# Patient Record
Sex: Female | Born: 1945 | Race: White | Hispanic: No | Smoking: Former smoker
Health system: Southern US, Community
[De-identification: ages and names within clinical notes are randomized; demographics above are authoritative.]

## PROBLEM LIST (undated history)

## (undated) DIAGNOSIS — E079 Disorder of thyroid, unspecified: Secondary | ICD-10-CM

## (undated) DIAGNOSIS — R0609 Other forms of dyspnea: Principal | ICD-10-CM

## (undated) DIAGNOSIS — I6529 Occlusion and stenosis of unspecified carotid artery: Secondary | ICD-10-CM

## (undated) DIAGNOSIS — Z5189 Encounter for other specified aftercare: Secondary | ICD-10-CM

## (undated) DIAGNOSIS — I499 Cardiac arrhythmia, unspecified: Secondary | ICD-10-CM

## (undated) DIAGNOSIS — IMO0001 Reserved for inherently not codable concepts without codable children: Secondary | ICD-10-CM

## (undated) DIAGNOSIS — E039 Hypothyroidism, unspecified: Secondary | ICD-10-CM

## (undated) DIAGNOSIS — M1990 Unspecified osteoarthritis, unspecified site: Secondary | ICD-10-CM

## (undated) DIAGNOSIS — E785 Hyperlipidemia, unspecified: Secondary | ICD-10-CM

## (undated) DIAGNOSIS — I739 Peripheral vascular disease, unspecified: Secondary | ICD-10-CM

## (undated) DIAGNOSIS — K219 Gastro-esophageal reflux disease without esophagitis: Secondary | ICD-10-CM

## (undated) HISTORY — DX: Peripheral vascular disease, unspecified: I73.9

## (undated) HISTORY — DX: Occlusion and stenosis of unspecified carotid artery: I65.29

## (undated) HISTORY — PX: BREAST SURGERY: SHX581

## (undated) HISTORY — PX: CATARACT EXTRACTION: SUR2

## (undated) HISTORY — DX: Other forms of dyspnea: R06.09

## (undated) HISTORY — PX: EYE SURGERY: SHX253

## (undated) HISTORY — DX: Gastro-esophageal reflux disease without esophagitis: K21.9

## (undated) HISTORY — DX: Unspecified osteoarthritis, unspecified site: M19.90

## (undated) HISTORY — DX: Disorder of thyroid, unspecified: E07.9

## (undated) HISTORY — DX: Hyperlipidemia, unspecified: E78.5

## (undated) HISTORY — PX: APPENDECTOMY: SHX54

## (undated) HISTORY — DX: Cardiac arrhythmia, unspecified: I49.9

## (undated) HISTORY — PX: ABDOMINAL HYSTERECTOMY: SHX81

## (undated) HISTORY — PX: HAND SURGERY: SHX662

---

## 2000-02-16 ENCOUNTER — Encounter: Admission: RE | Admit: 2000-02-16 | Discharge: 2000-02-16 | Payer: Self-pay | Admitting: Obstetrics and Gynecology

## 2000-02-16 ENCOUNTER — Encounter: Payer: Self-pay | Admitting: Obstetrics and Gynecology

## 2001-06-06 ENCOUNTER — Encounter: Admission: RE | Admit: 2001-06-06 | Discharge: 2001-06-06 | Payer: Self-pay | Admitting: Obstetrics and Gynecology

## 2001-06-06 ENCOUNTER — Encounter: Payer: Self-pay | Admitting: Obstetrics and Gynecology

## 2001-06-12 ENCOUNTER — Encounter: Admission: RE | Admit: 2001-06-12 | Discharge: 2001-06-12 | Payer: Self-pay | Admitting: Obstetrics and Gynecology

## 2001-06-12 ENCOUNTER — Encounter: Payer: Self-pay | Admitting: Obstetrics and Gynecology

## 2002-06-16 ENCOUNTER — Encounter: Admission: RE | Admit: 2002-06-16 | Discharge: 2002-06-16 | Payer: Self-pay | Admitting: Obstetrics and Gynecology

## 2002-06-16 ENCOUNTER — Encounter: Payer: Self-pay | Admitting: Obstetrics and Gynecology

## 2003-07-16 ENCOUNTER — Ambulatory Visit (HOSPITAL_COMMUNITY): Admission: RE | Admit: 2003-07-16 | Discharge: 2003-07-16 | Payer: Self-pay | Admitting: Cardiology

## 2003-07-29 ENCOUNTER — Ambulatory Visit (HOSPITAL_BASED_OUTPATIENT_CLINIC_OR_DEPARTMENT_OTHER): Admission: RE | Admit: 2003-07-29 | Discharge: 2003-07-29 | Payer: Self-pay | Admitting: Surgery

## 2003-07-29 ENCOUNTER — Encounter (INDEPENDENT_AMBULATORY_CARE_PROVIDER_SITE_OTHER): Payer: Self-pay | Admitting: Specialist

## 2004-11-17 ENCOUNTER — Ambulatory Visit: Payer: Self-pay | Admitting: Endocrinology

## 2004-11-18 ENCOUNTER — Ambulatory Visit: Payer: Self-pay | Admitting: Endocrinology

## 2008-02-17 ENCOUNTER — Encounter: Admission: RE | Admit: 2008-02-17 | Discharge: 2008-02-17 | Payer: Self-pay | Admitting: Family Medicine

## 2008-03-13 ENCOUNTER — Ambulatory Visit: Payer: Self-pay | Admitting: Vascular Surgery

## 2008-03-19 ENCOUNTER — Ambulatory Visit: Payer: Self-pay | Admitting: Surgery

## 2008-03-19 ENCOUNTER — Ambulatory Visit (HOSPITAL_COMMUNITY): Admission: RE | Admit: 2008-03-19 | Discharge: 2008-03-19 | Payer: Self-pay | Admitting: Surgery

## 2008-04-07 ENCOUNTER — Inpatient Hospital Stay (HOSPITAL_COMMUNITY): Admission: RE | Admit: 2008-04-07 | Discharge: 2008-04-09 | Payer: Self-pay | Admitting: Vascular Surgery

## 2008-04-07 ENCOUNTER — Encounter: Payer: Self-pay | Admitting: Vascular Surgery

## 2008-04-08 ENCOUNTER — Encounter: Payer: Self-pay | Admitting: Vascular Surgery

## 2008-04-08 ENCOUNTER — Ambulatory Visit: Payer: Self-pay | Admitting: Surgery

## 2008-04-29 ENCOUNTER — Ambulatory Visit: Payer: Self-pay | Admitting: Vascular Surgery

## 2008-05-08 ENCOUNTER — Ambulatory Visit: Payer: Self-pay | Admitting: Vascular Surgery

## 2008-05-15 ENCOUNTER — Ambulatory Visit: Payer: Self-pay | Admitting: Vascular Surgery

## 2008-08-21 ENCOUNTER — Ambulatory Visit: Payer: Self-pay | Admitting: Vascular Surgery

## 2008-09-28 ENCOUNTER — Ambulatory Visit: Payer: Self-pay | Admitting: Vascular Surgery

## 2008-09-28 ENCOUNTER — Inpatient Hospital Stay (HOSPITAL_COMMUNITY): Admission: RE | Admit: 2008-09-28 | Discharge: 2008-09-30 | Payer: Self-pay | Admitting: Vascular Surgery

## 2008-09-28 ENCOUNTER — Encounter: Payer: Self-pay | Admitting: Vascular Surgery

## 2008-10-09 ENCOUNTER — Ambulatory Visit: Payer: Self-pay | Admitting: Vascular Surgery

## 2008-10-29 ENCOUNTER — Ambulatory Visit: Payer: Self-pay | Admitting: Vascular Surgery

## 2008-11-06 ENCOUNTER — Ambulatory Visit: Payer: Self-pay | Admitting: Vascular Surgery

## 2009-02-05 ENCOUNTER — Ambulatory Visit: Payer: Self-pay | Admitting: Vascular Surgery

## 2009-02-11 ENCOUNTER — Ambulatory Visit (HOSPITAL_COMMUNITY): Admission: RE | Admit: 2009-02-11 | Discharge: 2009-02-11 | Payer: Self-pay | Admitting: Surgery

## 2009-02-11 ENCOUNTER — Ambulatory Visit: Payer: Self-pay | Admitting: Surgery

## 2009-03-02 ENCOUNTER — Inpatient Hospital Stay (HOSPITAL_COMMUNITY): Admission: RE | Admit: 2009-03-02 | Discharge: 2009-03-08 | Payer: Self-pay | Admitting: Vascular Surgery

## 2009-03-02 HISTORY — PX: PR VEIN BYPASS GRAFT,AORTO-FEM-POP: 35551

## 2009-03-17 ENCOUNTER — Ambulatory Visit: Payer: Self-pay | Admitting: Vascular Surgery

## 2009-04-16 ENCOUNTER — Ambulatory Visit: Payer: Self-pay | Admitting: Vascular Surgery

## 2009-07-09 ENCOUNTER — Ambulatory Visit: Payer: Self-pay | Admitting: Vascular Surgery

## 2009-10-08 ENCOUNTER — Ambulatory Visit: Payer: Self-pay | Admitting: Vascular Surgery

## 2010-01-14 ENCOUNTER — Ambulatory Visit: Payer: Self-pay | Admitting: Vascular Surgery

## 2010-09-07 ENCOUNTER — Ambulatory Visit: Payer: Self-pay | Admitting: Vascular Surgery

## 2011-02-07 LAB — BASIC METABOLIC PANEL
BUN: 11 mg/dL (ref 6–23)
BUN: 2 mg/dL — ABNORMAL LOW (ref 6–23)
BUN: 6 mg/dL (ref 6–23)
CO2: 29 mEq/L (ref 19–32)
Calcium: 6.7 mg/dL — ABNORMAL LOW (ref 8.4–10.5)
Chloride: 106 mEq/L (ref 96–112)
Creatinine, Ser: 0.74 mg/dL (ref 0.4–1.2)
Creatinine, Ser: 0.8 mg/dL (ref 0.4–1.2)
GFR calc Af Amer: >60 mL/min (ref >60)
GFR calc non Af Amer: >60 mL/min (ref >60)
GFR calc non Af Amer: >60 mL/min (ref >60)
Glucose, Bld: 125 mg/dL — ABNORMAL HIGH (ref 70–99)
Glucose, Bld: 251 mg/dL — ABNORMAL HIGH (ref 70–99)
Glucose, Bld: 93 mg/dL (ref 70–99)
Potassium: 3.8 mEq/L (ref 3.5–5.1)
Potassium: 4.6 mEq/L (ref 3.5–5.1)
Sodium: 138 mEq/L (ref 135–145)

## 2011-02-07 LAB — POCT I-STAT 7, (LYTES, BLD GAS, ICA,H+H)
Acid-base deficit: 4 mmol/L — ABNORMAL HIGH (ref 0.0–2.0)
Bicarbonate: 22 mEq/L (ref 20.0–24.0)
Calcium, Ion: 1.06 mmol/L — ABNORMAL LOW (ref 1.12–1.32)
Calcium, Ion: 1.07 mmol/L — ABNORMAL LOW (ref 1.12–1.32)
HCT: 28 % — ABNORMAL LOW (ref 36.0–46.0)
Hemoglobin: 9.5 g/dL — ABNORMAL LOW (ref 12.0–15.0)
Hemoglobin: 9.9 g/dL — ABNORMAL LOW (ref 12.0–15.0)
O2 Saturation: 100 %
O2 Saturation: 100 %
Patient temperature: 35.4
Patient temperature: 35.9
Potassium: 4.6 mEq/L (ref 3.5–5.1)
Potassium: 4.9 mEq/L (ref 3.5–5.1)
Sodium: 130 mEq/L — ABNORMAL LOW (ref 135–145)
Sodium: 135 mEq/L (ref 135–145)
TCO2: 23 mmol/L (ref 0–100)
pCO2 arterial: 37.9 mmHg (ref 35.0–45.0)
pCO2 arterial: 42.9 mmHg (ref 35.0–45.0)
pH, Arterial: 7.338 — ABNORMAL LOW (ref 7.350–7.400)
pH, Arterial: 7.365 (ref 7.350–7.400)
pO2, Arterial: 460 mmHg — ABNORMAL HIGH (ref 80.0–100.0)

## 2011-02-07 LAB — COMPREHENSIVE METABOLIC PANEL
AST: 27 U/L (ref 0–37)
Alkaline Phosphatase: 38 U/L — ABNORMAL LOW (ref 39–117)
BUN: 10 mg/dL (ref 6–23)
BUN: 14 mg/dL (ref 6–23)
CO2: 23 mEq/L (ref 19–32)
CO2: 24 mEq/L (ref 19–32)
Calcium: 9.1 mg/dL (ref 8.4–10.5)
Chloride: 103 mEq/L (ref 96–112)
Chloride: 104 mEq/L (ref 96–112)
Creatinine, Ser: 0.76 mg/dL (ref 0.4–1.2)
GFR calc Af Amer: >60 mL/min (ref >60)
GFR calc non Af Amer: >60 mL/min (ref >60)
GFR calc non Af Amer: >60 mL/min (ref >60)
Glucose, Bld: 110 mg/dL — ABNORMAL HIGH (ref 70–99)
Glucose, Bld: 189 mg/dL — ABNORMAL HIGH (ref 70–99)
Potassium: 4.1 mEq/L (ref 3.5–5.1)
Total Bilirubin: 0.4 mg/dL (ref 0.3–1.2)
Total Bilirubin: 0.6 mg/dL (ref 0.3–1.2)

## 2011-02-07 LAB — BLOOD GAS, ARTERIAL
Acid-base deficit: 1.2 mmol/L (ref 0.0–2.0)
Acid-base deficit: 3.2 mmol/L — ABNORMAL HIGH (ref 0.0–2.0)
Bicarbonate: 22.7 mEq/L (ref 20.0–24.0)
Drawn by: 249101
O2 Content: 4 L/min
TCO2: 23.9 mmol/L (ref 0–100)
pCO2 arterial: 36.5 mmHg (ref 35.0–45.0)
pCO2 arterial: 43.6 mmHg (ref 35.0–45.0)
pH, Arterial: 7.412 — ABNORMAL HIGH (ref 7.350–7.400)
pO2, Arterial: 64.4 mmHg — ABNORMAL LOW (ref 80.0–100.0)

## 2011-02-07 LAB — GLUCOSE, CAPILLARY: Glucose-Capillary: 111 mg/dL — ABNORMAL HIGH (ref 70–99)

## 2011-02-07 LAB — CBC
HCT: 27 % — ABNORMAL LOW (ref 36.0–46.0)
HCT: 30.7 % — ABNORMAL LOW (ref 36.0–46.0)
HCT: 34 % — ABNORMAL LOW (ref 36.0–46.0)
HCT: 35 % — ABNORMAL LOW (ref 36.0–46.0)
HCT: 41 % (ref 36.0–46.0)
Hemoglobin: 11.9 g/dL — ABNORMAL LOW (ref 12.0–15.0)
Hemoglobin: 12.3 g/dL (ref 12.0–15.0)
Hemoglobin: 14.3 g/dL (ref 12.0–15.0)
Hemoglobin: 9.3 g/dL — ABNORMAL LOW (ref 12.0–15.0)
MCHC: 34.6 g/dL (ref 30.0–36.0)
MCHC: 34.9 g/dL (ref 30.0–36.0)
MCHC: 35 g/dL (ref 30.0–36.0)
MCV: 87.6 fL (ref 78.0–100.0)
MCV: 88.7 fL (ref 78.0–100.0)
MCV: 89.7 fL (ref 78.0–100.0)
MCV: 90.1 fL (ref 78.0–100.0)
Platelets: 141 10*3/uL — ABNORMAL LOW (ref 150–400)
Platelets: 234 10*3/uL (ref 150–400)
Platelets: 97 10*3/uL — ABNORMAL LOW (ref 150–400)
RBC: 3.45 MIL/uL — ABNORMAL LOW (ref 3.87–5.11)
RBC: 4.62 MIL/uL (ref 3.87–5.11)
RDW: 14 % (ref 11.5–15.5)
RDW: 14.5 % (ref 11.5–15.5)
RDW: 14.5 % (ref 11.5–15.5)
RDW: 14.5 % (ref 11.5–15.5)
WBC: 10.3 10*3/uL (ref 4.0–10.5)
WBC: 11.2 10*3/uL — ABNORMAL HIGH (ref 4.0–10.5)
WBC: 13.2 10*3/uL — ABNORMAL HIGH (ref 4.0–10.5)
WBC: 7.6 10*3/uL (ref 4.0–10.5)

## 2011-02-07 LAB — MAGNESIUM: Magnesium: 1.4 mg/dL — ABNORMAL LOW (ref 1.5–2.5)

## 2011-02-07 LAB — URINALYSIS, ROUTINE W REFLEX MICROSCOPIC
Bilirubin Urine: NEGATIVE
Ketones, ur: NEGATIVE mg/dL
Nitrite: NEGATIVE
Protein, ur: NEGATIVE mg/dL
pH: 7 (ref 5.0–8.0)

## 2011-02-07 LAB — TYPE AND SCREEN

## 2011-02-07 LAB — APTT: aPTT: 25 seconds (ref 24–37)

## 2011-02-07 LAB — PROTIME-INR
INR: 0.9 (ref 0.00–1.49)
Prothrombin Time: 12.4 seconds (ref 11.6–15.2)

## 2011-02-08 LAB — POCT I-STAT, CHEM 8
BUN: 16 mg/dL (ref 6–23)
Calcium, Ion: 1.17 mmol/L (ref 1.12–1.32)
Chloride: 106 mEq/L (ref 96–112)
HCT: 45 % (ref 36.0–46.0)
Sodium: 139 mEq/L (ref 135–145)
TCO2: 26 mmol/L (ref 0–100)

## 2011-03-14 NOTE — Assessment & Plan Note (Signed)
OFFICE VISIT   [name] [name], [name] [name]  DOB:  09-Sep-1946                                       02/05/2009  VHQIO#:[id number]   The patient presents today for continued follow-up of her lower  extremity arterial insufficiency.  She initially underwent arteriography  in May 2009.  She subsequently underwent bilateral femoral  endarterectomy with Dacron patch angioplasty in June 2009.  She had poor  results with this.  She went on to occlusion of her left common femoral  artery and was taken back to the operating room in November 2009 where  she underwent left common femoral artery resection followed by an  external iliac artery to profunda femoris bypass with a 6-mm thin-wall  Gore-Tex graft.  She continues to have limiting claudication.  She  denies any rest pain and she does not have any tissue loss.   She on physical exam is a well-developed, well-nourished white female  appearing stated age of 89.  Her blood pressure is 106/59, pulse 81,  respirations 18.  Her radial pulses are 2+.  Abdominal:  Reveals no  tenderness and no masses.  She does not have any prior abdominal scars.  She does have well-healed incisions in both groins with markedly  diminished femoral pulses.  She has absent distal pulses and no tissue  loss.  She is not able to tolerate her current level of claudication.  She reports she is usually quite active and is unable to walk or do her  things that she enjoys.  I explained that we would need to repeat her  arteriogram to determine best treatment options.  I explained that she  obviously had early failure of the femoral procedures.  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  I explained that  following arteriography we would recommend treatment, but that this may  include either aortobifemoral bypass grafting or staged bilateral  femoral-to-popliteal  bypasses.  She understands this and wishes to  proceed with elective arteriogram followed by elective surgery.  We have  scheduled her arteriogram with Dr. Durene Cal on 02/11/2009 at [facility].   Larina Earthly, M.D.  Electronically Signed   TFE/MEDQ  D:  02/05/2009  [name]:  02/08/2009  Job:  [id number]   cc:   Quita Skye. Artis Flock, M.D.

## 2011-03-14 NOTE — Discharge Summary (Signed)
[name] [name], [name] [name]                  ACCOUNT NO.:  [id number]   MEDICAL RECORD NO.:  [id number]          PATIENT TYPE:  INP   LOCATION:  2021                         FACILITY:  [facility]   PHYSICIAN:  Larina Earthly, M.D.    DATE OF BIRTH:  Apr 03, 1946   DATE OF ADMISSION:  04/07/2008  DATE OF DISCHARGE:  04/09/2008                               DISCHARGE SUMMARY   DISCHARGE DIAGNOSIS:  Bilateral lower extremity claudication, left  greater than right.   PROCEDURE PERFORMED:  Bilateral femoral endarterectomies with Dacron  patch angioplasty on April 07, 2008.   COMPLICATIONS:  None.   DISCHARGE MEDICATIONS:  1. Synthroid 100 mcg p.o. daily.  2. Xanax 0.25 mg 1/2 tablet p.o. daily.  3. Percocet 5/325 1 p.o. q.4 h. p.r.n. pain.   DISPOSITION:  She is being discharged home in stable condition with her  wound feeling well.  She was instructed to clean her wounds with soap  and warm water.  She may increase her activity slowly.  She may walk  with assistance.  She may drive after 3 weeks.  She is to see Dr. Arbie Cookey  in 3 weeks for follow-up with ABIs and wound check.   CONDITION ON DISCHARGE:  Stable, improving.   BRIEF IDENTIFYING:  For statements of complete details, please refer to  typed history and physical.  Briefly, this very pleasant 65 year old  woman was referred to Dr. Arbie Cookey for bilateral lower extremity  claudication.  Dr. Arbie Cookey found her to have stenosis of her common  femoral arteries bilaterally.  He recommended endarterectomy.  She was  informed of the risks and benefits of the procedure and after careful  consideration, elected to proceed with surgery.   HOSPITAL COURSE:  Preoperative workup was completed as an outpatient.  She was brought in through same-day surgery and underwent the  aforementioned revascularization procedure.  For complete details,  please refer to the typed operative report.  The procedure was without  complication.  She was returned to the Post  Anesthesia Care Unit  extubated.  Following stabilization, she was transferred to a bed on a  surgical convalescent floor.   The rest of her hospital course was benign.  She was walking and  improving with physical therapy.  The wounds were healing well.  She was  felt stable and discharged home on April 09, 2008.      Wilmon Arms, PA      Larina Earthly, M.D.  Electronically Signed    KEL/MEDQ  D:  04/09/2008  T:  04/10/2008  Job:  [id number]

## 2011-03-14 NOTE — Assessment & Plan Note (Signed)
OFFICE VISIT   ANELY, SPIEWAK T  DOB:  1945-11-21                                       03/17/2009  BJYNW#:[id number]   The patient presents today for followup of her aortobifemoral bypass and  bilateral femoral-popliteal bypasses with Gore-Tex graft, all done on  03/02/2009 for bilateral lower extremity arterial insufficiency and  severe claudication.  She did well in the hospital and is here today for  followup.  She had had some serous drainage discharge from her right  groin and this has now resolved.  Her abdominal, femoral and popliteal  incisions are all healing quite nicely.  They are all closed with  subcuticular closure.  She has 2-3+ dorsalis pedis pulses bilaterally.  She does have slow return of appetite and is having normal return of her  bowel function.  I am pleased with her initial followup and plan to see  her again in 1 month for continued followup.   Larina Earthly, M.D.  Electronically Signed   TFE/MEDQ  D:  03/17/2009  T:  03/18/2009  Job:  [id number]   cc:   Quita Skye. Artis Flock, M.D.

## 2011-03-14 NOTE — Procedures (Signed)
BYPASS GRAFT EVALUATION   INDICATION:  Bilateral bypass grafts.   HISTORY:  Diabetes:  No.  Cardiac:  No.  Hypertension:  No.  Smoking:  Previous.  Previous Surgery:  Aortobifem and bilateral fem-pop bypass grafts on  03/02/09.   SINGLE LEVEL ARTERIAL EXAM                               RIGHT              LEFT  Brachial:                    172                170  Anterior tibial:             176                198  Posterior tibial:            182                198  Peroneal:  Ankle/brachial index:        1.06               1.15   PREVIOUS ABI:  Date: 07/09/09  RIGHT:  1.18  LEFT:  1.25   LOWER EXTREMITY BYPASS GRAFT DUPLEX EXAM:   DUPLEX:  Biphasic Doppler waveforms noted throughout the bilateral lower  extremity bypass grafts and their native vessels with no increase in  velocities.   IMPRESSION:  1. Patent bilateral femoropopliteal bypass grafts with no evidence of      stenosis.  2. Stable bilateral ankle brachial indices.   ___________________________________________  Larina Earthly, M.D.   CH/MEDQ  D:  10/08/2009  T:  10/09/2009  Job:  [id number]

## 2011-03-14 NOTE — Assessment & Plan Note (Signed)
OFFICE VISIT   Elkton, COHICK T  DOB:  19-Apr-1946                                       11/06/2008  EAVWU#:[id number]   Patient presents today for continued followup of her lower extremity  arterial insufficiency.  She has now healed her left groin incision.  She does continue to have bilateral lower extremity claudication,  perhaps somewhat worse on the right than on the left.  She does not have  any true buttock claudication.   PHYSICAL EXAMINATION:  Both groin incisions are well healed.  She did  have a Vicryl suture at the lower pole of her right leg, and I have  removed this.  She does not have palpable distal pulses.  She does have  known superficial femoral artery occlusions bilaterally.   I discussed the options with patient.  I explained that as I had in the  past, that her next treatment would be re-arteriography and probable  aortobifemoral bypass grafting.  She reports that she is not happy with  her current level of claudication but currently is not having any limb-  threatening problems and wishes to continue on observation only.  I plan  to see her again in 3 months for continued discussion.   Larina Earthly, M.D.  Electronically Signed   TFE/MEDQ  D:  11/06/2008  T:  11/09/2008  Job:  [id number]

## 2011-03-14 NOTE — Procedures (Signed)
CAROTID DUPLEX EXAM   INDICATION:  Left carotid bruit.   HISTORY:  Diabetes:  No.  Cardiac:  No.  Hypertension:  No.  Smoking:  Quit about seven years ago.  Previous Surgery:  Bilateral carotid endarterectomy.  CV History:  Amaurosis Fugax No, Paresthesias No, Hemiparesis No.                                       RIGHT             LEFT  Brachial systolic pressure:         130               140  Brachial Doppler waveforms:         Biphasic          Biphasic  Vertebral direction of flow:        Antegrade         Antegrade  DUPLEX VELOCITIES (cm/sec)  CCA peak systolic                   85                52  ECA peak systolic                   154               Occluded  ICA peak systolic                   168               146  ICA end diastolic                   32                35  PLAQUE MORPHOLOGY:                  Calcified         Calcified  PLAQUE AMOUNT:                      Moderate          Moderate  PLAQUE LOCATION:                    ICA, ECA          ICA, ECA    IMPRESSION:  1. 40-59% stenosis noted in bilateral internal carotid arteries.  2. Antegrade bilateral vertebral arteries.     ___________________________________________  Larina Earthly, M.D.   MG/MEDQ  D:  05/15/2008  T:  05/15/2008  Job:  [id number]

## 2011-03-14 NOTE — Assessment & Plan Note (Signed)
OFFICE VISIT   [name] [name], [name] [name]  DOB:  03/21/1946                                       05/15/2008  ZOXWR#:[id number]   The patient presents today for follow-up of her recent bilateral femoral  endarterectomy with Dacron patch angioplasty for limiting claudication.  She has had no difficulty with her incisions, things are feeling quite  nicely, she did have exposed Vicryl suture and this was removed  bilaterally.  She reports that she has had resolution of claudication in  her right leg, she still continues to have some calf claudication on the  left and some hip discomfort as well.  She is walking much better and  will continue to monitor this.  She does have concern regarding possible  other peripheral vascular occlusive disease.  She does have a soft left  carotid bruit, no bruit on the right and did undergo carotid duplex  today.  This shows occlusion of her external carotid on the left and 40-  59% stenosis in her bilateral internal carotid arteries.  I discussed  this with this with the patient, explained there is no significance of  her external carotid occlusion of the left and that we would recommend  yearly monitoring of her internal carotid artery stenosis.  I reviewed  symptoms of carotid disease with her and she will notify the  should she  have any symptoms.  Otherwise, we will see her in 3 months for follow-up  of her endarterectomy of her bilateral common femoral artery and then  yearly for duplex follow-up.   Larina Earthly, M.D.  Electronically Signed   TFE/MEDQ  D:  05/15/2008  [name]:  05/18/2008  Job:  [id number]   cc:   Quita Skye. Artis Flock, M.D.

## 2011-03-14 NOTE — Assessment & Plan Note (Signed)
OFFICE VISIT   [name] [name], [name] [name]  DOB:  30-Jan-1946                                       07/09/2009  AOZHY#:[id number]   The patient presents today for followup of her extensive prior surgery.  In May of 2010 she underwent aortobifemoral bypass and bilateral femoral  to above knee popliteal bypass with Propaten Gore-Tex graft.  She has  done extremely well following this.  She reports that she is walking  without any difficulty whatsoever and just recently took her grandson to  [place].   PHYSICAL EXAMINATION:  On physical examination her blood pressure is  116/59, pulse 70, respirations 18.  Her abdominal incision is well-  healed.  She does have somewhat of a hypertrophic scar and I explained  that this will turn from the pink color of a relatively fresh incision  to white and it will be much less noticeable.  Her popliteal incisions  and groin incisions are healed with 2+ femoral and popliteal and 2 to 3+  dorsalis pedis pulses bilaterally.   She underwent noninvasive vascular laboratory studies in our office and  these reveal normal ankle arm indices bilaterally up from 0.43 on the  right and 0.34 on the left.  She is quite pleased with her result as am  I.  We will continue to follow her in the noninvasive vascular  laboratory for continued followup.  She will see [place] and she will notify  should she develop any new difficulty.   Larina Earthly, M.D.  Electronically Signed   TFE/MEDQ  D:  07/09/2009  T:  07/09/2009  Job:  [id number]   cc:   Larina Earthly, M.D.

## 2011-03-14 NOTE — Op Note (Signed)
NAMEESMIRNA, RAVAN                  ACCOUNT NO.:  [id number]   MEDICAL RECORD NO.:  [id number]          PATIENT TYPE:  INP   LOCATION:  2309                         FACILITY:  [facility]   PHYSICIAN:  Larina Earthly, M.D.    DATE OF BIRTH:  1946/01/12   DATE OF PROCEDURE:  03/02/2009  DATE OF DISCHARGE:                               OPERATIVE REPORT   PREOPERATIVE DIAGNOSIS:  Bilateral lower extremity arterial  insufficiency.   POSTOPERATIVE DIAGNOSIS:  Bilateral lower extremity arterial  insufficiency.   PROCEDURES:  1. Aortobifemoral bypass with 12 x 7 Hemashield Dacron graft.  2. Bilateral femoral to above-knee popliteal artery bypasses with 6 mm      ringed Propaten Gore-Tex graft.   SURGEON:  Larina Earthly, MD   ASSISTANT:  Jerold Coombe, PA-C   ANESTHESIA:  General endotracheal.   COMPLICATIONS:  None.   DISPOSITION:  To recovery room, stable.   INDICATIONS FOR THE PROCEDURE:  The patient is an active, pleasant 65-  year-old white female with a long history of lower extremity arterial  insufficiency.  She had limiting claudication and had undergone  arteriography in May 2009, showing superficial femoral artery  occlusions, but also bilateral common femoral artery stenoses.  It was  recommended that she undergo common femoral endarterectomies for  attempted improvement of her claudication.  She did have a small  aortoiliac segments, at that time these were patent.  She underwent  bilateral endarterectomies and Dacron patches in June 2009.  She had  poor results with this, with a short-lived improvement in her  claudication.  In November, she was found have a markedly diminished  pulse in her left groin, was taken back to the operating room.  At that  time, she was found to have occlusion of the prior endarterectomy site  with intimal hyperplasia.  She had poor inflow and actually had  dissection and exposure of her external iliac well upon the inguinal  ligament.   She had a very small iliac artery and had a Gore-Tex graft  from her external iliac to a small profunda femoris artery.  She  continued to have progressive claudication and is unable to tolerate  this level.  She underwent recent repeat arteriography again showing  patent aortoiliac segment on the right and an occluded external iliac on  the left.  The arteries were quite small, and she had recurrent  narrowing at the common femoral on the right.  She does have a chronic  SFA occlusions with reconstitution of the above-knee popliteal artery  with normal popliteal and tibial vessels.  Her claudication was worse on  the left, but it is also significant on the right.  It was recommended  that she undergo aortobifemoral bypass for improvement of in flow since  this does appear to be a limiting issue and also I had recommended a  left femoral to popliteal bypass due to poor run off through the  profunda branch.  We had discussed the possible need for an eventual  right fem-pop bypass as well, but I had planned on  reserving this.   PROCEDURE IN DETAIL:  The patient was taken to the operating room,  placed in the supine position.  The area of the abdomen, both groins,  both legs, prepped and draped in usual sterile fashion.  Both incisions  over the common femoral arteries were reopened and carried down to  isolate the Gore-Tex graft on the left.  This was from up under the  inguinal ligament down to the anastomosis to the profunda.  The patient  did have some old profunda femoral artery run off.  On the right, the  prior Dacron patch was exposed and the common femoral, superficial  femoral, and profunda femoris arteries were isolated.  The patient had  extremely small profunda run off and also a small common femoral artery.  Next, the incision was made from the level of xiphoid to below the  umbilicus and carried down to the midline fascia with electrocautery.  Abdomen was entered and  Omni-Tract retractor was used for exposure.  The  transverse colon and omentum reflected superiorly and the small bowel  was selected to the right.  The duodenum was mobilized off the aorta.  The patient had a small caliber aorta, but had only mild-to-moderate  atherosclerotic changes.  The artery was then circled.  The aorta was  encircled at the level of the left renal vein.  Dissection was carried  down to the take off of the inferior mesenteric artery, and this was  exposed and the aorta was exposed at that area as well.  Next, the  bifurcation was exposed and a plane anterior to the iliac vessel was  taken, taking care to pass behind the level of the ureters bilaterally  down towards the groin.  Tunnels were created from the groin to the  aortic bifurcation bilaterally and umbilical tapes were passed through  these tunnels.  The patient was given 7000 units of heparin.  After  adequate circulation time, the aorta was occluded below the level of the  renal arteries with a Harken clamp.  The aorta was occluded just above  the inferior mesenteric artery with aortic DeBakey clamp.  The aorta was  transected above the aorta with a Bakey clamp.  The distal aorta was  oversewn with 3-0 Prolene suture using felt strips for reinforcement.  A  section of aorta was resected to give adequate space for an end-to-end  anastomosis of the proximal aorta.  A 12 x 7 Hemashield Dacron graft was  brought on to the field.  Using a felt strip for reinforcement, the  graft was sewn end-to-end to the aorta with a running 3-0 Prolene  suture.  The anastomosis was tested and found to be adequate.  Right and  left limbs were brought to the respective groins.  Attention was turned  first to the right groin.  The common femoral artery was occluded.  The  prior Dacron patch was reopened and sent down to the profunda femoris  artery.  The artery was a small caliber being less than 3 mm in  diameter.  There was back  bleeding.  The graft was cut to appropriate  length and was sewn end-to-end to the junction of the old patch, then in  the toe was taken down on to the profunda femoris artery.  Prior to  completion of the anastomosis, the usual flushing maneuvers were  undertaken.  Anastomosis was completed.  Due to the very poor quality  outflow of the profunda, the decision was made  to proceed with a fem-pop  bypass on the right as well.  On the left, the old interposition graft  of Gore-Tex was ligated proximally.  The graft had been sewn down to a  branch going in the profunda and both of these branches were controlled.  The artery had to be opened further down onto the profunda to find a  patent artery.  The left limb of the graft was sewn end-to-end to the  profundus femoris artery with a running 6-0 Prolene suture.  Again, the  usual flushing maneuvers were undertaken prior to completion of  anastomosis.  The anastomosis was completed and flow was restored to the  left leg.  Next, the left above-knee popliteal artery had already been  exposed through an incision at the medial approach.  The artery was of  good caliber with minimal calcification.  A tunnel had been created from  this part to the groin.  A 6 mm ringed thin wall Propaten Gore-Tex graft  was brought through the tunnel.  The proximal aortofemoral graft was  reoccluded and the graft was occluded just above the profunda  anastomosis.  No ellipse of graft was removed from the left limb of the  aorta femoral Dacron graft.  The Propaten graft was spatulated and sewn  end-to-side to the graft just above the profunda anastomosis with a  running 6-0 Prolene suture.  Anastomosis was tested and found to be  adequate.  The graft was brought through the prior created tunnel.  The  above-knee popliteal artery was occluded proximally and distally and was  opened with 11 blade and extended longitudinally with Potts scissors.  The graft was cut to the  appropriate length and was sewn end-to-side to  the above-knee popliteal artery with a running 6-0 Prolene suture.  Prior to completion of the anastomosis, the usual flushing maneuvers  were undertaken.  Anastomosis was completed with excellent Doppler  signal in the foot, which was dependent on the Gore-Tex graft.  Finally  a separate incision was made over the medial approach to the right above-  knee popliteal artery.  The artery was smaller than the left artery, but  was patent.  A tunnel was created from the level of the above-knee  popliteal artery to the level of the groin.  A 6-mm Propaten graft was  brought on to the field, again the limb of the graft of the right limb  of the aortofemoral graft was occluded proximally and distally and  ellipse was removed above the distal anastomosis.  The 6-mm Propaten  Gore-Tex graft was spatulated, and sewn end-to-side to the distal  aortofemoral graft with a running 6-0 Prolene suture.  The anastomosis  was tested and found to be adequate.  The graft was brought to the prior  created then to the level of above-knee popliteal artery.  The artery  was occluded proximally and distally and was opened with 11 blade,  extended with longitudinally with Potts scissors.  The Gore-Tex graft  was cut to the appropriate length, spatulated and sewn end-to-side of  the artery with a running 6-0 Prolene suture.  Three dilator passed  without resistance to the distal anastomosis.  Anastomosis completed,  now restoring flow to the right and left leg.  The patient had been  given 2 additional dosages of heparin during the procedure, and this was  then reversed with 100 units of protamine.  The wounds were irrigated  with saline.  The patient had excellent signals in  both feet with both  Propaten grafts patent.  The wounds were irrigated with saline.  Hemostasis with electrocautery.  The groin and popliteal wounds were  closed with several layers of 2-0 Vicryl  and the subcuticular tissue and  the skin was closed with 3-0 subcuticular Vicryl stitch.  The aortic  anastomosis was again exposed and found to be adequate.  The  retroperitoneum was closed to exclude the aortic graft with a running 2-  0 Vicryl suture.  The small bowel was run in its entirety and found to  be without injury, it was placed back in the pelvis.  The transverse  colon and omentum were placed over this.  The midline fascia was closed  with a #1 PDS suture beginning proximally and distally and tying in the  middle.  The skin was closed with 3-0 subcuticular Vicryl stitch.  Sterile dressing was applied, and the patient was taken to the recovery  room in stable condition.      Larina Earthly, M.D.  Electronically Signed     TFE/MEDQ  D:  03/02/2009  T:  03/03/2009  Job:  [id number]   cc:   Quita Skye. Artis Flock, M.D.

## 2011-03-14 NOTE — Procedures (Signed)
BYPASS GRAFT EVALUATION   INDICATION:  Bilateral lower extremity bypass grafts, patient states  claudication has vanished.   HISTORY:  Diabetes:  No.  Cardiac:  No.  Hypertension:  No.  Smoking:  Previous.  Previous Surgery:  Bilateral femoral-popliteal artery bypass grafts and  aortobifem 03/02/2009 by Dr. Arbie Cookey.   SINGLE LEVEL ARTERIAL EXAM                               RIGHT              LEFT  Brachial:                    154                159  Anterior tibial:             173                199  Posterior tibial:            187                188  Peroneal:  Ankle/brachial index:        1.18               1.25   PREVIOUS ABI:  Date:  10/09/2008  RIGHT:  0.43  LEFT:  0.34   LOWER EXTREMITY BYPASS GRAFT DUPLEX EXAM:   DUPLEX:  Doppler arterial waveforms appear triphasic proximal to, within  and distal to bilateral femoral-popliteal artery bypass grafts with no  evidence of stenosis.   IMPRESSION:  1. Patent bilateral femoral-popliteal artery bypass grafts.  2. Ankle brachial indices show significant improvement bilaterally      from preop study.        ___________________________________________  Larina Earthly, M.D.   AS/MEDQ  D:  07/09/2009  T:  07/09/2009  Job:  [id number]

## 2011-03-14 NOTE — Op Note (Signed)
NAMEDONNAMAE, MUILENBURG                  ACCOUNT NO.:  [id number]   MEDICAL RECORD NO.:  [id number]          PATIENT TYPE:  INP   LOCATION:  2011                         FACILITY:  [facility]   PHYSICIAN:  Larina Earthly, M.D.    DATE OF BIRTH:  15-Nov-1945   DATE OF PROCEDURE:  DATE OF DISCHARGE:                               OPERATIVE REPORT   PREOPERATIVE DIAGNOSIS:  Progressive claudication left leg.   POSTOPERATIVE DIAGNOSIS:  Progressive claudication left leg.   PROCEDURE:  Left common femoral artery resection followed by a left  external iliac artery to left profunda femoris artery bypass with 6 mm  thin wall Gore-Tex graft.   SURGEON:  Larina Earthly, MD   ASSISTANT:  Wilmon Arms, PA-C   ANESTHESIA:  General endotracheal.   COMPLICATIONS:  None.   DISPOSITION:  To recovery room stable.   INDICATION FOR PROCEDURE:  The patient is a 65 year old white female  with a history of prior lower extremity arterial insufficiency and  claudication.  She had undergone arteriography in February 2009 which  revealed subtotal occlusion of her right common femoral artery and  occlusion of her left common femoral artery.  She also had a complete  occlusion of her superficial femoral arteries bilaterally with above-  knee reconstitution from profunda collaterals and three-vessel runoff.  Options have been discussed at that time.  She did have intolerable  claudication and on June 2009 underwent bilateral femoral  endarterectomies and Dacron patch angioplasties.  The option of staged  endarterectomy and fem-pop had been discussed and was felt with  improvement of flow into her profunda collaterals that she would have  relieve her claudication.  She did well with this procedure and  initially had no recurrent claudication symptoms.  She has, however,  presented with recurrent claudication.  Discussed options with Ms. Staib.  She reports this was much worse on the left than on the right.  On  physical exam, she did seem to have 2+ femoral pulses and I had  recommended that we proceed with left fem-pop bypass.  I discussed the  option of repeating her arteriogram since this was just done on February  2009 and felt we could proceed without repeat arteriography.   PROCEDURE IN DETAIL:  The patient was taken to operating room and placed  in supine position where the left groin and left leg were prepped and  draped in the usual sterile fashion.  The saphenous vein was imaged with  ultrasound in room and this was found to be adequate for bypass.  The  location of the saphenous vein was marked on the skin.  A prior groin  incision was reopened and there was no femoral pulse.  The dissection  was extended deep down to the femoral graft and there was a dense amount  of scarring in the left groin.  The superficial femoral and profunda  femoris arteries were identified as was the common femoral artery.  The  prior Dacron patch was identified as well.  The patch was rather  constricted upon itself and there was  no flow in the common femoral  artery.  Dissection was extended up under the inguinal ligament.  The  patient did have a small external iliac artery as they had before and  there was no significant pulse in the external iliac artery even high  under the inguinal ligament above the patch.  A consideration for fem-  fem was undertaken but this was not felt to be a very attractive option  since she had already had right femoral endarterectomy and similar small  iliac artery on the right.  The patient was given 7000 units of  intravenous heparin.  The patch was opened and the common femoral artery  was completely occluded.  This extended up under the inguinal ligament  to the area of the external iliac and there was some old thrombus  present.  A 4 Fogarty catheter was able to be passed up into the level  of the aorta and old thrombus removed.  After a negative pass, this was  occluded  to high under the inguinal ligament with a Cooley clamp.  There  was adequate inflow.  The dissection was then extended down onto the  superficial femoral and profunda femoris artery.  The superficial  femoral artery was chronically occluded and it was ligated and divided.  Dissection was extended down onto the profunda femoris artery and there  was excellent backbleeding from the profundus femoris artery and the  artery was of good caliber.  Decision was made to resect the occluded  common femoral artery with an interposition graft of Gore-Tex.  The  patient had a small iliac artery and a 6-mm thin wall graft was chosen.  The graft was spatulated and sewn end-to-end to the external iliac  artery with a running 6-0 Prolene suture.  Several additional sutures  were required for hemostasis.  The graft itself was flushed with  heparinized saline and reoccluded.  Next, the graft was brought into  approximation with the profunda femoris artery.  The graft was  spatulated and sewn end-to-end to the profunda with a running 6-0  Prolene suture.  Prior to completion of the anastomosis, the usual  flushing maneuvers were undertaken.  Anastomosis was completed and flow  was restored to the profunda femoris artery.  Excellent Doppler flow was  noted through the profunda, also graft dependent Doppler flow was noted  at the ankle and the posterior tibial and anterior tibial arteries.  The  patient was given 50 mg of protamine to reverse the heparin.  Decision  was made not to proceed with a concomitant femoral-popliteal bypass  since her problem was clearly inflow currently.  The patient was given  50 mg of protamine.  The wounds were irrigated with saline.  Hemostasis  achieved with electrocautery.  The wounds were closed with several  layers of 2-0 Vicryl in the subcutaneous tissue and the skin was closed  with a 3-0 subcuticular Vicryl stitch.  Sterile dressing was applied.  The patient was taken to  the recovery room in stable condition.      Larina Earthly, M.D.  Electronically Signed     TFE/MEDQ  D:  09/28/2008  T:  09/28/2008  Job:  [id number]   cc:   Quita Skye. Artis Flock, M.D.

## 2011-03-14 NOTE — Op Note (Signed)
NAME:  [name] [name], [name] [name]                  ACCOUNT NO.:  [id number]   MEDICAL RECORD NO.:  [id number]          PATIENT TYPE:  AMB   LOCATION:  SDS                          FACILITY:  [facility]   PHYSICIAN:  Juleen China IV, MDDATE OF BIRTH:  1946-03-12   DATE OF PROCEDURE:  02/11/2009  DATE OF DISCHARGE:  02/11/2009                               OPERATIVE REPORT   PREPROCEDURE DIAGNOSIS:  Bilateral claudication.   POSTPROCEDURE DIAGNOSIS:  Bilateral claudication.   PROCEDURE PERFORMED:  1. Ultrasound access left brachial artery.  2. Abdominal aortogram.  3. Bilateral lower extremity runoff.   INDICATIONS:  This is a 65 year old female who has previously undergone  bilateral femoral endarterectomies.  She was taken back to the operating  room and underwent a left external iliac to profunda bypass graft.  She  is having lifestyle-limiting claudication.  She comes in for  arteriogram.   PROCEDURE:  The patient was identified in the holding area and taken to  room H, was placed supine on the table.  Bilateral groins were prepped  and draped in standard sterile fashion.  A time-out was called.  Ultrasound access was attempted in both groin;, however, was then able  to successfully access the artery.  With that reason, the left brachial  artery was prepped and draped.  The brachial artery was accessed under  ultrasound guidance with a micropuncture needle.  This was exchanged out  over a Bentson wire for a 4-French sheath.  Using a pigtail catheter and  Bentson wire, catheter was advanced into the aorta.  It was placed at  the level of L1 and abdominal aortogram was obtained.  The catheter was  then pushed out of the aortic bifurcation.  Bilateral runoff was  obtained.   FINDINGS:  Aortogram:  The visualized portions of the suprarenal  abdominal aorta showed minimal disease.  There is mild bilateral renal  artery stenoses.  Prominent arc of Riolan is present.  There are no  hemodynamically  significant lesions within the infrarenal abdominal  aorta.  Bilateral common iliac arteries are widely patent.  The right  external iliac artery is patent throughout its course.  The right  hypogastric artery is patent.  Left common iliac artery is widely  patent.  Left hypogastric artery is patent.  The left external iliac  artery is occluded.   Right lower extremity:  Multiple high-grade stenoses and approximately  95% are present within the right common femoral artery.  The right  superficial femoral artery is occluded.  The right profunda femoral  artery is patent.  There is reconstitution of the popliteal artery above  the knee.  All 3 runoff vessels are visualized; however, visualization  is limited due to proximal disease.   Left lower extremity:  The left common femoral artery is not visualized.  There are multiple profunda branches visualized coming off of the  hypogastric artery in the left groin.  The superficial femoral artery is  occluded.  There is reconstitution of the popliteal artery above the  knee.  Popliteal artery is patent throughout its course.  Three  runoff  vessels are identified; however, not well opacified due to proximal  disease.   After the above images were obtained, decision was made to terminate the  procedure.  Catheters and wires were removed.  The patient was taken to  the holding area for sheath pull.   IMPRESSION:  1. Occluded left external iliac artery bypass.  2. High-grade stenosis within the right common femoral artery.  Left      common femoral artery occlusion.  3. Bilateral reconstitution of the above-knee popliteal artery.  4. Limited visualization of the runoff vessels, however, they all      appeared to be patent.      Jorge Ny, MD  Electronically Signed     VWB/MEDQ  D:  02/11/2009  T:  02/12/2009  Job:  [id number]

## 2011-03-14 NOTE — Discharge Summary (Signed)
[name], [name] [name]                  ACCOUNT NO.:  [id number]   MEDICAL RECORD NO.:  [id number]          PATIENT TYPE:  INP   LOCATION:  2011                         FACILITY:  [facility]   PHYSICIAN:  Larina Earthly, M.D.    DATE OF BIRTH:  06/13/1946   DATE OF ADMISSION:  09/28/2008  DATE OF DISCHARGE:  09/30/2008                               DISCHARGE SUMMARY   FINAL DISCHARGE DIAGNOSES:  1. Left lower extremity ischemia resulting in progressive      claudication.  2. Hyperlipidemia.  3. Gastroesophageal reflux disease.  4. History of peripheral vascular disease, status post bilateral      femoral endarterectomies.   PROCEDURE PERFORMED:  Left common femoral artery resection followed by  left external iliac artery to left profunda femoral artery bypass with 6-  mm thin-walled Gore-Tex graft by Dr. Arbie Cookey on September 28, 2008.   COMPLICATIONS:  None.   DISCHARGE MEDICATIONS:  1. Synthroid 100 mcg p.o. daily.  2. Aspirin 81 mg p.o. daily.  3. She is given Darvocet-N 100 one p.o. q.4 h. p.r.n. pain, total #20      were given.   CONDITION ON DISCHARGE:  Stable.   DISPOSITION:  She is being discharged home in stable condition with her  wounds healing well.  She is given careful instructions regarding the  care of her wounds and her activity level.  She was given an appointment  to see Dr. Arbie Cookey in 2 weeks with ABIs and for followup.  The office will  arrange a visit.   BRIEF IDENTIFYING STATEMENT:  For complete details, please refer to the  typed history and physical.  Briefly, this very pleasant 65 year old  woman was evaluated by Dr. Arbie Cookey for progressive left lower extremity  claudication.  She had previously undergone bilateral common femoral  endarterectomies.  Dr. Arbie Cookey felt that she should undergo a femoral to  above-knee popliteal bypass grafting.  She was informed of the risks and  benefits of the procedure, and after careful consideration, she elected  to proceed with  surgery.   HOSPITAL COURSE:  Preoperative workup was completed as an outpatient.  She was brought in through Same-Day Surgery and underwent the  aforementioned procedure.  Of note, when she was evaluated  intraoperatively, she was found to have an occluded common femoral  artery, and the procedure was changed from a femoral to above-knee  bypass grafting procedure to the aforementioned procedure.  For complete  details, please refer the typed operative report.  The procedure was  without complications.  She was returned to the Post Anesthesia Care  Unit  extubated.  Following stabilization, she was transferred to a bed on a  surgical convalescent floor.  Her diet and activity level were increased  as tolerated.  On September 30, 2008, she was desirous of discharge.  She  was found to be in stable condition and subsequently discharged.      Wilmon Arms, PA      Larina Earthly, M.D.  Electronically Signed    KEL/MEDQ  D:  09/30/2008  T:  09/30/2008  Job:  [id number]   cc:   Larina Earthly, M.D.

## 2011-03-14 NOTE — Discharge Summary (Signed)
[name] [name], [name] [name]                  ACCOUNT NO.:  [id number]   MEDICAL RECORD NO.:  [id number]          PATIENT TYPE:  INP   LOCATION:  2041                         FACILITY:  [facility]   PHYSICIAN:  Larina Earthly, M.D.    DATE OF BIRTH:  [birth year]   DATE OF ADMISSION:  03/02/2009  DATE OF DISCHARGE:  03/08/2009                               DISCHARGE SUMMARY   FINAL DISCHARGE DIAGNOSES:  1. Lower extremity arterial insufficiency.  2. Dyslipidemia.  3. Hypertension.   PROCEDURES PERFORMED:  Aortobifemoral bypass with 12.5 x 7 Hemashield  Dacron graft and bilateral femoral to above-knee popliteal artery  bypasses using 6-mm ring Propaten Gore-Tex graft by Dr. Arbie Cookey on Mar 02, 2009.   COMPLICATIONS:  None.   CONDITION AT DISCHARGE:  Stable and improving.   DISCHARGE MEDICATIONS:  She is instructed to resume all previous  medications consisting of:  1. Synthroid 100 mcg p.o. daily.  2. She is given a prescription for Percocet 5/325 one p.o. q.4 h.      p.r.n. pain, total number of 40 were given.   DISPOSITION:  She is being discharged home in stable condition with her  wounds healing well.  She is given careful instructions regarding the  care of her wounds and her activity level.  She is given a return  appointment with Dr. Arbie Cookey in 2 weeks with ABIs, the office will  arrange.   BRIEF IDENTIFYING STATEMENT:  For complete details, please refer the  typed history and physical.  Briefly, this very pleasant 65 year old  woman has bilateral extremity arterial insufficiency.  Dr. Arbie Cookey  evaluated her and found her to have poor inflow into both iliac arteries  and recommended aortobifemoral bypass grafting with the possible femoral  to above-knee popliteal bypass grafting.  She was informed of the risks  and benefits of the procedure, and after careful consideration, elected  to proceed with surgery.   HOSPITAL COURSE:  Preoperative workup was completed as an outpatient.  She was  brought in through same-day surgery and underwent the  aforementioned revascularization.  For complete details, please refer  the typed operative report.  The procedure was without complications.  She was returned to a bed in a surgical intensive care unit in critical,  but hemodynamically stable condition.  Mechanical ventilation was  subsequently weaned and she was extubated.  On the first postoperative  day, she was able to be transferred to a bed on a surgical convalescent  floor.   Following her transfer, her diet and activity level was advanced as  tolerated.  By Mar 08, 2009, she was walking and improving with physical  therapy.  Her wounds are healing well.  She was tolerating a regular  diet.  She was desirous of discharge and was subsequently discharged  home.      Wilmon Arms, PA      Larina Earthly, M.D.  Electronically Signed    KEL/MEDQ  D:  03/08/2009  T:  03/08/2009  Job:  [id number]   cc:   Larina Earthly, M.D.

## 2011-03-14 NOTE — Op Note (Signed)
NAMEVERSA, CRATON                  ACCOUNT NO.:  [id number]   MEDICAL RECORD NO.:  [id number]          PATIENT TYPE:  INP   LOCATION:  3301                         FACILITY:  [facility]   PHYSICIAN:  Larina Earthly, M.D.    DATE OF BIRTH:  1946-09-08   DATE OF PROCEDURE:  DATE OF DISCHARGE:                               OPERATIVE REPORT   PREOPERATIVE DIAGNOSIS:  Bilateral lower extremity claudication, left  greater than right.   POSTOPERATIVE DIAGNOSIS:  Bilateral lower extremity claudication, left  greater than right.   PROCEDURE:  Bilateral femoral endarterectomies and Dacron patch  angioplasty.   SURGEON:  Larina Earthly, M.D.   ASSISTANT:  Nurse.   ANESTHESIA:  General endotracheal.   COMPLICATIONS:  None.   DISPOSITION:  To recovery room, stable.Marland Kitchen   PROCEDURE IN DETAIL:  The patient was taken to the operating room,  placed in supine position.  The area of the both groins prepped and  draped in the usual sterile fashion.  Incision was made over the  inguinal crease, over the right common femoral artery, and carried down  to isolate the common superficial femoral and profunda femoris arteries.  The superficial femoral artery was atretic and chronically occluded.  The patient had 2 large profunda branches.  These were controlled with  vessel loops.  Dissection was carried up under the inguinal ligament.  The patient did have an extensively calcified external iliac artery and  this was as far as could be reached through the groin.  Next, separate  incision was made over the left femoral artery and again carried down to  isolate the common superficial femoral and profunda femoris arteries.  The patient had no pulse in the external iliac artery, therefore the  exposure was continued further proximally up in the retroperitoneal  space through the groin incision.  The area of occlusion at the external  iliac artery was isolated and the artery was encircled above this.  Tributary  branches were controlled with 2-0 silk Potts ties.  Again, the  superficial femoral artery was chronically occluded on the left.  The  profunda femoris artery was encircled with the vessel loop for control.  The patient was given 7000 intravenous heparin.  After circulation time,  the left external iliac artery was occluded above the occlusion and the  profunda femoris artery was occluded with a vessel loop.  The artery was  opened on the common femoral artery and extended longitudinally through  the occlusion up onto the external iliac artery above the occlusion.  This area was endarterectomized of the plaque that was causing the  occlusion.  The common femoral artery itself was not endarterectomized.  There was a posterior plaque present, but this was adherent.  A long  Hemashield Dacron patch was brought on to field and sewn as a patch  angioplasty with a running 6-0 Prolene suture.  This began on the  external iliac artery and extended down onto the profunda femoris  artery.  Prior to completion of the closure, the usual flushing  maneuvers were undertaken.  Several additional sutures  were required for  hemostasis.  Flow was then restored to the left leg.  Next, attention  was turned to the right leg.  Again, the common femoral artery, external  iliac artery, was occluded up under the inguinal ligament and the  profunda femoris artery and other branches were controlled with vessel  loops.  The common femoral artery was opened with 11-blade.  This was  extended down onto the profunda and up onto to the common femoral artery  and external iliac junction.  An extensive endarterectomy was undertaken  to the left groin.  This was carried up under the inguinal ligament.  There was continued plaques as far as could be reached, but there did  not appear to be flow-limiting stenosis at this level.  There were good  distal endpoints in both profunda branches.  A Finesse Hemashield Dacron  patch  was sewn as a patch angioplasty with a running 6-0 Prolene suture.  Prior to completion of the anastomosis, the usual flushing maneuvers  were undertaken.  The anastomosis was completed.  Good flow was restored  through the profunda femoris arteries.  The patient was given 50 mg of  protamine to reverse the heparin.  The wounds were irrigated with  saline.  Hemostasis with electrocautery.  Wounds were closed with  several layers of 2-0 Vicryl in the subcutaneous tissue.  Skin was  closed with 3-0 subcuticular Vicryl stitch.  Sterile dressings applied  and the patient was taken to the recovery room in stable condition.      Larina Earthly, M.D.  Electronically Signed     TFE/MEDQ  D:  04/07/2008  T:  04/08/2008  Job:  [id number]   cc:   Quita Skye. Artis Flock, M.D.

## 2011-03-14 NOTE — Op Note (Signed)
NAME:  [name] [name], [name] [name]                  ACCOUNT NO.:  [id number]   MEDICAL RECORD NO.:  [id number]          PATIENT TYPE:  AMB   LOCATION:  SDS                          FACILITY:  [facility]   PHYSICIAN:  VDurene Cal IV, MDDATE OF BIRTH:  Sep 30, 1946   DATE OF PROCEDURE:  03/19/2008  DATE OF DISCHARGE:                               OPERATIVE REPORT   PREOPERATIVE DIAGNOSIS:  1. Bilateral claudication, left greater than right.   PROCEDURE PERFORMED:  1. Ultrasound-guided access right common femoral artery.  2. Abdominal aortogram.  3. Bilateral lower extremity runoff.   PROCEDURE:  The patient was identified in the holding area and taken to  room A.  She was placed supine on the table.  Bilateral groins were  prepped and draped in standard sterile fashion.  Time-out was called.  Right common femoral artery is evaluated by ultrasound and found to be  diseased but patent.  1% Lidocaine was used for local anesthesia.  On  ultrasound guidance, the right common femoral artery was accessed with  an 18-gauge needle.  A 0.035 Bentson wire was advanced in the retrograde  fashion into the abdominal aorta under fluoroscopic visualization.  Next, a 5-French sheath was placed.  Over the wire, Omni flush catheter  was placed at the level L1 and abdominal aortogram was obtained.  Next,  catheter was pulled down the aorta bifurcation, bilateral lower  extremity runoff, and pelvic angiograms were obtained.   FINDINGS:  Aortogram:  The visualized portions of suprarenal abdominal  aorta showed minimal disease.  There are single renal arteries  bilaterally.  The infrarenal abdominal aorta shows mild disease.  It is  small in caliber.  The common iliac arteries are widely patent.  Bilateral hypogastric artery is widely patent.  The right external iliac  artery is patent.  The left external iliac artery is occluded at its  distal extent.   Right lower extremity:  The right common femoral artery has an area  of  high-grade stenosis versus occlusion.  Pelvic collaterals have  reconstituted the superficial femoral artery.  Profunda femoral artery  is patent.  Popliteal artery is patent with minimal disease.  The  patient has three-vessel runoff.   Left lower extremity:  The left common femoral artery is occluded.  There is reconstitution of the superficial femoral and profunda femoral  arteries from iliac collaterals.  There is reconstitution of the  popliteal artery at its most proximal extent.  Popliteal artery appears  disease-free.  There is three-vessel runoff.   After the above images were obtained, decision made to stop the  procedure.  Catheters and wires were removed.  The patient's sheath was  removed.  Pressure was held.   IMPRESSION:  1. Bilateral common femoral artery disease, left greater than right      with 100% occlusion of the left common femoral artery.  2. Bilateral superficial femoral disease.           ______________________________  V. Charlena Cross, MD  Electronically Signed     VWB/MEDQ  D:  03/19/2008  T:  03/20/2008  Job:  [id number]

## 2011-03-14 NOTE — Procedures (Signed)
BYPASS GRAFT EVALUATION   INDICATION:  Follow up aorta bifem and bilateral femoral-popliteal  bypass graft.   HISTORY:  Diabetes:  No.  Cardiac:  No.  Hypertension:  No.  Smoking:  Previous.  Previous Surgery:  Aortobifemoral and bilateral femoral-popliteal bypass  graft, 03/02/09.   SINGLE LEVEL ARTERIAL EXAM                               RIGHT              LEFT  Brachial:                    163                156  Anterior tibial:             172                168  Posterior tibial:            157                175  Peroneal:  Ankle/brachial index:        1.06               1.07   PREVIOUS ABI:  Date: 10/08/09  RIGHT:  1.06  LEFT:  1.15   LOWER EXTREMITY BYPASS GRAFT DUPLEX EXAM:   DUPLEX:  Patent aortobifemoral and bilateral femoral popliteal bypass  grafts appear patent with biphasic waveforms.  There are elevated  velocities at the right distal anastomosis of 241 cm/s.   IMPRESSION:  1. Stable ankle brachial indices bilaterally.  2. Patent aortobifemoral and bilateral femoral-popliteal bypass grafts      with elevated velocities, as described above.         ___________________________________________  Larina Earthly, M.D.   CB/MEDQ  D:  01/14/2010  T:  01/14/2010  Job:  [id number]

## 2011-03-14 NOTE — H&P (Signed)
HISTORY AND PHYSICAL EXAMINATION   August 21, 2008   Re:  Nery, Yuliya T                  DOB:  1945-11-24   CHIEF COMPLAINT:  Left leg claudication. The patient is a 65-year white  female who is having increasingly severe left leg claudication greater  than right.  She is status post bilateral common femoral artery  endarterectomies and Dacron patch angioplasties on April 07, 2008.  Her  preoperative arteriogram at that time had shown bilateral superficial  artery occlusions, but also common femoral artery occlusions.  It was  recommended that she undergo correction of the common femoral artery  occlusion, with this patient this would relieve her claudication.  I did  explain at that time that with superficial femoral artery disease that  she would in all likelihood have tolerable claudication.  She initially  had good relief but has now had recurrent symptoms..  She has no new  medical difficulty.  She specifically does not have any severe coronary  disease.  She does have history of hypothyroidism.  She is not a  diabetic.  She is married, she does not smoke, having quit 7 years ago,  has a glass of wine per day.   REVIEW OF SYSTEMS:  Positive for esophageal reflux.   PHYSICAL EXAM:  A well-developed well-nourished white female appearing  stated age 65.  Carotid arteries without bruits bilaterally.  She has 2+  radial and 2+ femoral pulses, absent popliteal and distal pulses.  Heart:  Regular rate and rhythm.  Chest:  Clear.  Her ankle-arm indices  have dropped bilaterally since her last visit, with her ankle-arm  index  in the right of 0.52, on the left 0.45.  I discussed this at length with  The patient.  I explained that she currently does not have any evidence  of limb-threatening ischemia.  She reports that she is unable to  tolerate the current level of claudication, most particularly on her  left leg.  I explained that her next surgical option would be  left  femoral-to-popliteal bypass.  Arteriogram revealed patent popliteal  arteries bilaterally with normal three vessel runoff bilaterally.  I did  explain the potential limitation of femoral-to-popliteal bypass with the  risk of occlusion with 5-year patency anticipated approximately 70%.  I  explained the procedure of a left saphenous vein harvest for femoral-to-  popliteal bypass.  I did explain the potential for worsening ischemia  should she have occlusion of her fem-pop bypass.  She understands this  and wishes to proceed with surgery..  This has been scheduled at her  convenience on 09/28/2008 at [facility].   Larina Earthly, M.D.  Electronically Signed   TFE/MEDQ  D:  08/21/2008  T:  08/24/2008  Job:  [id number]   cc:   Quita Skye. Artis Flock, M.D.

## 2011-03-14 NOTE — Assessment & Plan Note (Signed)
OFFICE VISIT   [name] [name], [name] [name]  DOB:  [birth year]                                       04/16/2009  UEAVW#:[id number]   The patient presents today for continued followup of her aortobifemoral  and bilateral fem-pop bypass on 03/02/2009.  She is doing quite well.  She reports that she is walking with no claudication symptoms.  She does  continue to have some moderately reduced stamina.  Otherwise is doing  back to her baseline.   Her incisions are all healing quite nicely.  She does have slight amount  of exposed Vicryl suture in the right groin and this was removed.  She  has 2-3+ dorsalis pedis pulses bilaterally.  She will continue her  walking program and I plan to see her again in 3 months with noninvasive  vascular laboratory protocol followup.   Larina Earthly, M.D.  Electronically Signed   TFE/MEDQ  D:  04/16/2009  T:  04/16/2009  Job:  [id number]   cc:   Quita Skye. Artis Flock, M.D.

## 2011-03-14 NOTE — Assessment & Plan Note (Signed)
OFFICE VISIT   KHIA, DIETERICH  DOB:  01-18-1946                                       10/09/2008  IHKVQ#:[id number]   The patient presents today for follow-up of her recent left external  iliac-to-profunda bypass with a Gore-Tex graft on September 28, 2008.  She is healing nicely in her groin, does have the usual soreness.  She  has not been able to walk to really assess the level of claudication.  I  had a long discussion again with the patient.  She has a very difficult  situation.  She has hypoplastic iliac arteries.  I had attempted  initially to correct her claudication with bilateral femoral  endarterectomies and patches.  She on exploration on September 28, 2008,  had occlusion of her patch and I was able exposure her external iliac  upon her inguinal ligament with an interposition with Gore-Tex graft.  At that time, I elected not to proceed with a left fem-pop with vein due  to concern regarding adequate inflow with her small iliac arteries.  I  discussed this again at length with the patient today, explained that I  feel that her next step may be aortofemoral bypass.  At that time we  would probably proceed with left fem-pop bypass with vein.  She  understands this and we will see her again in 1 month for a recheck to  determine if she is having adequate relief of her claudication.   Larina Earthly, M.D.  Electronically Signed   TFE/MEDQ  D:  10/09/2008  T:  10/12/2008  Job:  [id number]   cc:   Quita Skye. Artis Flock, M.D.

## 2011-03-14 NOTE — Consult Note (Signed)
NEW PATIENT CONSULTATION   [name], [name] T  DOB:  [birth year]                                       03/13/2008  EAVWU#:[id number]   The patient presents today for evaluation for bilateral lower extremity  claudication.  She is an otherwise healthy 65 year old white female who  reports pain in her calves and pretibial area with walking.  She denies  any rest pain.  She reports that this has become more progressive and is  limiting her ability to exercise and do her activities of daily living.  She reports that if she really pushes the walking that this can extend  up into her thighs as well.  This is somewhat worse on the left than the  right.   PAST HISTORY:  Negative for any major medical difficulties.  She does  have a history of hypothyroidism.   FAMILY HISTORY:  Negative for premature atherosclerotic disease.   SOCIAL HISTORY:  She is widowed.  She works as a Veterinary surgeon.  She  does not smoke, having quit 7 years ago.  She drinks one glass of wine  per day.   REVIEW OF SYSTEMS:  She reports her weight as 130 pounds, she is 5 feet  1 inch tall.  CARDIAC:  Negative.  PULMONARY:  Negative.  GI:  Positive for reflux.  GU:  Negative.  VASCULAR:  She does have pain with walking.  NEUROLOGIC:  Negative.  ORTHOPEDIC:  Negative.  PSYCHIATRIC:  Negative.   ALLERGIES:  No known drug allergies.   MEDICATIONS:  1. Levothyroxine 100 mcg daily.  2. Cilostazol 100 mg daily.   PHYSICAL EXAM:  A well-developed, well-nourished white female appearing  stated age 65.  Blood pressure is 135/75, pulse 79, respirations 16.  She is grossly intact neurologically.  Her carotid arteries are without  bruits bilaterally.  She has 2+ radial pulses bilaterally.  She has 1+  femoral pulses bilaterally.  She does have a palpable 1+ right popliteal  and right dorsalis pedis, I do not feel a left popliteal or left  dorsalis pedis pulse.  She has no tissue loss in her lower  extremities.  Heart:  Regular rate and rhythm.  Chest:  Clear bilaterally.  Abdominal:  No masses, no tenderness and no bruits.   She did undergo noninvasive vascular laboratory studies at outlying  diagnostic center on 02/17/2008 and I have these for review.  Ankle arm  index is 0.74 on the right and 0.58 on the left.  I discussed this at  length with Ms. Strohmeier.  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  I have recommended  that we proceed with arteriography and potential angioplasty stent  treatment of this.  We have scheduled this at [facility] as an  outpatient on 03/19/2008 with Dr. Durene Cal.  We will make further  recommendations pending the arteriography and she understands we will  proceed with treatment if she has minimal lesion for treatment at the  time of her arteriogram.   Larina Earthly, M.D.  Electronically Signed   TFE/MEDQ  D:  03/13/2008  T:  03/16/2008  Job:  [id number]   cc:   Quita Skye. Artis Flock, M.D.

## 2011-03-17 NOTE — Op Note (Signed)
[name], [name] [name]                              ACCOUNT NO.:  [id number]   MEDICAL RECORD NO.:  [id number]                   PATIENT TYPE:  AMB   LOCATION:  DSC                                  FACILITY:  [facility]   PHYSICIAN:  Currie Paris, M.D.           DATE OF BIRTH:  [birth year]   DATE OF PROCEDURE:  07/29/2003  DATE OF DISCHARGE:                                 OPERATIVE REPORT   OFFICE MEDICAL RECORD NUMBER:  [id number]   PREOPERATIVE DIAGNOSIS:  Nipple discharge, left breast, possible intraductal  papilloma.   POSTOPERATIVE DIAGNOSIS:  Nipple discharge, left breast, possible  intraductal papilloma.   OPERATION:  Left breast ductal excision.   SURGEON:  Currie Paris, M.D.   ANESTHESIA:  General (LMA).   CLINICAL HISTORY:  This patient has a clear discharge from the left breast,  but on ultrasound, markedly dilated distal subareolar ducts with  questionable filling defect in one.   DESCRIPTION OF PROCEDURE:  The patient was seen in the holding area and had  no further questions.  She was taken to the operating room and after  satisfactory general anesthesia was obtained, the breast was prepped and  draped.  I initially attempted to cannulate the duct, which was at the 4  o'clock position, but could only get a tear duct probe to go a short  distance.  I then made a curvilinear incision at the areolar margin from  about the 3 o'clock position to the 6 o'clock position and then elevated the  skin of the areola towards the nipple proper.  In doing so, I found a large  dilated duct that had some very thick mucinous-type material draining in it.  I got around this and then divided it, detaching it from the undersurface of  the nipple itself.  There were some other dilated ducts nearby which I also  divided.  Once that was done, and using some traction, I then took a central  ductal excision of the tissue directly under the nipple.  I did see one  small nodule which  I thought corresponded with what had been seen on  ultrasound.  Once this was done, I controlled the bleeding with the cautery,  infiltrated some 0.25% plain Marcaine and closed in layers with 3-0 Vicryl,  4-0 Monocryl and Dermabond.  The patient tolerated the procedure well.  There were no operative complications and all counts were correct.                                               Currie Paris, M.D.    CJS/MEDQ  D:  07/29/2003  T:  07/29/2003  Job:  [id number]   cc:   Vale Haven. Andrey Campanile, M.D.  [address]  [place]  [place] [address]  Fax: [phone]   S. Kyra Manges, M.D.  [phone] [address]  [place]  [place] [address]  Fax: [phone]   Gregary Signs A. Everardo All, M.D. [facility]

## 2011-03-23 ENCOUNTER — Ambulatory Visit: Payer: Self-pay

## 2011-03-24 ENCOUNTER — Ambulatory Visit (INDEPENDENT_AMBULATORY_CARE_PROVIDER_SITE_OTHER): Payer: BC Managed Care – PPO

## 2011-03-24 ENCOUNTER — Encounter (INDEPENDENT_AMBULATORY_CARE_PROVIDER_SITE_OTHER): Payer: BC Managed Care – PPO

## 2011-03-24 DIAGNOSIS — Z48812 Encounter for surgical aftercare following surgery on the circulatory system: Secondary | ICD-10-CM

## 2011-03-24 DIAGNOSIS — I739 Peripheral vascular disease, unspecified: Secondary | ICD-10-CM

## 2011-03-24 NOTE — Assessment & Plan Note (Signed)
OFFICE VISIT  Kulpa, [name] [name] DOB:  1946/07/14                                       03/24/2011 IONGE#:[id number]  A patient of Dr. Bosie Helper.  Dr. Imogene Burn is the attending physician in the office today.  HISTORY OF PRESENT ILLNESS:  The patient is a 65 year old woman who had an aortobifemoral and bilateral femoral to above knee popliteal bypass with Propaten graft in May of 2010.  She returns today for her routine followup and ABIs.  The patient states that she is doing well and has no issues, is walking and doing all her activities without any pain in her legs.  She does have a small incisional hernia which she says is not painful and is not giving her any problems.  She has had no changes in her medical history from a year ago or her medications.  Duplex bypass graft scan shows pressures of 133 at the proximal anastomosis and 179 at the distal anastomosis of her Propaten graft on the right and on the left the proximal anastomosis 119 and the distal anastomosis of is 144.  Her ABIs on the right are 1.05 and on the left 1.08.  PHYSICAL EXAM:  This is a well-developed, well-nourished woman in no acute distress.  Her gait is normal.  Heart rate was 70, sats were 100% and respiratory rate was 12.  Her abdomen was soft and nontender.  She had a keloid type scar in the midline.  She had an area just above the umbilicus which was a small incisional hernia which was nontender.  She had normoactive bowel sounds.  Bilateral lower extremities, all her wounds were well-healed.  She had palpable DP and PT pulses bilaterally and both feet were warm and pink.  ASSESSMENT:  Normal ABIs status post aortobifemoral bilateral femoral- popliteal bypasses of 2 years' duration with good flow to both feet and palpable pulses.  She will follow up in 6 months with duplex scan and ABIs.  Della Goo, PA-C  Fransisco Hertz, MD Electronically Signed  RR/MEDQ  D:  03/24/2011  [name]:   03/24/2011  Job:  [id number]

## 2011-04-05 NOTE — Procedures (Unsigned)
BYPASS GRAFT EVALUATION  INDICATION:  Followup bypass graft placement.  HISTORY: Diabetes:  No. Cardiac:  No. Hypertension:  No. Smoking:  Previously. Previous Surgery:  Aortobifemoral, bilateral fem-pop 03/02/2009.  SINGLE LEVEL ARTERIAL EXAM                              RIGHT              LEFT Brachial:                    155                152 Anterior tibial:             163                167 Posterior tibial:            152                165 Peroneal: Ankle/brachial index:        1.05               1.08  PREVIOUS ABI:  Date:  01/14/2010  RIGHT:  1.06  LEFT:  1.07  LOWER EXTREMITY BYPASS GRAFT DUPLEX EXAM:  DUPLEX:  Patent bilateral femoral to popliteal bypass graft with velocity measurements noted on the following page.  IMPRESSION: 1. Bilateral ankle brachial indices are within normal limits and     stable from the previous study. 2. Patent bilateral femoral to popliteal bypass grafts with velocities     on the following worksheet.  ___________________________________________ Larina Earthly, M.D.  EM/MEDQ  D:  03/28/2011  T:  03/28/2011  Job:  [id number]

## 2011-07-26 LAB — PROTIME-INR
INR: 0.9
Prothrombin Time: 12.4

## 2011-07-26 LAB — CBC
Hemoglobin: 14.9
RBC: 5.08
WBC: 8.6

## 2011-07-26 LAB — BASIC METABOLIC PANEL
CO2: 27
Calcium: 9.2
Creatinine, Ser: 0.77
GFR calc Af Amer: >60
Sodium: 140

## 2011-07-26 LAB — APTT: aPTT: 24

## 2011-07-27 LAB — POCT I-STAT 4, (NA,K, GLUC, HGB,HCT)
Operator id: 114421
Potassium: 4.5

## 2011-07-27 LAB — COMPREHENSIVE METABOLIC PANEL
ALT: 22
AST: 21
Alkaline Phosphatase: 99
CO2: 26
Calcium: 9.2
Chloride: 104
GFR calc Af Amer: >60
GFR calc non Af Amer: 59 — ABNORMAL LOW
Glucose, Bld: 92
Potassium: 4.5
Sodium: 138

## 2011-07-27 LAB — URINALYSIS, ROUTINE W REFLEX MICROSCOPIC
Glucose, UA: NEGATIVE
Hgb urine dipstick: NEGATIVE
Ketones, ur: NEGATIVE
pH: 6.5

## 2011-07-27 LAB — CBC
Hemoglobin: 10.9 — ABNORMAL LOW
Hemoglobin: 14.7
MCHC: 35.5
MCV: 86.7
RBC: 3.55 — ABNORMAL LOW
RBC: 4.9
WBC: 8.1

## 2011-07-27 LAB — BASIC METABOLIC PANEL
CO2: 25
Chloride: 102
GFR calc Af Amer: >60
Sodium: 134 — ABNORMAL LOW

## 2011-07-27 LAB — TYPE AND SCREEN: ABO/RH(D): A POS

## 2011-07-27 LAB — ABO/RH: ABO/RH(D): A POS

## 2011-08-01 LAB — URINALYSIS, ROUTINE W REFLEX MICROSCOPIC
Bilirubin Urine: NEGATIVE
Ketones, ur: NEGATIVE mg/dL
Nitrite: NEGATIVE
pH: 5.5 (ref 5.0–8.0)

## 2011-08-01 LAB — COMPREHENSIVE METABOLIC PANEL
AST: 28 U/L (ref 0–37)
Albumin: 3.9 g/dL (ref 3.5–5.2)
Calcium: 8.9 mg/dL (ref 8.4–10.5)
Creatinine, Ser: 0.77 mg/dL (ref 0.4–1.2)
GFR calc Af Amer: >60 mL/min (ref >60)
GFR calc non Af Amer: >60 mL/min (ref >60)
Total Protein: 6.5 g/dL (ref 6.0–8.3)

## 2011-08-01 LAB — TYPE AND SCREEN

## 2011-08-01 LAB — CBC
MCHC: 34.7 g/dL (ref 30.0–36.0)
MCV: 86.7 fL (ref 78.0–100.0)
Platelets: 250 10*3/uL (ref 150–400)
RDW: 13.1 % (ref 11.5–15.5)

## 2011-08-01 LAB — APTT: aPTT: 24 seconds (ref 24–37)

## 2011-08-04 LAB — CBC
HCT: 33.5 % — ABNORMAL LOW (ref 36.0–46.0)
Hemoglobin: 11.4 g/dL — ABNORMAL LOW (ref 12.0–15.0)
MCV: 87.7 fL (ref 78.0–100.0)
Platelets: 173 10*3/uL (ref 150–400)
RBC: 3.83 MIL/uL — ABNORMAL LOW (ref 3.87–5.11)
WBC: 8.4 10*3/uL (ref 4.0–10.5)

## 2011-08-04 LAB — BASIC METABOLIC PANEL
BUN: 4 mg/dL — ABNORMAL LOW (ref 6–23)
Chloride: 104 mEq/L (ref 96–112)
Potassium: 3.8 mEq/L (ref 3.5–5.1)

## 2011-09-07 ENCOUNTER — Other Ambulatory Visit: Payer: Self-pay | Admitting: *Deleted

## 2011-09-07 ENCOUNTER — Other Ambulatory Visit (INDEPENDENT_AMBULATORY_CARE_PROVIDER_SITE_OTHER): Payer: Medicare Other | Admitting: *Deleted

## 2011-09-07 ENCOUNTER — Ambulatory Visit (INDEPENDENT_AMBULATORY_CARE_PROVIDER_SITE_OTHER): Payer: Medicare Other | Admitting: *Deleted

## 2011-09-07 ENCOUNTER — Encounter: Payer: Self-pay | Admitting: Vascular Surgery

## 2011-09-07 ENCOUNTER — Ambulatory Visit (INDEPENDENT_AMBULATORY_CARE_PROVIDER_SITE_OTHER): Payer: Medicare Other | Admitting: Vascular Surgery

## 2011-09-07 VITALS — BP 161/72 | HR 79 | Resp 20 | Ht 61.0 in | Wt 129.0 lb

## 2011-09-07 DIAGNOSIS — Z48812 Encounter for surgical aftercare following surgery on the circulatory system: Secondary | ICD-10-CM

## 2011-09-07 DIAGNOSIS — I70229 Atherosclerosis of native arteries of extremities with rest pain, unspecified extremity: Secondary | ICD-10-CM

## 2011-09-07 DIAGNOSIS — I739 Peripheral vascular disease, unspecified: Secondary | ICD-10-CM

## 2011-09-07 DIAGNOSIS — I70219 Atherosclerosis of native arteries of extremities with intermittent claudication, unspecified extremity: Secondary | ICD-10-CM

## 2011-09-07 DIAGNOSIS — T82898A Other specified complication of vascular prosthetic devices, implants and grafts, initial encounter: Secondary | ICD-10-CM

## 2011-09-07 MED ORDER — DEXTROSE 5 % IV SOLN: 1.5000 g | Freq: Once | INTRAVENOUS | Status: DC

## 2011-09-07 MED ORDER — SODIUM CHLORIDE 0.9 % IV SOLN
INTRAVENOUS | Status: DC
  Administered 2011-09-08: 09:00:00 via INTRAVENOUS

## 2011-09-07 MED ORDER — SODIUM CHLORIDE 0.9 % IV SOLN: INTRAVENOUS | Status: DC

## 2011-09-07 NOTE — Progress Notes (Signed)
[facility] [place] HISTORY AND PHYSICAL   History of Present Illness:  [name] who presents for evaluation of claudication.  The patient currently describes a cramping sensation in the right lower extremity. There is not a component of rest pain.  There is no history of ulcerations on the feet.  The patient had been doing well until about 5-7 days ago when after taking a 2 mile walk she noticed that she can only walk about one to 2 blocks reports been experiencing claudication in the right leg.  Prior to this she could walk unlimited distances. She has a complicated previous vascular history with bilateral femoral endarterectomies by Dr. Arbie Cookey in June of 2009 followed by a left external iliac to left profunda bypass in November of 2009. She subsequently had an aortobifemoral bypass with bilateral femoral to above-knee popliteal bypasses in May of 2010. Atherosclerotic risk factors and other medical problems include hyperlipidemia, hypertension. She quit smoking in 2002.  Past Medical History  Diagnosis Date  . Peripheral arterial disease   . Hyperlipidemia   . Hypertension   . GERD (gastroesophageal reflux disease)   . Thyroid disease     Hypothyroidism    Past Surgical History  Procedure Date  . Femoral endarterectomy 04-07-08    bilateral common femoral arteries  . Pr vein bypass graft,aorto-fem-pop 03/02/09    Aortobifemoral BPG, Bilateral fem- popliteal BPG    ROS:   General:  No weight loss, Fever, chills  HEENT: No recent headaches, no nasal bleeding, no visual changes, no sore throat  Neurologic: No dizziness, blackouts, seizures. No recent symptoms of stroke or mini- stroke. No recent episodes of slurred speech, or temporary blindness.  Cardiac: No recent episodes of chest pain/pressure, no shortness of breath at rest.  No shortness of breath with exertion.  Denies history of atrial fibrillation or irregular  heartbeat  Vascular: No history of rest pain in feet.  No history of claudication.  No history of non-healing ulcer, No history of DVT   Pulmonary: No home oxygen, no productive cough, no hemoptysis,  No asthma or wheezing  Musculoskeletal:  [ ]  Arthritis, [ ]  Low back pain,  [ ]  Joint pain  Hematologic:No history of hypercoagulable state.  No history of easy bleeding.  No history of anemia  Gastrointestinal: No hematochezia or melena,  No gastroesophageal reflux, no trouble swallowing  Urinary: [ ]  chronic Kidney disease, [ ]  on HD - [ ]  MWF or [ ]  TTHS, [ ]  Burning with urination, [ ]  Frequent urination, [ ]  Difficulty urinating;   Skin: No rashes  Psychological: No history of anxiety,  No history of depression  Social History History  Substance Use Topics  . Smoking status: Former Smoker    Quit date: 09/06/2001  . Smokeless tobacco: Not on file  . Alcohol Use: No    Family History History reviewed. No pertinent family history.  Allergies  Allergies  Allergen Reactions  . Morphine And Related Nausea And Vomiting     Current Outpatient Prescriptions  Medication Sig Dispense Refill  . aspirin 81 MG tablet Take 81 mg by mouth daily.        Marland Kitchen levothyroxine (SYNTHROID, LEVOTHROID) 112 MCG tablet Take 112 mcg by mouth daily.        . simvastatin (ZOCOR) 40 MG tablet Take 40 mg by mouth at bedtime.          Physical Examination  Filed Vitals:  09/07/11 1423  BP: 161/72  Pulse: 79  Resp: 20  Height: 5\' 1"  (1.549 m)  Weight: 129 lb (58.514 kg)  SpO2: 98%    Body mass index is 24.37 kg/(m^2).  General:  Alert and oriented, no acute distress HEENT: Normal Neck: No bruit or JVD Pulmonary: Clear to auscultation bilaterally Cardiac: Regular Rate and Rhythm without murmur Abdomen: Soft, non-tender, non-distended, no mass, no scars Skin: No rash, right foot is pink and warm with brisk capillary refill Extremity Pulses:  2+ radial, brachial, femoral  pulses  bilaterally, space she has a 2+ left popliteal pulse and a 1+ left dorsalis pedis pulse, she has absent popliteal and pedal pulses in the right foot Musculoskeletal: No deformity or edema  Neurologic: Upper and lower extremity motor 5/5 and symmetric  DATA:  Patient had a graft duplex scan today which shows a right femoropopliteal graft is occluded. The left femoropopliteal was patent with no increased velocities. ABI on the right side is now 0.7 compared to 1.05 previously. Heavy on the left side is 1.27 compared to 1.08 previously  ASSESSMENT: Short distance claudication right lower extremity. The patient has a complicated vascular history with multiple prior procedures. I did discuss with her current symptoms and she states that she would rather have an operation then continued in her present state. She also realizes that durability obviously has been a problem with her. In light of her previous multiple operations I believe she needs an arteriogram for further evaluation of her right lower extremity. Subsequent to this I will place her on the OR schedule for Dr. Arbie Cookey to either do a thrombectomy and revision of her right femoropopliteal or consider a more distal bypass for Monday, 09/11/2011. Risks benefits possible complications and procedure details of her arteriogram including but not limited to bleeding infection contrast reaction vessel injury were explained the patient today. Also risk of infection of her prosthetic graft as well as thrombosis of her femoropopliteal bypass were explained. I also went over some of the risks benefits and possible complications of a redo in her right femoropopliteal bypass and informed her that Dr. Arbie Cookey would discuss this further with her as well.   PLAN:   Fabienne Bruns, MD [facility] [place] Office: [phone] Pager: [phone]

## 2011-09-08 ENCOUNTER — Ambulatory Visit (HOSPITAL_COMMUNITY)
Admission: RE | Admit: 2011-09-08 | Discharge: 2011-09-08 | Disposition: A | Discharge status: Home / Self Care | Payer: Medicare Other | Source: Ambulatory Visit | Attending: Vascular Surgery | Admitting: Vascular Surgery

## 2011-09-08 ENCOUNTER — Encounter (HOSPITAL_COMMUNITY): Payer: Self-pay

## 2011-09-08 ENCOUNTER — Ambulatory Visit (HOSPITAL_COMMUNITY): Payer: Medicare Other

## 2011-09-08 ENCOUNTER — Other Ambulatory Visit: Payer: Self-pay

## 2011-09-08 ENCOUNTER — Encounter (HOSPITAL_COMMUNITY)
Admission: RE | Disposition: A | Discharge status: Home / Self Care | Payer: Self-pay | Source: Ambulatory Visit | Attending: Vascular Surgery

## 2011-09-08 DIAGNOSIS — Z01810 Encounter for preprocedural cardiovascular examination: Secondary | ICD-10-CM | POA: Insufficient documentation

## 2011-09-08 DIAGNOSIS — Z01812 Encounter for preprocedural laboratory examination: Secondary | ICD-10-CM | POA: Insufficient documentation

## 2011-09-08 DIAGNOSIS — K219 Gastro-esophageal reflux disease without esophagitis: Secondary | ICD-10-CM | POA: Insufficient documentation

## 2011-09-08 DIAGNOSIS — Z01818 Encounter for other preprocedural examination: Secondary | ICD-10-CM | POA: Insufficient documentation

## 2011-09-08 DIAGNOSIS — Y832 Surgical operation with anastomosis, bypass or graft as the cause of abnormal reaction of the patient, or of later complication, without mention of misadventure at the time of the procedure: Secondary | ICD-10-CM | POA: Insufficient documentation

## 2011-09-08 DIAGNOSIS — E039 Hypothyroidism, unspecified: Secondary | ICD-10-CM | POA: Insufficient documentation

## 2011-09-08 DIAGNOSIS — K08409 Partial loss of teeth, unspecified cause, unspecified class: Secondary | ICD-10-CM | POA: Insufficient documentation

## 2011-09-08 DIAGNOSIS — T82898A Other specified complication of vascular prosthetic devices, implants and grafts, initial encounter: Secondary | ICD-10-CM | POA: Insufficient documentation

## 2011-09-08 DIAGNOSIS — I10 Essential (primary) hypertension: Secondary | ICD-10-CM | POA: Insufficient documentation

## 2011-09-08 DIAGNOSIS — I70509 Unspecified atherosclerosis of nonautologous biological bypass graft(s) of the extremities, unspecified extremity: Secondary | ICD-10-CM

## 2011-09-08 HISTORY — PX: ABDOMINAL AORTAGRAM: SHX5454

## 2011-09-08 HISTORY — PX: LOWER EXTREMITY ANGIOGRAM: SHX5508

## 2011-09-08 LAB — PROTIME-INR: INR: 0.9 (ref 0.00–1.49)

## 2011-09-08 LAB — BASIC METABOLIC PANEL
CO2: 26 mEq/L (ref 19–32)
CO2: 26 mEq/L (ref 19–32)
Calcium: 8.8 mg/dL (ref 8.4–10.5)
Chloride: 104 mEq/L (ref 96–112)
Creatinine, Ser: 0.77 mg/dL (ref 0.50–1.10)
GFR calc non Af Amer: 86 mL/min — ABNORMAL LOW (ref >90)
Glucose, Bld: 130 mg/dL — ABNORMAL HIGH (ref 70–99)
Potassium: 4.5 mEq/L (ref 3.5–5.1)
Sodium: 140 mEq/L (ref 135–145)

## 2011-09-08 LAB — CBC
HCT: 45.6 % (ref 36.0–46.0)
MCV: 89.4 fL (ref 78.0–100.0)
Platelets: 211 10*3/uL (ref 150–400)
RBC: 5.1 MIL/uL (ref 3.87–5.11)
WBC: 7.8 10*3/uL (ref 4.0–10.5)

## 2011-09-08 SURGERY — ABDOMINAL AORTAGRAM
Anesthesia: LOCAL

## 2011-09-08 SURGERY — Surgical Case
Anesthesia: *Unknown

## 2011-09-08 MED ORDER — ONDANSETRON HCL 4 MG/2ML IJ SOLN: 4.0000 mg | Freq: Four times a day (QID) | INTRAMUSCULAR | Status: DC | PRN

## 2011-09-08 MED ORDER — FENTANYL CITRATE 0.05 MG/ML IJ SOLN
INTRAMUSCULAR | Status: AC
  Filled 2011-09-08: qty 2

## 2011-09-08 MED ORDER — ACETAMINOPHEN 325 MG PO TABS: 325.0000 mg | ORAL_TABLET | ORAL | Status: DC | PRN

## 2011-09-08 MED ORDER — GUAIFENESIN-DM 100-10 MG/5ML PO SYRP: 15.0000 mL | ORAL_SOLUTION | ORAL | Status: DC | PRN

## 2011-09-08 MED ORDER — MORPHINE SULFATE 2 MG/ML IJ SOLN: 2.0000 mg | INTRAMUSCULAR | Status: DC | PRN

## 2011-09-08 MED ORDER — POLYETHYLENE GLYCOL 3350 17 G PO PACK: 17.0000 g | PACK | Freq: Every day | ORAL | Status: DC | PRN

## 2011-09-08 MED ORDER — BISACODYL 10 MG RE SUPP: 10.0000 mg | Freq: Every day | RECTAL | Status: DC | PRN

## 2011-09-08 MED ORDER — FAMOTIDINE IN NACL 20-0.9 MG/50ML-% IV SOLN: 20.0000 mg | Freq: Two times a day (BID) | INTRAVENOUS | Status: DC

## 2011-09-08 MED ORDER — MAGNESIUM HYDROXIDE 400 MG/5ML PO SUSP: 30.0000 mL | ORAL | Status: DC | PRN

## 2011-09-08 MED ORDER — HEPARIN (PORCINE) IN NACL 2-0.9 UNIT/ML-% IJ SOLN
INTRAMUSCULAR | Status: AC
  Filled 2011-09-08: qty 1000

## 2011-09-08 MED ORDER — POTASSIUM CHLORIDE CRYS ER 20 MEQ PO TBCR
20.0000 meq | EXTENDED_RELEASE_TABLET | Freq: Once | ORAL | Status: DC | PRN
Start: 2011-09-08 — End: 2011-09-08
  Filled 2011-09-08: qty 2

## 2011-09-08 MED ORDER — MIDAZOLAM HCL 2 MG/2ML IJ SOLN
INTRAMUSCULAR | Status: AC
  Filled 2011-09-08: qty 2

## 2011-09-08 MED ORDER — DEXTROSE 5 % IV SOLN: 1.5000 g | Freq: Two times a day (BID) | INTRAVENOUS | Status: DC

## 2011-09-08 MED ORDER — DOPAMINE-DEXTROSE 3.2-5 MG/ML-% IV SOLN: 3.0000 ug/kg/min | INTRAVENOUS | Status: DC

## 2011-09-08 MED ORDER — ACETAMINOPHEN 325 MG RE SUPP: 325.0000 mg | RECTAL | Status: DC | PRN

## 2011-09-08 MED ORDER — METOPROLOL TARTRATE 1 MG/ML IV SOLN: 2.0000 mg | INTRAVENOUS | Status: DC | PRN

## 2011-09-08 MED ORDER — ALUM & MAG HYDROXIDE-SIMETH 400-400-40 MG/5ML PO SUSP: 15.0000 mL | ORAL | Status: DC | PRN

## 2011-09-08 MED ORDER — HYDRALAZINE HCL 20 MG/ML IJ SOLN: 10.0000 mg | INTRAMUSCULAR | Status: DC | PRN

## 2011-09-08 MED ORDER — LABETALOL HCL 5 MG/ML IV SOLN: 10.0000 mg | INTRAVENOUS | Status: DC | PRN

## 2011-09-08 MED ORDER — PHENOL 1.4 % MT LIQD: 1.0000 | OROMUCOSAL | Status: DC | PRN

## 2011-09-08 MED ORDER — DOCUSATE SODIUM 100 MG PO CAPS: 100.0000 mg | ORAL_CAPSULE | Freq: Every day | ORAL | Status: DC

## 2011-09-08 MED ORDER — OXYCODONE HCL 5 MG PO TABS: 5.0000 mg | ORAL_TABLET | ORAL | Status: DC | PRN

## 2011-09-08 MED ORDER — MAGNESIUM SULFATE 50 % IJ SOLN
2.0000 g | Freq: Once | INTRAVENOUS | Status: DC | PRN
  Filled 2011-09-08: qty 4

## 2011-09-08 MED ORDER — DEXTROSE-NACL 5-0.45 % IV SOLN
INTRAVENOUS | Status: DC
  Administered 2011-09-08: 14:00:00 via INTRAVENOUS

## 2011-09-08 MED ORDER — SODIUM CHLORIDE 0.9 % IV SOLN: 500.0000 mL | Freq: Once | INTRAVENOUS | Status: DC | PRN

## 2011-09-08 NOTE — Progress Notes (Signed)
The patient has been re-examined.  The patient's history and physical has been reviewed and is unchanged.  There is no change in the plan of care.  Dorothee Napierkowski, MD 

## 2011-09-08 NOTE — Op Note (Signed)
Procedure: Aortogram with bilateral lower extremity runoff  Preoperative diagnosis: Thrombosed right femoropopliteal bypass with claudication  Postoperative diagnosis: Same  Anesthesia: Local with IV sedation  Indications: Patient is a 65 year old female who previously underwent aortobifemoral bypass and bilateral femoral to above-knee popliteal bypasses by Dr. Arbie Cookey in 2010. Of note she also previously has had bilateral femoral endarterectomies. She was noted on surveillance graft duplex scan today to have a thrombosed right femoropopliteal bypass. She has very short distance claudication. She presents today for diagnostic arteriogram for operatiive planning purposes.  Operative details: After obtaining informed consent, the patient was taken to the PV Lab. The patient was placed in supine position on the Angio table. Both groins were prepped and draped in the usual sterile fashion. Local anesthesia was infiltrated over the area of the left common femoral artery. An introducer needle was used to cannulate the left common femoral artery without difficulty. An 0.0035 Rosen wire was then threaded and the left common femoral artery and up into the abdominal aorta under fluoroscopic guidance. A 6 French dilator was then used to dilate the tract to the dense scar tissue. 6 [place] dilator was then removed and exchanged over the guidewire for a 5 French sheath and dilator. The dilator for the 5 French sheath was then removed and the 5 Fr sheath was thoroughly flushed with heparinized saline. Next a 5 French pigtail catheter was placed over the guidewire up into the abdominal aorta. The guidewire was removed and an abdominal aortogram was obtained through the pigtail catheter. It shows bilateral single renal arteries which are patent there is a infrarenal anastomosis from the aortobifemoral bypass graft which looks end-to-end. There is approximately 25% narrowing of this. The right and left limbs of the  aortobifemoral bypass graft are patent. The distal anastomoses are to the femoral arteries bilaterally. Next the pigtail catheter was used to perform a lateral aortogram which shows a patent superior mesenteric and celiac artery with no significant flow-limiting stenosis. The waist on the aortic graft at the anastomosis again is fairly trivial  Next bilateral lower extremity runoff views were performed after pulling the pigtail catheter down just above the bifurcation.  In the right lower extremity, the right common femoral artery is patent. The right profunda femoris artery is patent. The right femoropopliteal bypass and the native superficial femoral artery is occluded. There is reconstitution of the above-knee popliteal artery via profunda collaterals. The tibial vessels are small as well as the popliteal artery. These are all however patent and there is good flow all the way to the level of the right foot.  In the left lower extremity the left common femoral artery profunda femoris artery is patent. The native left superficial femoral artery is occluded. There is a patent left femoral to above-knee popliteal bypass which is slightly tortuous. The flow through this is slightly sluggish but there is no flow limiting stenosis seen. The popliteal artery is patent and there is three-vessel runoff to the left foot although the peroneal artery is quite diminutive.  At this point some additional views were obtained of the femoropopliteal on the left side through the sheath after removing the pigtail catheter. At conclusion of this the sheath was removed and hemostasis obtained with direct pressure. The patient tolerated procedure well and there were no complications. Patient was taken to the holding area in stable condition.  Operative findings:  #1 occluded right femoral to above-knee popliteal bypass with reconstitution of the above-knee popliteal artery  #2 patent left  femoral to above-knee popliteal  bypass with three-vessel runoff to the left foot  #3 patent aortobifemoral bypass with 25% narrowing of the proximal anastomosis.

## 2011-09-11 ENCOUNTER — Encounter (HOSPITAL_COMMUNITY): Payer: Self-pay | Admitting: Anesthesiology

## 2011-09-11 ENCOUNTER — Inpatient Hospital Stay (HOSPITAL_COMMUNITY): Payer: Medicare Other

## 2011-09-11 ENCOUNTER — Inpatient Hospital Stay (HOSPITAL_COMMUNITY): Payer: Medicare Other | Admitting: Anesthesiology

## 2011-09-11 ENCOUNTER — Encounter (HOSPITAL_COMMUNITY): Payer: Self-pay | Admitting: *Deleted

## 2011-09-11 ENCOUNTER — Encounter (HOSPITAL_COMMUNITY)
Admission: RE | Disposition: A | Discharge status: Home / Self Care | Payer: Self-pay | Source: Ambulatory Visit | Attending: Vascular Surgery

## 2011-09-11 ENCOUNTER — Inpatient Hospital Stay (HOSPITAL_COMMUNITY)
Admission: RE | Admit: 2011-09-11 | Discharge: 2011-09-14 | DRG: 253 | Disposition: A | Discharge status: Home / Self Care | Payer: Medicare Other | Source: Ambulatory Visit | Attending: Vascular Surgery | Admitting: Vascular Surgery

## 2011-09-11 DIAGNOSIS — K219 Gastro-esophageal reflux disease without esophagitis: Secondary | ICD-10-CM | POA: Diagnosis present

## 2011-09-11 DIAGNOSIS — T82898A Other specified complication of vascular prosthetic devices, implants and grafts, initial encounter: Secondary | ICD-10-CM

## 2011-09-11 DIAGNOSIS — Z79899 Other long term (current) drug therapy: Secondary | ICD-10-CM

## 2011-09-11 DIAGNOSIS — Y832 Surgical operation with anastomosis, bypass or graft as the cause of abnormal reaction of the patient, or of later complication, without mention of misadventure at the time of the procedure: Secondary | ICD-10-CM | POA: Diagnosis present

## 2011-09-11 DIAGNOSIS — D500 Iron deficiency anemia secondary to blood loss (chronic): Secondary | ICD-10-CM | POA: Diagnosis not present

## 2011-09-11 DIAGNOSIS — I10 Essential (primary) hypertension: Secondary | ICD-10-CM | POA: Diagnosis present

## 2011-09-11 DIAGNOSIS — Z87891 Personal history of nicotine dependence: Secondary | ICD-10-CM

## 2011-09-11 DIAGNOSIS — I739 Peripheral vascular disease, unspecified: Secondary | ICD-10-CM | POA: Diagnosis present

## 2011-09-11 DIAGNOSIS — I743 Embolism and thrombosis of arteries of the lower extremities: Secondary | ICD-10-CM | POA: Diagnosis present

## 2011-09-11 DIAGNOSIS — E039 Hypothyroidism, unspecified: Secondary | ICD-10-CM | POA: Diagnosis present

## 2011-09-11 DIAGNOSIS — E785 Hyperlipidemia, unspecified: Secondary | ICD-10-CM | POA: Diagnosis present

## 2011-09-11 DIAGNOSIS — Z7982 Long term (current) use of aspirin: Secondary | ICD-10-CM

## 2011-09-11 HISTORY — DX: Reserved for inherently not codable concepts without codable children: IMO0001

## 2011-09-11 HISTORY — DX: Encounter for other specified aftercare: Z51.89

## 2011-09-11 HISTORY — DX: Hypothyroidism, unspecified: E03.9

## 2011-09-11 HISTORY — PX: FEMORAL-POPLITEAL BYPASS GRAFT: SHX937

## 2011-09-11 LAB — CBC
HCT: 37.5 % (ref 36.0–46.0)
MCH: 30.1 pg (ref 26.0–34.0)
MCHC: 33.6 g/dL (ref 30.0–36.0)
RDW: 12.8 % (ref 11.5–15.5)

## 2011-09-11 LAB — CREATININE, SERUM: GFR calc non Af Amer: 75 mL/min — ABNORMAL LOW (ref >90)

## 2011-09-11 SURGERY — BYPASS GRAFT FEMORAL-POPLITEAL ARTERY
Anesthesia: General | Site: Leg Upper | Laterality: Right | Wound class: Clean

## 2011-09-11 MED ORDER — ALPRAZOLAM 0.25 MG PO TABS: 0.2500 mg | ORAL_TABLET | Freq: Every evening | ORAL | Status: DC | PRN

## 2011-09-11 MED ORDER — POLYETHYLENE GLYCOL 3350 17 G PO PACK
17.0000 g | PACK | Freq: Every day | ORAL | Status: DC | PRN
  Filled 2011-09-11: qty 1

## 2011-09-11 MED ORDER — FENTANYL CITRATE 0.05 MG/ML IJ SOLN
INTRAMUSCULAR | Status: DC | PRN
  Administered 2011-09-11 (×2): 50 ug via INTRAVENOUS
  Administered 2011-09-11: 100 ug via INTRAVENOUS
  Administered 2011-09-11: 50 ug via INTRAVENOUS

## 2011-09-11 MED ORDER — SODIUM CHLORIDE 0.9 % IR SOLN
Status: DC | PRN
  Administered 2011-09-11: 14:00:00

## 2011-09-11 MED ORDER — ACETAMINOPHEN 325 MG PO TABS: 325.0000 mg | ORAL_TABLET | ORAL | Status: DC | PRN

## 2011-09-11 MED ORDER — ONDANSETRON HCL 4 MG/2ML IJ SOLN
INTRAMUSCULAR | Status: AC
  Filled 2011-09-11: qty 2

## 2011-09-11 MED ORDER — HYDROMORPHONE HCL PF 1 MG/ML IJ SOLN
0.5000 mg | INTRAMUSCULAR | Status: DC | PRN
  Administered 2011-09-12: 0.5 mg via INTRAVENOUS
  Administered 2011-09-12: 1 mg via INTRAVENOUS
  Administered 2011-09-13: 0.5 mg via INTRAVENOUS
  Administered 2011-09-13 (×3): 1 mg via INTRAVENOUS
  Administered 2011-09-13 (×2): 0.5 mg via INTRAVENOUS
  Administered 2011-09-14: 1 mg via INTRAVENOUS
  Filled 2011-09-11 (×9): qty 1

## 2011-09-11 MED ORDER — GUAIFENESIN-DM 100-10 MG/5ML PO SYRP: 15.0000 mL | ORAL_SOLUTION | ORAL | Status: DC | PRN

## 2011-09-11 MED ORDER — SODIUM CHLORIDE 0.9 % IV SOLN: 500.0000 mL | Freq: Once | INTRAVENOUS | Status: AC | PRN

## 2011-09-11 MED ORDER — IOHEXOL 300 MG/ML  SOLN
INTRAMUSCULAR | Status: DC | PRN
  Administered 2011-09-11: 20 mL via INTRAVENOUS

## 2011-09-11 MED ORDER — LACTATED RINGERS IV SOLN
INTRAVENOUS | Status: DC | PRN
  Administered 2011-09-11 (×2): via INTRAVENOUS

## 2011-09-11 MED ORDER — LABETALOL HCL 5 MG/ML IV SOLN: 10.0000 mg | INTRAVENOUS | Status: DC | PRN

## 2011-09-11 MED ORDER — METOPROLOL TARTRATE 1 MG/ML IV SOLN: 2.0000 mg | INTRAVENOUS | Status: DC | PRN

## 2011-09-11 MED ORDER — MAGNESIUM HYDROXIDE 400 MG/5ML PO SUSP: 30.0000 mL | ORAL | Status: DC | PRN

## 2011-09-11 MED ORDER — DEXTROSE 5 % IV SOLN
1.5000 g | Freq: Two times a day (BID) | INTRAVENOUS | Status: AC
  Administered 2011-09-11 – 2011-09-12 (×2): 1.5 g via INTRAVENOUS
  Filled 2011-09-11 (×3): qty 1.5

## 2011-09-11 MED ORDER — SODIUM CHLORIDE 0.9 % IR SOLN
Status: DC | PRN
  Administered 2011-09-11: 1000 mL

## 2011-09-11 MED ORDER — DOCUSATE SODIUM 100 MG PO CAPS
100.0000 mg | ORAL_CAPSULE | Freq: Every day | ORAL | Status: DC
  Administered 2011-09-12 – 2011-09-13 (×2): 100 mg via ORAL
  Filled 2011-09-11: qty 1

## 2011-09-11 MED ORDER — HYDRALAZINE HCL 20 MG/ML IJ SOLN
10.0000 mg | INTRAMUSCULAR | Status: DC | PRN
  Filled 2011-09-11: qty 0.5

## 2011-09-11 MED ORDER — BISACODYL 10 MG RE SUPP: 10.0000 mg | Freq: Every day | RECTAL | Status: DC | PRN

## 2011-09-11 MED ORDER — EPHEDRINE SULFATE 50 MG/ML IJ SOLN
INTRAMUSCULAR | Status: DC | PRN
  Administered 2011-09-11: 10 mg via INTRAVENOUS
  Administered 2011-09-11: 5 mg via INTRAVENOUS

## 2011-09-11 MED ORDER — TEMAZEPAM 15 MG PO CAPS: 15.0000 mg | ORAL_CAPSULE | Freq: Every evening | ORAL | Status: DC | PRN

## 2011-09-11 MED ORDER — PHENOL 1.4 % MT LIQD: 1.0000 | OROMUCOSAL | Status: DC | PRN

## 2011-09-11 MED ORDER — DEXTROSE 5 % IV SOLN
1.5000 g | INTRAVENOUS | Status: DC | PRN
  Administered 2011-09-11: 1.5 g via INTRAVENOUS

## 2011-09-11 MED ORDER — ALUM & MAG HYDROXIDE-SIMETH 400-400-40 MG/5ML PO SUSP
15.0000 mL | ORAL | Status: DC | PRN
  Filled 2011-09-11: qty 30

## 2011-09-11 MED ORDER — FAMOTIDINE IN NACL 20-0.9 MG/50ML-% IV SOLN
20.0000 mg | Freq: Two times a day (BID) | INTRAVENOUS | Status: DC
  Administered 2011-09-11 – 2011-09-13 (×3): 20 mg via INTRAVENOUS
  Filled 2011-09-11 (×4): qty 50

## 2011-09-11 MED ORDER — MAGNESIUM SULFATE 50 % IJ SOLN
2.0000 g | Freq: Once | INTRAVENOUS | Status: DC | PRN
  Filled 2011-09-11: qty 4

## 2011-09-11 MED ORDER — MAGNESIUM SULFATE 40 MG/ML IJ SOLN: 2.0000 g | Freq: Once | INTRAMUSCULAR | Status: AC | PRN

## 2011-09-11 MED ORDER — DEXTROSE 5 % IV SOLN
1.5000 g | Freq: Once | INTRAVENOUS | Status: DC
  Filled 2011-09-11: qty 1.5

## 2011-09-11 MED ORDER — ONDANSETRON HCL 4 MG/2ML IJ SOLN
4.0000 mg | Freq: Four times a day (QID) | INTRAMUSCULAR | Status: DC | PRN
  Administered 2011-09-11: 20:00:00 via INTRAVENOUS
  Administered 2011-09-12: 4 mg via INTRAVENOUS
  Filled 2011-09-11: qty 2

## 2011-09-11 MED ORDER — PHENYLEPHRINE HCL 10 MG/ML IJ SOLN
10000.0000 ug | INTRAVENOUS | Status: DC | PRN
  Administered 2011-09-11: 50 ug/min via INTRAVENOUS

## 2011-09-11 MED ORDER — SIMVASTATIN 40 MG PO TABS
40.0000 mg | ORAL_TABLET | Freq: Every day | ORAL | Status: DC
  Administered 2011-09-11 – 2011-09-13 (×2): 40 mg via ORAL
  Filled 2011-09-11 (×4): qty 1

## 2011-09-11 MED ORDER — HEPARIN SODIUM (PORCINE) 1000 UNIT/ML IJ SOLN
INTRAMUSCULAR | Status: DC | PRN
  Administered 2011-09-11: 6000 [IU] via INTRAVENOUS

## 2011-09-11 MED ORDER — ACETAMINOPHEN 650 MG RE SUPP: 325.0000 mg | RECTAL | Status: DC | PRN

## 2011-09-11 MED ORDER — LEVOTHYROXINE SODIUM 112 MCG PO TABS
112.0000 ug | ORAL_TABLET | Freq: Every day | ORAL | Status: DC
  Administered 2011-09-12 – 2011-09-14 (×3): 112 ug via ORAL
  Filled 2011-09-11 (×5): qty 1

## 2011-09-11 MED ORDER — PROTAMINE SULFATE 10 MG/ML IV SOLN
INTRAVENOUS | Status: DC | PRN
  Administered 2011-09-11: 45 mg via INTRAVENOUS
  Administered 2011-09-11: 5 mg via INTRAVENOUS

## 2011-09-11 MED ORDER — HYDROMORPHONE HCL PF 1 MG/ML IJ SOLN
0.2500 mg | INTRAMUSCULAR | Status: DC | PRN
  Administered 2011-09-11 (×2): 0.25 mg via INTRAVENOUS
  Administered 2011-09-11: 0.5 mg via INTRAVENOUS

## 2011-09-11 MED ORDER — METOPROLOL TARTRATE 1 MG/ML IV SOLN
INTRAVENOUS | Status: DC | PRN
  Administered 2011-09-11: 2 mg via INTRAVENOUS
  Administered 2011-09-11: 1 mg via INTRAVENOUS

## 2011-09-11 MED ORDER — PHENYLEPHRINE HCL 10 MG/ML IJ SOLN
INTRAMUSCULAR | Status: DC | PRN
  Administered 2011-09-11 (×4): 80 ug via INTRAVENOUS
  Administered 2011-09-11 (×2): 40 ug via INTRAVENOUS

## 2011-09-11 MED ORDER — ROCURONIUM BROMIDE 100 MG/10ML IV SOLN
INTRAVENOUS | Status: DC | PRN
  Administered 2011-09-11: 50 mg via INTRAVENOUS

## 2011-09-11 MED ORDER — ENOXAPARIN SODIUM 40 MG/0.4ML ~~LOC~~ SOLN
40.0000 mg | SUBCUTANEOUS | Status: DC
  Filled 2011-09-11: qty 0.4

## 2011-09-11 MED ORDER — MIDAZOLAM HCL 5 MG/5ML IJ SOLN
INTRAMUSCULAR | Status: DC | PRN
  Administered 2011-09-11: 2 mg via INTRAVENOUS

## 2011-09-11 MED ORDER — ASPIRIN 81 MG PO TABS: 81.0000 mg | ORAL_TABLET | Freq: Every day | ORAL | Status: DC

## 2011-09-11 MED ORDER — GLYCOPYRROLATE 0.2 MG/ML IJ SOLN
INTRAMUSCULAR | Status: DC | PRN
  Administered 2011-09-11: 0.2 mg via INTRAVENOUS

## 2011-09-11 MED ORDER — OXYCODONE-ACETAMINOPHEN 5-325 MG PO TABS: 1.0000 | ORAL_TABLET | ORAL | Status: DC | PRN

## 2011-09-11 MED ORDER — DROPERIDOL 2.5 MG/ML IJ SOLN: 0.6250 mg | INTRAMUSCULAR | Status: DC | PRN

## 2011-09-11 MED ORDER — LACTATED RINGERS IV SOLN
INTRAVENOUS | Status: DC
  Administered 2011-09-11: 11:00:00 via INTRAVENOUS

## 2011-09-11 MED ORDER — NEOSTIGMINE METHYLSULFATE 1 MG/ML IJ SOLN
INTRAMUSCULAR | Status: DC | PRN
  Administered 2011-09-11: 3 mg via INTRAVENOUS

## 2011-09-11 MED ORDER — DEXTROSE-NACL 5-0.9 % IV SOLN
INTRAVENOUS | Status: DC
  Administered 2011-09-11 – 2011-09-12 (×3): via INTRAVENOUS

## 2011-09-11 MED ORDER — PROPOFOL 10 MG/ML IV EMUL
INTRAVENOUS | Status: DC | PRN
  Administered 2011-09-11: 130 mg via INTRAVENOUS

## 2011-09-11 MED ORDER — DOPAMINE-DEXTROSE 3.2-5 MG/ML-% IV SOLN
3.0000 ug/kg/min | INTRAVENOUS | Status: DC | PRN
  Filled 2011-09-11: qty 250

## 2011-09-11 MED ORDER — ONDANSETRON HCL 4 MG/2ML IJ SOLN
INTRAMUSCULAR | Status: DC | PRN
  Administered 2011-09-11: 4 mg via INTRAVENOUS

## 2011-09-11 MED ORDER — POTASSIUM CHLORIDE CRYS ER 20 MEQ PO TBCR: 20.0000 meq | EXTENDED_RELEASE_TABLET | Freq: Once | ORAL | Status: AC | PRN

## 2011-09-11 SURGICAL SUPPLY — 56 items
APL SKNCLS STERI-STRIP NONHPOA (GAUZE/BANDAGES/DRESSINGS) ×1
BANDAGE ESMARK 6X9 LF (GAUZE/BANDAGES/DRESSINGS) IMPLANT
BENZOIN TINCTURE PRP APPL 2/3 (GAUZE/BANDAGES/DRESSINGS) ×2 IMPLANT
BNDG CMPR 9X6 STRL LF SNTH (GAUZE/BANDAGES/DRESSINGS)
BNDG ESMARK 6X9 LF (GAUZE/BANDAGES/DRESSINGS)
CANISTER SUCTION 2500CC (MISCELLANEOUS) ×2 IMPLANT
CATH EMB 4FR 80CM (CATHETERS) ×1 IMPLANT
CLIP FOGARTY SPRING 6M (CLIP) IMPLANT
CLOTH BEACON ORANGE TIMEOUT ST (SAFETY) ×2 IMPLANT
COVER SURGICAL LIGHT HANDLE (MISCELLANEOUS) ×4 IMPLANT
CUFF TOURNIQUET SINGLE 34IN LL (TOURNIQUET CUFF) IMPLANT
CUFF TOURNIQUET SINGLE 44IN (TOURNIQUET CUFF) IMPLANT
DRAIN SNY 10X20 3/4 PERF (WOUND CARE) IMPLANT
DRAPE WARM FLUID 44X44 (DRAPE) ×2 IMPLANT
DRAPE X-RAY CASS 24X20 (DRAPES) ×1 IMPLANT
DRSG COVADERM 4X10 (GAUZE/BANDAGES/DRESSINGS) IMPLANT
DRSG COVADERM 4X6 (GAUZE/BANDAGES/DRESSINGS) ×1 IMPLANT
DRSG COVADERM 4X8 (GAUZE/BANDAGES/DRESSINGS) IMPLANT
ELECT REM PT RETURN 9FT ADLT (ELECTROSURGICAL) ×2
ELECTRODE REM PT RTRN 9FT ADLT (ELECTROSURGICAL) ×1 IMPLANT
EVACUATOR SILICONE 100CC (DRAIN) IMPLANT
GLOVE BIO SURGEON STRL SZ 6.5 (GLOVE) ×1 IMPLANT
GLOVE BIOGEL PI IND STRL 7.0 (GLOVE) IMPLANT
GLOVE BIOGEL PI IND STRL 7.5 (GLOVE) IMPLANT
GLOVE BIOGEL PI INDICATOR 7.0 (GLOVE) ×3
GLOVE BIOGEL PI INDICATOR 7.5 (GLOVE) ×2
GLOVE SS BIOGEL STRL SZ 7.5 (GLOVE) ×1 IMPLANT
GLOVE SUPERSENSE BIOGEL SZ 7.5 (GLOVE) ×1
GOWN BRE IMP SLV AUR XL STRL (GOWN DISPOSABLE) ×1 IMPLANT
GOWN STRL NON-REIN LRG LVL3 (GOWN DISPOSABLE) ×6 IMPLANT
INSERT FOGARTY SM (MISCELLANEOUS) IMPLANT
KIT BASIN OR (CUSTOM PROCEDURE TRAY) ×2 IMPLANT
KIT ROOM TURNOVER OR (KITS) ×2 IMPLANT
NS IRRIG 1000ML POUR BTL (IV SOLUTION) ×4 IMPLANT
PACK PERIPHERAL VASCULAR (CUSTOM PROCEDURE TRAY) ×2 IMPLANT
PAD ARMBOARD 7.5X6 YLW CONV (MISCELLANEOUS) ×2 IMPLANT
PADDING CAST COTTON 6X4 STRL (CAST SUPPLIES) IMPLANT
SET COLLECT BLD 21X3/4 12 (NEEDLE) ×1 IMPLANT
SET COLLECT BLD 21X3/4 12 PB (MISCELLANEOUS) ×1 IMPLANT
SPONGE GAUZE 4X4 12PLY (GAUZE/BANDAGES/DRESSINGS) ×1 IMPLANT
STAPLER VISISTAT 35W (STAPLE) IMPLANT
STOPCOCK 4 WAY LG BORE MALE ST (IV SETS) ×1 IMPLANT
STRIP CLOSURE SKIN 1/2X4 (GAUZE/BANDAGES/DRESSINGS) ×2 IMPLANT
SUT ETHILON 3 0 PS 1 (SUTURE) IMPLANT
SUT PROLENE 5 0 C 1 24 (SUTURE) ×2 IMPLANT
SUT PROLENE 6 0 CC (SUTURE) ×3 IMPLANT
SUT SILK 2 0 SH (SUTURE) ×2 IMPLANT
SUT VIC AB 2-0 CTX 36 (SUTURE) ×4 IMPLANT
SUT VIC AB 3-0 SH 27 (SUTURE) ×6
SUT VIC AB 3-0 SH 27X BRD (SUTURE) ×2 IMPLANT
TOWEL OR 17X24 6PK STRL BLUE (TOWEL DISPOSABLE) ×4 IMPLANT
TOWEL OR 17X26 10 PK STRL BLUE (TOWEL DISPOSABLE) ×4 IMPLANT
TRAY FOLEY CATH 14FRSI W/METER (CATHETERS) ×2 IMPLANT
TUBING EXTENTION W/L.L. (IV SETS) ×1 IMPLANT
UNDERPAD 30X30 INCONTINENT (UNDERPADS AND DIAPERS) ×2 IMPLANT
WATER STERILE IRR 1000ML POUR (IV SOLUTION) ×2 IMPLANT

## 2011-09-11 NOTE — Op Note (Signed)
NAMEVAUNDA, GUTTERMAN                  ACCOUNT NO.:  [id number]  MEDICAL RECORD NO.:  [id number]  LOCATION:  3305                         FACILITY:  [facility]  PHYSICIAN:  Larina Earthly, M.D.    DATE OF BIRTH:  Oct 12, 1946  DATE OF PROCEDURE:  09/11/2011 DATE OF DISCHARGE:                              OPERATIVE REPORT   PREOPERATIVE DIAGNOSIS:  Occluded right femoral-popliteal bypass.  POSTOPERATIVE DIAGNOSIS:  Occluded right femoral-popliteal bypass.  PROCEDURE:  Thrombectomy of right femoral-popliteal bypass, intraoperative arteriogram.  SURGEON:  Larina Earthly, MD  ASSISTANT:  Della Goo, PA-C  ANESTHESIA:  General endotracheal.  COMPLICATIONS:  None.  DISPOSITION:  To recovery room stable.  PROCEDURE:  The patient was taken to the operating room and placed in supine position where the area of the right groin and right leg were prepped and draped in usual sterile fashion.  Incision was made through the prior scar in the right groin and carried down to isolate the [facility] graft which was coming off the aortobifemoral right fem graft.  The patient was given 7000 units of intravenous heparin.  The graft was opened transversely near the arterial anastomosis.  The distal graft was thrombectomized, and the arterialized plug was removed and a brisk back bleeding was encountered.  This was flushed with heparinized saline and reoccluded.  Next, the proximal portion of the graft was thrombectomized.  There was some resistance at the Gore-Tex to the Dacron aortofem bypass.  A 5 dilator would not pass through this area. For this reason, the proximal anastomosis was exposed by isolating the right limb of the aortofemoral graft and the anastomosis.  Digital pressure was used to control the Dacron graft proximal to the old anastomosis and Fogarty catheter was again passed through this area and that was removed, adherent thrombus was removed at this juncture and then arterialized plug  was removed.  Excellent flow was obtained and a 5 dilator passed easily through this.  The graft was re-occluded. Intraoperative arteriogram was obtained with a vessel cannula through the distal portion of the graft and this revealed good patency of the graft to the above-knee popliteal artery.  There was some spasm at the anastomosis with a change in caliber from the 6 mm graft to the artery. There was excellent runoff.  The incision of the graft was closed with a running 6-0 Prolene suture.  After the usual flushing maneuvers, the anastomosis completed, clamps removed and excellent dorsalis pedis pulse was encountered.  The patient was given 50 mg of protamine to reverse heparin.  The wounds were irrigated with saline.  Hemostasis with electrocautery.  Wounds were closed with 2-0 Vicryl in several layers in  the subcutaneous tissue, skin was closed with 3-0 subcuticular Vicryl stitch.  Sterile dressing was applied, and the patient was taken to the recovery room in stable condition.     Larina Earthly, M.D.     TFE/MEDQ  D:  09/11/2011  T:  09/11/2011  Job:  [id number]

## 2011-09-11 NOTE — H&P (View-Only) (Signed)
[name] HISTORY AND PHYSICAL   History of Present Illness:  [name] who presents for evaluation of claudication.  The patient currently describes a cramping sensation in the right lower extremity. There is not a component of rest pain.  There is no history of ulcerations on the feet.  The patient had been doing well until about 5-7 days ago when after taking a 2 mile walk she noticed that she can only walk about one to 2 blocks reports been experiencing claudication in the right leg.  Prior to this she could walk unlimited distances. She has a complicated previous vascular history with bilateral femoral endarterectomies by Dr. Early in June of 2009 followed by a left external iliac to left profunda bypass in November of 2009. She subsequently had an aortobifemoral bypass with bilateral femoral to above-knee popliteal bypasses in May of 2010. Atherosclerotic risk factors and other medical problems include hyperlipidemia, hypertension. She quit smoking in 2002.  Past Medical History  Diagnosis Date  . Peripheral arterial disease   . Hyperlipidemia   . Hypertension   . GERD (gastroesophageal reflux disease)   . Thyroid disease     Hypothyroidism    Past Surgical History  Procedure Date  . Femoral endarterectomy 04-07-08    bilateral common femoral arteries  . Pr vein bypass graft,aorto-fem-pop 03/02/09    Aortobifemoral BPG, Bilateral fem- popliteal BPG    ROS:   General:  No weight loss, Fever, chills  HEENT: No recent headaches, no nasal bleeding, no visual changes, no sore throat  Neurologic: No dizziness, blackouts, seizures. No recent symptoms of stroke or mini- stroke. No recent episodes of slurred speech, or temporary blindness.  Cardiac: No recent episodes of chest pain/pressure, no shortness of breath at rest.  No shortness of breath with exertion.  Denies history of atrial fibrillation or irregular  heartbeat  Vascular: No history of rest pain in feet.  No history of claudication.  No history of non-healing ulcer, No history of DVT   Pulmonary: No home oxygen, no productive cough, no hemoptysis,  No asthma or wheezing  Musculoskeletal:  [ ] Arthritis, [ ] Low back pain,  [ ] Joint pain  Hematologic:No history of hypercoagulable state.  No history of easy bleeding.  No history of anemia  Gastrointestinal: No hematochezia or melena,  No gastroesophageal reflux, no trouble swallowing  Urinary: [ ] chronic Kidney disease, [ ] on HD - [ ] MWF or [ ] TTHS, [ ] Burning with urination, [ ] Frequent urination, [ ] Difficulty urinating;   Skin: No rashes  Psychological: No history of anxiety,  No history of depression  Social History History  Substance Use Topics  . Smoking status: Former Smoker    Quit date: 09/06/2001  . Smokeless tobacco: Not on file  . Alcohol Use: No    Family History History reviewed. No pertinent family history.  Allergies  Allergies  Allergen Reactions  . Morphine And Related Nausea And Vomiting     Current Outpatient Prescriptions  Medication Sig Dispense Refill  . aspirin 81 MG tablet Take 81 mg by mouth daily.        . levothyroxine (SYNTHROID, LEVOTHROID) 112 MCG tablet Take 112 mcg by mouth daily.        . simvastatin (ZOCOR) 40 MG tablet Take 40 mg by mouth at bedtime.          Physical Examination  Filed Vitals:     09/07/11 1423  BP: 161/72  Pulse: 79  Resp: 20  Height: 5' 1" (1.549 m)  Weight: 129 lb (58.514 kg)  SpO2: 98%    Body mass index is 24.37 kg/(m^2).  General:  Alert and oriented, no acute distress HEENT: Normal Neck: No bruit or JVD Pulmonary: Clear to auscultation bilaterally Cardiac: Regular Rate and Rhythm without murmur Abdomen: Soft, non-tender, non-distended, no mass, no scars Skin: No rash, right foot is pink and warm with brisk capillary refill Extremity Pulses:  2+ radial, brachial, femoral  pulses  bilaterally, space she has a 2+ left popliteal pulse and a 1+ left dorsalis pedis pulse, she has absent popliteal and pedal pulses in the right foot Musculoskeletal: No deformity or edema  Neurologic: Upper and lower extremity motor 5/5 and symmetric  DATA:  Patient had a graft duplex scan today which shows a right femoropopliteal graft is occluded. The left femoropopliteal was patent with no increased velocities. ABI on the right side is now 0.7 compared to 1.05 previously. Heavy on the left side is 1.27 compared to 1.08 previously  ASSESSMENT: Short distance claudication right lower extremity. The patient has a complicated vascular history with multiple prior procedures. I did discuss with her current symptoms and she states that she would rather have an operation then continued in her present state. She also realizes that durability obviously has been a problem with her. In light of her previous multiple operations I believe she needs an arteriogram for further evaluation of her right lower extremity. Subsequent to this I will place her on the OR schedule for Dr. Early to either do a thrombectomy and revision of her right femoropopliteal or consider a more distal bypass for Monday, 09/11/2011. Risks benefits possible complications and procedure details of her arteriogram including but not limited to bleeding infection contrast reaction vessel injury were explained the patient today. Also risk of infection of her prosthetic graft as well as thrombosis of her femoropopliteal bypass were explained. I also went over some of the risks benefits and possible complications of a redo in her right femoropopliteal bypass and informed her that Dr. Early would discuss this further with her as well.   PLAN:   [name] Vezina, MD [facility] [place] Office: [phone] Pager: [phone]  

## 2011-09-11 NOTE — Transfer of Care (Signed)
Immediate Anesthesia Transfer of Care Note  Patient: [name] [name]  Procedure(s) Performed:  BYPASS GRAFT FEMORAL-POPLITEAL ARTERY - Thrombectomy right femoral popliteal bypass with intraoperative arteriogram  Patient Location: PACU  Anesthesia Type: General  Level of Consciousness: awake, oriented and patient cooperative  Airway & Oxygen Therapy: Patient Spontanous Breathing and Patient connected to nasal cannula oxygen  Post-op Assessment: Report given to PACU RN and Post -op Vital signs reviewed and stable  Post vital signs: Reviewed and stable  Complications: No apparent anesthesia complications

## 2011-09-11 NOTE — Brief Op Note (Signed)
09/11/2011  2:26 PM  PATIENT:  [name] [name]  65 y.o. female  PRE-OPERATIVE DIAGNOSIS:  Occluded BPG;PVD  POST-OPERATIVE DIAGNOSIS:  Occluded BPG;PVD  PROCEDURE:  Procedure(s): Thrombectomy of right BYPASS GRAFT FEMORAL-POPLITEAL ARTERY  SURGEON:  Surgeon(s): Larina Earthly, MD  PHYSICIAN ASSISTANT: R. Roczniak,PAC  ANESTHESIA:   general  EBL:  Total I/O In: 1000 [I.V.:1000] Out: 560 [Urine:260; Blood:300]  BLOOD ADMINISTERED:none  DRAINS: none   LOCAL MEDICATIONS USED:  NONE  SPECIMEN:  No Specimen  DISPOSITION OF SPECIMEN:  N/A  COUNTS:  YES  TOURNIQUET:  * No tourniquets in log *  DICTATION: .161096  PLAN OF CARE: Admit to inpatient   PATIENT DISPOSITION:  PACU - hemodynamically stable.   Delay start of Pharmacological VTE agent (>24hrs) due to surgical blood loss or risk of bleeding:  {YES/NO/NOT APPLICABLE:20182

## 2011-09-11 NOTE — Preoperative (Signed)
Beta Blockers   Reason not to administer Beta Blockers:Not Applicable 

## 2011-09-11 NOTE — Interval H&P Note (Signed)
History and Physical Interval Note:   09/11/2011   12:14 PM   [name] [name]  has presented today for surgery, with the diagnosis of Occluded BPG;PVD  The various methods of treatment have been discussed with the patient and family. After consideration of risks, benefits and other options for treatment, the patient has consented to  Procedure(s): BYPASS GRAFT FEMORAL-POPLITEAL ARTERY as a surgical intervention .  The patients' history has been reviewed, patient examined, no change in status, stable for surgery.  I have reviewed the patients' chart and labs.  Questions were answered to the patient's satisfaction.     Larina Earthly  MD

## 2011-09-11 NOTE — Anesthesia Preprocedure Evaluation (Addendum)
Anesthesia Evaluation  Patient identified by MRN, date of birth, ID band Patient awake    Reviewed: Allergy & Precautions, H&P , NPO status , Patient's Chart, lab work & pertinent test results  History of Anesthesia Complications Negative for: history of anesthetic complications  Airway Mallampati: II TM Distance: >3 FB Neck ROM: Full    Dental  (+) Edentulous Upper, Teeth Intact and Dental Advisory Given   Pulmonary neg pulmonary ROS,  clear to auscultation  Pulmonary exam normal       Cardiovascular hypertension, Regular Normal- Systolic murmurs    Neuro/Psych Negative Neurological ROS     GI/Hepatic Neg liver ROS, GERD-  Controlled and Medicated,  Endo/Other  Hypothyroidism   Renal/GU negative Renal ROS     Musculoskeletal   Abdominal   Peds  Hematology   Anesthesia Other Findings   Reproductive/Obstetrics                          Anesthesia Physical Anesthesia Plan  ASA: III  Anesthesia Plan: General   Post-op Pain Management:    Induction: Intravenous  Airway Management Planned: Oral ETT  Additional Equipment:   Intra-op Plan:   Post-operative Plan: Extubation in OR  Informed Consent: I have reviewed the patients History and Physical, chart, labs and discussed the procedure including the risks, benefits and alternatives for the proposed anesthesia with the patient or authorized representative who has indicated his/her understanding and acceptance.   Dental advisory given  Plan Discussed with: CRNA and Surgeon  Anesthesia Plan Comments:         Anesthesia Quick Evaluation

## 2011-09-11 NOTE — Progress Notes (Signed)
Pharmacy- Antibiotic Renal Monitoring Antibiotic doses reviewed for renal function; doses ordered appropriate for current renal function.  Pharmacy will sign off.  Thank you. Junita Push, PharmD, BCPS

## 2011-09-11 NOTE — Anesthesia Procedure Notes (Addendum)
Procedure Name: Intubation Date/Time: 09/11/2011 1:03 PM Performed by: Leona Singleton A. Oxygen Delivery Method: Circle System Utilized Preoxygenation: Pre-oxygenation with 100% oxygen Intubation Type: IV induction Ventilation: Mask ventilation without difficulty and Oral airway inserted - appropriate to patient size Laryngoscope Size: Hyacinth Meeker and 2 Grade View: Grade I Tube type: Oral Tube size: 7.0 mm Number of attempts: 1 Placement Confirmation: ETT inserted through vocal cords under direct vision,  positive ETCO2 and breath sounds checked- equal and bilateral Secured at: 21 cm Tube secured with: Tape Dental Injury: Teeth and Oropharynx as per pre-operative assessment

## 2011-09-12 ENCOUNTER — Encounter (HOSPITAL_COMMUNITY): Payer: Self-pay | Admitting: Anesthesiology

## 2011-09-12 ENCOUNTER — Inpatient Hospital Stay (HOSPITAL_COMMUNITY): Payer: Medicare Other | Admitting: Anesthesiology

## 2011-09-12 ENCOUNTER — Encounter (HOSPITAL_COMMUNITY): Payer: Self-pay

## 2011-09-12 ENCOUNTER — Encounter (HOSPITAL_COMMUNITY)
Admission: RE | Disposition: A | Discharge status: Home / Self Care | Payer: Self-pay | Source: Ambulatory Visit | Attending: Vascular Surgery

## 2011-09-12 DIAGNOSIS — I739 Peripheral vascular disease, unspecified: Secondary | ICD-10-CM

## 2011-09-12 DIAGNOSIS — T82898A Other specified complication of vascular prosthetic devices, implants and grafts, initial encounter: Secondary | ICD-10-CM

## 2011-09-12 DIAGNOSIS — Z48812 Encounter for surgical aftercare following surgery on the circulatory system: Secondary | ICD-10-CM

## 2011-09-12 HISTORY — PX: EMBOLECTOMY: SHX44

## 2011-09-12 LAB — CBC
HCT: 34.2 % — ABNORMAL LOW (ref 36.0–46.0)
Hemoglobin: 11.5 g/dL — ABNORMAL LOW (ref 12.0–15.0)
RBC: 3.78 MIL/uL — ABNORMAL LOW (ref 3.87–5.11)

## 2011-09-12 LAB — BASIC METABOLIC PANEL
BUN: 8 mg/dL (ref 6–23)
CO2: 26 mEq/L (ref 19–32)
Glucose, Bld: 129 mg/dL — ABNORMAL HIGH (ref 70–99)
Potassium: 4.2 mEq/L (ref 3.5–5.1)
Sodium: 137 mEq/L (ref 135–145)

## 2011-09-12 SURGERY — EMBOLECTOMY
Anesthesia: General | Site: Leg Upper | Laterality: Right | Wound class: Clean

## 2011-09-12 MED ORDER — CEFUROXIME SODIUM 1.5 G IJ SOLR
INTRAMUSCULAR | Status: AC
  Filled 2011-09-12: qty 1.5

## 2011-09-12 MED ORDER — INFLUENZA VIRUS VACC SPLIT PF IM SUSP
0.5000 mL | INTRAMUSCULAR | Status: AC
  Administered 2011-09-13: 0.5 mL via INTRAMUSCULAR
  Filled 2011-09-12: qty 0.5

## 2011-09-12 MED ORDER — ONDANSETRON HCL 4 MG/2ML IJ SOLN: 4.0000 mg | Freq: Four times a day (QID) | INTRAMUSCULAR | Status: DC | PRN

## 2011-09-12 MED ORDER — SODIUM CHLORIDE 0.9 % IR SOLN
Status: DC | PRN
  Administered 2011-09-12: 19:00:00

## 2011-09-12 MED ORDER — HYDROMORPHONE HCL PF 1 MG/ML IJ SOLN: 0.2500 mg | INTRAMUSCULAR | Status: DC | PRN

## 2011-09-12 MED ORDER — PROPOFOL 10 MG/ML IV EMUL
INTRAVENOUS | Status: DC | PRN
  Administered 2011-09-12: 150 mg via INTRAVENOUS

## 2011-09-12 MED ORDER — DEXTROSE 5 % IV SOLN
INTRAVENOUS | Status: DC | PRN
  Administered 2011-09-12: 19:00:00 via INTRAVENOUS

## 2011-09-12 MED ORDER — ROCURONIUM BROMIDE 100 MG/10ML IV SOLN
INTRAVENOUS | Status: DC | PRN
Start: 2011-09-12 — End: 2011-09-12
  Administered 2011-09-12: 40 mg via INTRAVENOUS
  Administered 2011-09-12: 10 mg via INTRAVENOUS

## 2011-09-12 MED ORDER — LACTATED RINGERS IV SOLN
INTRAVENOUS | Status: DC | PRN
  Administered 2011-09-12 (×3): via INTRAVENOUS

## 2011-09-12 MED ORDER — PHENYLEPHRINE HCL 10 MG/ML IJ SOLN
INTRAMUSCULAR | Status: DC | PRN
  Administered 2011-09-12: 6000 ug via INTRAVENOUS
  Administered 2011-09-12: 80 ug via INTRAVENOUS

## 2011-09-12 MED ORDER — SODIUM CHLORIDE 0.9 % IR SOLN
Status: DC | PRN
  Administered 2011-09-12: 1000 mL

## 2011-09-12 MED ORDER — PROTAMINE SULFATE 10 MG/ML IV SOLN
INTRAVENOUS | Status: DC | PRN
  Administered 2011-09-12: 50 mg via INTRAVENOUS

## 2011-09-12 MED ORDER — MIDAZOLAM HCL 5 MG/5ML IJ SOLN
INTRAMUSCULAR | Status: DC | PRN
  Administered 2011-09-12: 2 mg via INTRAVENOUS

## 2011-09-12 MED ORDER — HEMOSTATIC AGENTS (NO CHARGE) OPTIME
TOPICAL | Status: DC | PRN
  Administered 2011-09-12: 1 via TOPICAL

## 2011-09-12 MED ORDER — HEPARIN SODIUM (PORCINE) 1000 UNIT/ML IJ SOLN
INTRAMUSCULAR | Status: DC | PRN
  Administered 2011-09-12: 3000 [IU] via INTRAVENOUS
  Administered 2011-09-12: 5000 [IU] via INTRAVENOUS

## 2011-09-12 MED ORDER — FENTANYL CITRATE 0.05 MG/ML IJ SOLN
INTRAMUSCULAR | Status: DC | PRN
  Administered 2011-09-12: 100 ug via INTRAVENOUS
  Administered 2011-09-12 (×3): 50 ug via INTRAVENOUS

## 2011-09-12 MED ORDER — DEXTROSE 5 % IV SOLN
1.5000 g | INTRAVENOUS | Status: DC | PRN
  Administered 2011-09-12: 1.5 g via INTRAVENOUS

## 2011-09-12 MED ORDER — DEXTROSE 5 % IV SOLN
INTRAVENOUS | Status: AC
  Filled 2011-09-12: qty 50

## 2011-09-12 MED ORDER — GLYCOPYRROLATE 0.2 MG/ML IJ SOLN
INTRAMUSCULAR | Status: DC | PRN
  Administered 2011-09-12: .4 mg via INTRAVENOUS

## 2011-09-12 MED ORDER — NEOSTIGMINE METHYLSULFATE 1 MG/ML IJ SOLN
INTRAMUSCULAR | Status: DC | PRN
  Administered 2011-09-12: 3.5 mg via INTRAVENOUS

## 2011-09-12 SURGICAL SUPPLY — 67 items
ADH SKN CLS APL DERMABOND .7 (GAUZE/BANDAGES/DRESSINGS) ×1
BANDAGE ELASTIC 4 VELCRO ST LF (GAUZE/BANDAGES/DRESSINGS) IMPLANT
BANDAGE ESMARK 6X9 LF (GAUZE/BANDAGES/DRESSINGS) IMPLANT
BNDG CMPR 9X6 STRL LF SNTH (GAUZE/BANDAGES/DRESSINGS) ×1
BNDG ESMARK 6X9 LF (GAUZE/BANDAGES/DRESSINGS) ×2
CANISTER SUCTION 2500CC (MISCELLANEOUS) ×2 IMPLANT
CATH EMB 3FR 80CM (CATHETERS) IMPLANT
CATH EMB 4FR 80CM (CATHETERS) ×1 IMPLANT
CATH EMB 5FR 80CM (CATHETERS) ×1 IMPLANT
CLIP TI MEDIUM 24 (CLIP) ×2 IMPLANT
CLIP TI WIDE RED SMALL 24 (CLIP) ×2 IMPLANT
CLOTH BEACON ORANGE TIMEOUT ST (SAFETY) ×2 IMPLANT
COVER SURGICAL LIGHT HANDLE (MISCELLANEOUS) ×4 IMPLANT
CUFF TOURNIQUET SINGLE 24IN (TOURNIQUET CUFF) IMPLANT
CUFF TOURNIQUET SINGLE 34IN LL (TOURNIQUET CUFF) ×1 IMPLANT
CUFF TOURNIQUET SINGLE 44IN (TOURNIQUET CUFF) IMPLANT
DERMABOND ADVANCED (GAUZE/BANDAGES/DRESSINGS) ×1
DERMABOND ADVANCED .7 DNX12 (GAUZE/BANDAGES/DRESSINGS) ×1 IMPLANT
DRAIN CHANNEL 15F RND FF W/TCR (WOUND CARE) IMPLANT
DRAPE WARM FLUID 44X44 (DRAPE) ×2 IMPLANT
DRAPE X-RAY CASS 24X20 (DRAPES) IMPLANT
DRSG COVADERM 4X10 (GAUZE/BANDAGES/DRESSINGS) IMPLANT
DRSG COVADERM 4X8 (GAUZE/BANDAGES/DRESSINGS) IMPLANT
ELECT REM PT RETURN 9FT ADLT (ELECTROSURGICAL) ×2
ELECTRODE REM PT RTRN 9FT ADLT (ELECTROSURGICAL) ×1 IMPLANT
EVACUATOR SILICONE 100CC (DRAIN) IMPLANT
GLOVE BIO SURGEON STRL SZ 6.5 (GLOVE) ×1 IMPLANT
GLOVE BIOGEL PI IND STRL 6.5 (GLOVE) IMPLANT
GLOVE BIOGEL PI IND STRL 7.5 (GLOVE) ×1 IMPLANT
GLOVE BIOGEL PI INDICATOR 6.5 (GLOVE) ×5
GLOVE BIOGEL PI INDICATOR 7.5 (GLOVE) ×1
GLOVE SS BIOGEL STRL SZ 6.5 (GLOVE) IMPLANT
GLOVE SUPERSENSE BIOGEL SZ 6.5 (GLOVE) ×1
GLOVE SURG SS PI 7.5 STRL IVOR (GLOVE) ×2 IMPLANT
GOWN PREVENTION PLUS XXLARGE (GOWN DISPOSABLE) ×2 IMPLANT
GOWN STRL NON-REIN LRG LVL3 (GOWN DISPOSABLE) ×4 IMPLANT
GRAFT PROPATEN THIN WALL 6X40 (Vascular Products) ×1 IMPLANT
HEMOSTAT SNOW SURGICEL 2X4 (HEMOSTASIS) IMPLANT
HEMOSTAT SURGICEL 2X14 (HEMOSTASIS) IMPLANT
KIT BASIN OR (CUSTOM PROCEDURE TRAY) ×2 IMPLANT
KIT ROOM TURNOVER OR (KITS) ×2 IMPLANT
NS IRRIG 1000ML POUR BTL (IV SOLUTION) ×4 IMPLANT
PACK PERIPHERAL VASCULAR (CUSTOM PROCEDURE TRAY) ×2 IMPLANT
PAD ARMBOARD 7.5X6 YLW CONV (MISCELLANEOUS) ×2 IMPLANT
PADDING CAST COTTON 6X4 STRL (CAST SUPPLIES) IMPLANT
SET COLLECT BLD 21X3/4 12 (NEEDLE) IMPLANT
STAPLER VISISTAT 35W (STAPLE) IMPLANT
STOPCOCK 4 WAY LG BORE MALE ST (IV SETS) IMPLANT
SUT ETHILON 3 0 PS 1 (SUTURE) IMPLANT
SUT GORETEX 6.0 TT9 (SUTURE) ×3 IMPLANT
SUT PROLENE 5 0 C 1 24 (SUTURE) IMPLANT
SUT PROLENE 6 0 BV (SUTURE) ×2 IMPLANT
SUT PROLENE 7 0 BV 1 (SUTURE) IMPLANT
SUT SILK 3 0 (SUTURE)
SUT SILK 3-0 18XBRD TIE 12 (SUTURE) IMPLANT
SUT VIC AB 2-0 CT1 27 (SUTURE) ×2
SUT VIC AB 2-0 CT1 TAPERPNT 27 (SUTURE) ×1 IMPLANT
SUT VIC AB 3-0 SH 27 (SUTURE) ×2
SUT VIC AB 3-0 SH 27X BRD (SUTURE) ×1 IMPLANT
SUT VICRYL 4-0 PS2 18IN ABS (SUTURE) IMPLANT
SYR 3ML LL SCALE MARK (SYRINGE) ×2 IMPLANT
TOWEL OR 17X24 6PK STRL BLUE (TOWEL DISPOSABLE) ×4 IMPLANT
TOWEL OR 17X26 10 PK STRL BLUE (TOWEL DISPOSABLE) ×4 IMPLANT
TRAY FOLEY CATH 14FRSI W/METER (CATHETERS) ×2 IMPLANT
TUBING EXTENTION W/L.L. (IV SETS) IMPLANT
UNDERPAD 30X30 INCONTINENT (UNDERPADS AND DIAPERS) ×2 IMPLANT
WATER STERILE IRR 1000ML POUR (IV SOLUTION) ×2 IMPLANT

## 2011-09-12 NOTE — Progress Notes (Signed)
*  PRELIMINARY RESULTS*  ABIs has been performed. Preliminary - Right ABI is 0.71 and Left ABI is 1.13  [name] [name] 09/12/2011, 9:10 AM

## 2011-09-12 NOTE — Anesthesia Preprocedure Evaluation (Signed)
Anesthesia Evaluation  Patient identified by MRN, date of birth, ID band Patient awake    Reviewed: Allergy & Precautions, H&P , NPO status , Patient's Chart, lab work & pertinent test results  Airway Mallampati: II      Dental   Pulmonary          Cardiovascular hypertension,     Neuro/Psych    GI/Hepatic GERD-  ,  Endo/Other  Hypothyroidism   Renal/GU      Musculoskeletal   Abdominal   Peds  Hematology   Anesthesia Other Findings   Reproductive/Obstetrics                           Anesthesia Physical Anesthesia Plan  ASA: II  Anesthesia Plan: General   Post-op Pain Management:    Induction: Intravenous  Airway Management Planned: LMA  Additional Equipment:   Intra-op Plan:   Post-operative Plan:   Informed Consent: I have reviewed the patients History and Physical, chart, labs and discussed the procedure including the risks, benefits and alternatives for the proposed anesthesia with the patient or authorized representative who has indicated his/her understanding and acceptance.     Plan Discussed with: CRNA and Surgeon  Anesthesia Plan Comments:         Anesthesia Quick Evaluation

## 2011-09-12 NOTE — Plan of Care (Signed)
Problem: Phase I Progression Outcomes Goal: Tolerates diet without nausea/vomiting Outcome: Not Applicable Date Met:  09/12/11 Patient NPO due to returning to OR for revision.

## 2011-09-12 NOTE — Progress Notes (Signed)
Pt transported to OR via stretcher for revision of rt femoral popliteal due to thrombus, daugther will stay in room. [name] [name]

## 2011-09-12 NOTE — Anesthesia Procedure Notes (Signed)
Procedures

## 2011-09-12 NOTE — Brief Op Note (Signed)
09/11/2011 - 09/12/2011  11:27 PM  PATIENT:  Luisa Hart  65 y.o. female  PRE-OPERATIVE DIAGNOSIS:  occluded right fem-pop  POST-OPERATIVE DIAGNOSIS:  occluded right fem-pop  PROCEDURE:  Procedure(s): EMBOLECTOMY  SURGEON:  Surgeon(s): Nada Libman, MD  PHYSICIAN ASSISTANT:   ASSISTANTS: Gevena Barre   ANESTHESIA:   general  EBL:  Total I/O In: 3600 [I.V.:3600] Out: 325 [Urine:125; Blood:200]  BLOOD ADMINISTERED:none  DRAINS: none   LOCAL MEDICATIONS USED:  NONE  SPECIMEN:  No Specimen  DISPOSITION OF SPECIMEN:  N/A  COUNTS:  YES  TOURNIQUET:   Total Tourniquet Time Documented: Thigh (Right) - 14 minutes  DICTATION: .Note written in EPIC  PLAN OF CARE: Admit to inpatient   PATIENT DISPOSITION:  PACU - hemodynamically stable.   Delay start of Pharmacological VTE agent (>24hrs) due to surgical blood loss or risk of bleeding:  {YES/NO/NOT APPLICABLE:20182

## 2011-09-12 NOTE — Progress Notes (Addendum)
[facility] Progress Note  09/12/2011 7:13 AM POD 1  Feeling a bit nauseated this am.  She feels it is due to the pain medication.  Filed Vitals:   09/12/11 0400  BP: 109/64  Pulse: 58  Temp: 97.6 F (36.4 C)  Resp: 20    Incisions:  C/d/i...mild bloody drainage on bandage. Extremities:  RLE warm.  +DP and +PT signal with doppler.  CBC    Component Value Date/Time   WBC 7.6 09/12/2011 0405   RBC 3.78* 09/12/2011 0405   HGB 11.5* 09/12/2011 0405   HCT 34.2* 09/12/2011 0405   PLT 149* 09/12/2011 0405   MCV 90.5 09/12/2011 0405   MCH 30.4 09/12/2011 0405   MCHC 33.6 09/12/2011 0405   RDW 12.9 09/12/2011 0405    BMET    Component Value Date/Time   NA 137 09/12/2011 0405   K 4.2 09/12/2011 0405   CL 102 09/12/2011 0405   CO2 26 09/12/2011 0405   GLUCOSE 129* 09/12/2011 0405   BUN 8 09/12/2011 0405   CREATININE 0.77 09/12/2011 0405   CALCIUM 8.0* 09/12/2011 0405   GFRNONAA 86* 09/12/2011 0405   GFRAA >90 09/12/2011 0405    INR    Component Value Date/Time   INR 0.90 09/08/2011 0840     Intake/Output Summary (Last 24 hours) at 09/12/11 0713 Last data filed at 09/12/11 0600  Gross per 24 hour  Intake   3700 ml  Output   1285 ml  Net   2415 ml     Assessment/Plan:  65 y.o. female is s/p Thrombectomy of right  BYPASS GRAFT FEMORAL-POPLITEAL ARTERY  POD 1  Ambulate, continue OOB, transfer to telemetry floor.   Newton Pigg, PA-C [facility] [phone] 09/12/2011 7:13 AM   Pt comfortable.  Left foot warm but no palp dp  Pulse that was present post op..  Still with doppler flow.  Suspect graft occlusion. Will keep npo and check vascular lab.

## 2011-09-12 NOTE — Transfer of Care (Signed)
Immediate Anesthesia Transfer of Care Note  Patient: [name] [name]  Procedure(s) Performed:  EMBOLECTOMY - Embolectomy Right Femoral -Popliteal Bypass Graft, Revision of Distal Anastomosis  Patient Location: PACU  Anesthesia Type: General  Level of Consciousness: oriented, sedated and patient cooperative  Airway & Oxygen Therapy: Patient Spontanous Breathing and Patient connected to nasal cannula oxygen  Post-op Assessment: Report given to PACU RN, Post -op Vital signs reviewed and stable and Patient moving all extremities X 4  Post vital signs: Reviewed and stable  Complications: No apparent anesthesia complications

## 2011-09-12 NOTE — Anesthesia Postprocedure Evaluation (Signed)
  Anesthesia Post-op Note  Patient: [name] [name]  Procedure(s) Performed:  BYPASS GRAFT FEMORAL-POPLITEAL ARTERY - Thrombectomy right femoral popliteal bypass with intraoperative arteriogram  Patient Location: PACU and ICU  Anesthesia Type: General  Level of Consciousness: awake, alert  and oriented  Airway and Oxygen Therapy: Patient Spontanous Breathing  Post-op Pain: none  Post-op Assessment: Post-op Vital signs reviewed and Patient's Cardiovascular Status Stable  Post-op Vital Signs: Reviewed and stable  Complications: No apparent anesthesia complications

## 2011-09-12 NOTE — Anesthesia Postprocedure Evaluation (Signed)
Anesthesia Post Note  Patient: [name] [name]  Procedure(s) Performed:  EMBOLECTOMY - Embolectomy Right Femoral -Popliteal Bypass Graft, Revision of Distal Anastomosis  Anesthesia type: General  Patient location: PACU  Post pain: Pain level controlled and Adequate analgesia  Post assessment: Post-op Vital signs reviewed, Patient's Cardiovascular Status Stable, Respiratory Function Stable, Patent Airway and Pain level controlled  Last Vitals:  Filed Vitals:   09/12/11 2245  BP:   Pulse:   Temp: 36.5 C  Resp: 15    Post vital signs: Reviewed and stable  Level of consciousness: awake, alert  and oriented  Complications: No apparent anesthesia complications

## 2011-09-12 NOTE — Op Note (Signed)
[facility] [place]  Patient name: [name] [name] MRN: [id number] DOB: December 30, 1945 Sex: female  09/11/2011 - 09/12/2011 Pre-operative Diagnosis: Occluded Right Fem-Pop Bypass Post-operative diagnosis:  Same Surgeon:  Jorge Ny Assistants:  S.Ellington Procedure:   Thrombectomy Right Fem-Pop bypass, Revision of distal anastamosis Anesthesia:  General Blood Loss:  See anesthesia record Specimens:  none  Findings:  Neointima and plaque at distal anastamosis requiring revision.  I transected the distal graft and opened the distal anastamosis and placed a new 6mm Propatent PTFG interposition graft  Indications:  The patient underwent thrombectomy yesterday by Dr.Early and her graft occluded overnight.  She did not have an ischemic leg.  Intra-op angiogram showed possible disease at the distal anastamosis.  She comes back for thrombectomy.  I discussed the risks and benefits with the patient and daughter.  Procedure:  The patient was identified in the holding area and taken to [facility] OR ROOM 09  The patient was then placed supine on the table. general anesthesia was administered.  The patient was prepped and draped in the usual sterile fashion.  A time out was called and antibiotics were administered.  The above knee incision was opened with a #10 blade.  Bovie cautery was used to dissect through the subcutaneous tissue.  The fascia was divided with cautery.  Sharp dissection was then used to expose the gortex graft.  It was encircled with a blue vessel loop.  I then dissected down to the popliteal artery and exposed the artery 2cm distal to the anastamosis.  The patient was heparanized.   I initially made a transverse graftotomy just proximal to the distal anastamosis.  The graft was occluded.  A #4 fogarty was used to remove the thrombus in the distal artery.  Good back bleeding was encountered.  There was neo-intima in the anastamosis which I tried to remove with a right  angle.  I was able to pass a # 4 dilator, however I Felt that there was disease in the native artery that I could not pull out.  Therefore I elected to open teh distal anastamosis.  This was done with potts scissors.  To minimize back bleeding, a tournaquit was temporarily placed until I could occlude the distal artery with a baby gregory clamp.  The tournaquit was then let down. All residual disease was then removed.  I then took a new 6mm Propatent PTFE graft and re-did the distal anastamosis in a spatulated form.  This was done with CV6 gore suture.  I then performed thrombectomy of the proximal graft with a #4 and #5 Fogarty catheter.   Excellent bleeding was obtained.  The graft was then flushed with heparainzed saline and occluded.  An end to end anastamosis was constructed with CV6 suture.  Prior to completion, the appropriate flush maneuvers were performed.and the anastamosis was completed.  The patient had a palpable DP pulse.  50 mg of protamine was given.  Once hemostasis was obtained, the fascia was closed with 2-0 vicryl, the subcutaneous tissue was closed with 3-0 vicryl and the skin was closed with 4-0 vicryl.  Dermabond was placed.  The patient was successfully extubated and taken to the recovery room in stable condition.   Disposition:  To PACU in stable condition.   Jorge Ny [facility] [place] Office: [phone] Pager:  [phone]

## 2011-09-13 LAB — APTT: aPTT: 29 seconds (ref 24–37)

## 2011-09-13 LAB — BASIC METABOLIC PANEL
BUN: 4 mg/dL — ABNORMAL LOW (ref 6–23)
Calcium: 7.8 mg/dL — ABNORMAL LOW (ref 8.4–10.5)
Chloride: 107 mEq/L (ref 96–112)
Creatinine, Ser: 0.7 mg/dL (ref 0.50–1.10)
GFR calc Af Amer: >90 mL/min (ref >90)

## 2011-09-13 LAB — CBC
HCT: 32.6 % — ABNORMAL LOW (ref 36.0–46.0)
MCH: 30.6 pg (ref 26.0–34.0)
MCHC: 33.1 g/dL (ref 30.0–36.0)
MCV: 92.4 fL (ref 78.0–100.0)
Platelets: 124 10*3/uL — ABNORMAL LOW (ref 150–400)
RDW: 13 % (ref 11.5–15.5)
WBC: 11.6 10*3/uL — ABNORMAL HIGH (ref 4.0–10.5)

## 2011-09-13 MED ORDER — MAGNESIUM HYDROXIDE 400 MG/5ML PO SUSP: 30.0000 mL | ORAL | Status: DC | PRN

## 2011-09-13 MED ORDER — DOPAMINE-DEXTROSE 3.2-5 MG/ML-% IV SOLN
3.0000 ug/kg/min | INTRAVENOUS | Status: DC
  Filled 2011-09-13: qty 250

## 2011-09-13 MED ORDER — GUAIFENESIN-DM 100-10 MG/5ML PO SYRP: 15.0000 mL | ORAL_SOLUTION | ORAL | Status: DC | PRN

## 2011-09-13 MED ORDER — SODIUM CHLORIDE 0.9 % IV SOLN: 500.0000 mL | Freq: Once | INTRAVENOUS | Status: AC | PRN

## 2011-09-13 MED ORDER — ACETAMINOPHEN 325 MG PO TABS: 325.0000 mg | ORAL_TABLET | ORAL | Status: DC | PRN

## 2011-09-13 MED ORDER — LABETALOL HCL 5 MG/ML IV SOLN
10.0000 mg | INTRAVENOUS | Status: DC | PRN
  Filled 2011-09-13: qty 4

## 2011-09-13 MED ORDER — ALUM & MAG HYDROXIDE-SIMETH 400-400-40 MG/5ML PO SUSP
15.0000 mL | ORAL | Status: DC | PRN
  Filled 2011-09-13: qty 30

## 2011-09-13 MED ORDER — DEXTROSE 5 % IV SOLN
1.5000 g | Freq: Two times a day (BID) | INTRAVENOUS | Status: AC
  Administered 2011-09-13 (×2): 1.5 g via INTRAVENOUS
  Filled 2011-09-13 (×3): qty 1.5

## 2011-09-13 MED ORDER — FAMOTIDINE IN NACL 20-0.9 MG/50ML-% IV SOLN
20.0000 mg | Freq: Two times a day (BID) | INTRAVENOUS | Status: DC
  Administered 2011-09-13 (×2): 20 mg via INTRAVENOUS
  Filled 2011-09-13 (×5): qty 50

## 2011-09-13 MED ORDER — HYDRALAZINE HCL 20 MG/ML IJ SOLN
10.0000 mg | INTRAMUSCULAR | Status: DC | PRN
  Filled 2011-09-13: qty 0.5

## 2011-09-13 MED ORDER — BISACODYL 10 MG RE SUPP: 10.0000 mg | Freq: Every day | RECTAL | Status: DC | PRN

## 2011-09-13 MED ORDER — ONDANSETRON HCL 4 MG/2ML IJ SOLN: 4.0000 mg | Freq: Four times a day (QID) | INTRAMUSCULAR | Status: DC | PRN

## 2011-09-13 MED ORDER — OXYCODONE HCL 5 MG PO TABS
5.0000 mg | ORAL_TABLET | ORAL | Status: DC | PRN
  Administered 2011-09-13 – 2011-09-14 (×3): 10 mg via ORAL
  Filled 2011-09-13 (×3): qty 2

## 2011-09-13 MED ORDER — DOCUSATE SODIUM 100 MG PO CAPS
100.0000 mg | ORAL_CAPSULE | Freq: Every day | ORAL | Status: DC
  Administered 2011-09-14: 100 mg via ORAL
  Filled 2011-09-13 (×2): qty 1

## 2011-09-13 MED ORDER — POLYETHYLENE GLYCOL 3350 17 G PO PACK
17.0000 g | PACK | Freq: Every day | ORAL | Status: DC | PRN
  Filled 2011-09-13: qty 1

## 2011-09-13 MED ORDER — HEPARIN (PORCINE) IN NACL 100-0.45 UNIT/ML-% IJ SOLN
400.0000 [IU]/h | INTRAMUSCULAR | Status: DC
  Administered 2011-09-13: 400 [IU]/h via INTRAVENOUS
  Filled 2011-09-13 (×2): qty 250

## 2011-09-13 MED ORDER — MAGNESIUM SULFATE 40 MG/ML IJ SOLN
2.0000 g | Freq: Once | INTRAMUSCULAR | Status: AC | PRN
  Filled 2011-09-13: qty 50

## 2011-09-13 MED ORDER — POTASSIUM CHLORIDE CRYS ER 20 MEQ PO TBCR: 20.0000 meq | EXTENDED_RELEASE_TABLET | Freq: Once | ORAL | Status: AC | PRN

## 2011-09-13 MED ORDER — ZOLPIDEM TARTRATE 5 MG PO TABS: 5.0000 mg | ORAL_TABLET | Freq: Every evening | ORAL | Status: DC | PRN

## 2011-09-13 MED ORDER — ACETAMINOPHEN 650 MG RE SUPP: 325.0000 mg | RECTAL | Status: DC | PRN

## 2011-09-13 MED ORDER — KCL IN DEXTROSE-NACL 20-5-0.45 MEQ/L-%-% IV SOLN
INTRAVENOUS | Status: DC
  Administered 2011-09-13: 16:00:00 via INTRAVENOUS
  Filled 2011-09-13 (×4): qty 1000

## 2011-09-13 NOTE — Progress Notes (Signed)
Patient being transferred to 2034 via wheelchair. Vital signs stable. Room air 96%. No c/o pain or distress at this time. Family with patient. Report called to stephanie, rn.

## 2011-09-13 NOTE — Progress Notes (Signed)
[facility] [place]  Subjective  - POD #1   The patient was taken back to the operating room yesterday for an occluded right femoral to above-knee popliteal artery bypass graft. I performed thrombectomy of the bypass graft as well as revision of the distal anastomosis. The patient is doing okay this morning she does complain of some pain in her right foot.  Physical exam Both incisions look okay. She has palpable right dorsalis pedis pulse.  Assessment/Planning: POD #1  The patient is doing very well this morning. We will advance her diet and began to get her mobilized. I will discuss with Dr. early the roll of additional antiplatelet or anticoagulation medications for this patient.  Rowyn Mustapha IV, VAnner Crete 09/13/2011 7:38 AM   Objective Patient Vitals for the past 24 hrs:  BP Temp Temp src Pulse Resp SpO2  09/13/11 0352 108/48 mmHg 98.5 F (36.9 C) Tympanic 79  13  99 %  09/12/11 2307 - 97.5 F (36.4 C) - 66  13  100 %  09/12/11 2300 156/59 mmHg - - 83  19  98 %  09/12/11 2245 159/57 mmHg 97.7 F (36.5 C) - 90  15  96 %  09/12/11 1619 124/50 mmHg 98.3 F (36.8 C) Oral 80  17  96 %  09/12/11 1500 - - - 62  12  97 %  09/12/11 1208 124/44 mmHg 97.9 F (36.6 C) Oral 69  14  99 %  09/12/11 0813 110/58 mmHg 98.3 F (36.8 C) Oral 70  17  95 %    Intake/Output Summary (Last 24 hours) at 09/13/11 0738 Last data filed at 09/13/11 0600  Gross per 24 hour  Intake 5333.34 ml  Output    502 ml  Net 4831.34 ml   --  Laboratory CBC    Component Value Date/Time   WBC 11.6* 09/13/2011 0056   HGB 10.8* 09/13/2011 0056   HCT 32.6* 09/13/2011 0056   PLT 124* 09/13/2011 0056    BMET    Component Value Date/Time   NA 138 09/13/2011 0615   K 4.0 09/13/2011 0615   CL 107 09/13/2011 0615   CO2 26 09/13/2011 0615   GLUCOSE 115* 09/13/2011 0615   BUN 4* 09/13/2011 0615   CREATININE 0.70 09/13/2011 0615   CALCIUM 7.8* 09/13/2011 0615   GFRNONAA 89*  09/13/2011 0615   GFRAA >90 09/13/2011 0615    COAG Lab Results  Component Value Date   INR 0.90 09/08/2011   INR 1.3 03/02/2009   INR 0.9 03/01/2009   No results found for this basename: PTT    Antibiotics Anti-infectives     Start     Dose/Rate Route Frequency Ordered Stop   09/13/11 0030   cefUROXime (ZINACEF) 1.5 g in dextrose 5 % 50 mL IVPB        1.5 g 100 mL/hr over 30 Minutes Intravenous Every 12 hours 09/13/11 0032 09/13/11 2159   09/11/11 2200   cefUROXime (ZINACEF) 1.5 g in dextrose 5 % 50 mL IVPB        1.5 g 100 mL/hr over 30 Minutes Intravenous Every 12 hours 09/11/11 1830 09/12/11 1126   09/11/11 0945   cefUROXime (ZINACEF) 1.5 g in dextrose 5 % 50 mL IVPB  Status:  Discontinued        1.5 g 100 mL/hr over 30 Minutes Intravenous  Once 09/11/11 0939 09/11/11 1816           V. Charlena Cross, M.D.  [facility] [place] Office: [phone] Pager:  [phone]

## 2011-09-13 NOTE — Progress Notes (Signed)
[facility] Progress Note  09/13/2011 7:31 AM POD 1/2  Pt is without complaints this am.    Filed Vitals:   09/13/11 0352  BP: 108/48  Pulse: 79  Temp: 98.5 F (36.9 C)  Resp: 13    Incisions:  C/d/i...some erythema surrounding incision; no drainage. Extremities: RLE Warm with palpable DP pulse on the right  CBC    Component Value Date/Time   WBC 11.6* 09/13/2011 0056   RBC 3.53* 09/13/2011 0056   HGB 10.8* 09/13/2011 0056   HCT 32.6* 09/13/2011 0056   PLT 124* 09/13/2011 0056   MCV 92.4 09/13/2011 0056   MCH 30.6 09/13/2011 0056   MCHC 33.1 09/13/2011 0056   RDW 13.0 09/13/2011 0056    BMET    Component Value Date/Time   NA 137 09/12/2011 0405   K 4.2 09/12/2011 0405   CL 102 09/12/2011 0405   CO2 26 09/12/2011 0405   GLUCOSE 129* 09/12/2011 0405   BUN 8 09/12/2011 0405   CREATININE 0.77 09/12/2011 0405   CALCIUM 8.0* 09/12/2011 0405   GFRNONAA 86* 09/12/2011 0405   GFRAA >90 09/12/2011 0405    INR    Component Value Date/Time   INR 0.90 09/08/2011 0840     Intake/Output Summary (Last 24 hours) at 09/13/11 0731 Last data filed at 09/13/11 0600  Gross per 24 hour  Intake 5333.34 ml  Output    502 ml  Net 4831.34 ml     Assessment/Plan:  65 y.o. female is s/p Thrombectomy Right Fem-Pop bypass, Revision of distal anastamosis POD 1/2  Pt doing well this am.  Palpable pulse in the right DP this am.  Surgical blood loss anemia stable. D/c foley.  OOB to chair. Continue heparin/will d/w Dr. Arbie Cookey when to discontinue. Transfer to 2000.   Newton Pigg, PA-C [facility] [phone] 09/13/2011 7:31 AM

## 2011-09-14 LAB — APTT: aPTT: 30 seconds (ref 24–37)

## 2011-09-14 MED ORDER — OXYCODONE HCL 5 MG PO TABS: 5.0000 mg | ORAL_TABLET | ORAL | Status: AC | PRN

## 2011-09-14 NOTE — Progress Notes (Signed)
OT evaluation completed, all education completed and no further therapy needs noted. Recommend D/C home with family assist and educated pt. On using her RW at home to increase safety. Will sign off acutely. Thanks! Cassandria Anger, OTR/L Pager: [phone] 09/14/2011 .

## 2011-09-14 NOTE — Progress Notes (Signed)
Utilization review completed. Simren Popson, RN, BSN. 09/14/11  

## 2011-09-14 NOTE — Progress Notes (Signed)
Subjective: Interval History: has complaints mild incisional soreness  Objective: Vital signs in last 24 hours: Temp:  [97 F (36.1 C)-98.4 F (36.9 C)] 98.4 F (36.9 C) (11/15 0419) Pulse Rate:  [67-88] 88  (11/15 0419) Resp:  [12-18] 18  (11/15 0419) BP: (97-133)/(49-64) 106/58 mmHg (11/15 0419) SpO2:  [93 %-96 %] 94 % (11/15 0419)  Intake/Output from previous day: 11/14 0701 - 11/15 0700 In: 1468 [P.O.:960; I.V.:458; IV Piggyback:50] Out: 1425 [Urine:1425] Intake/Output this shift:    Extremities: Groin and popliteal wds healing,  2+dp pulse  Lab Results:  [facility] 09/13/11 0056 09/12/11 0405  WBC 11.6* 7.6  HGB 10.8* 11.5*  HCT 32.6* 34.2*  PLT 124* 149*   BMET  Basename 09/13/11 0615 09/12/11 0405  NA 138 137  K 4.0 4.2  CL 107 102  CO2 26 26  GLUCOSE 115* 129*  BUN 4* 8  CREATININE 0.70 0.77  CALCIUM 7.8* 8.0*    Studies/Results: Dg Chest 2 View  09/08/2011  *RADIOLOGY REPORT*  Clinical Data: Preop, peripheral vascular disease  CHEST - 2 VIEW  Comparison: Chest radiograph 03/03/2009  Findings: Stable enlarged heart silhouette.  There is chronic bronchitic markings similar to prior.  No effusion, infiltrate, or pneumothorax.  IMPRESSION:  1.  No acute cardiopulmonary process. 2.  Cardiomegaly and chronic bronchitic markings.  Original Report Authenticated By: Genevive Bi, M.D.   Dg Ang/ext/uni/or Right  09/11/2011  *RADIOLOGY REPORT*  Clinical Data: Post embolectomy  RIGHT EXTREMITY ARTERIOGRAPHY  Comparison: None.  Findings: There is a graft to the popliteal artery above the knee. There is mild short segment narrowing near the distal anastomosis. There is contiguous three-vessel runoff seen to the below the calf, lower margin of the film.  IMPRESSION:  1.  Patent graft to the proximal popliteal artery with three-vessel runoff at least as far as the lower calf.  Original Report Authenticated By: Osa Craver, M.D.    Anti-infectives: Anti-infectives     Start     Dose/Rate Route Frequency Ordered Stop   09/13/11 0030   cefUROXime (ZINACEF) 1.5 g in dextrose 5 % 50 mL IVPB        1.5 g 100 mL/hr over 30 Minutes Intravenous Every 12 hours 09/13/11 0032 09/13/11 1154   09/11/11 2200   cefUROXime (ZINACEF) 1.5 g in dextrose 5 % 50 mL IVPB        1.5 g 100 mL/hr over 30 Minutes Intravenous Every 12 hours 09/11/11 1830 09/12/11 1126   09/11/11 0945   cefUROXime (ZINACEF) 1.5 g in dextrose 5 % 50 mL IVPB  Status:  Discontinued        1.5 g 100 mL/hr over 30 Minutes Intravenous  Once 09/11/11 0939 09/11/11 1816          Assessment/Plan: s/p Procedure(s): EMBOLECTOMY D/c home today   LOS: 3 days   [name] [name] F 09/14/2011, 10:09 AM

## 2011-09-14 NOTE — Progress Notes (Signed)
Occupational Therapy Evaluation Patient Details Name: [name] [name] MRN: [id number] DOB: 06-Oct-1946 Today's Date: 09/14/2011  Problem List:  Patient Active Problem List  Diagnoses  . PAD (peripheral artery disease)    Past Medical History:  Past Medical History  Diagnosis Date  . Peripheral arterial disease   . Hyperlipidemia   . GERD (gastroesophageal reflux disease)   . Thyroid disease     Hypothyroidism  . Hypothyroidism   . Blood transfusion     2 /12 yrs ago  . Hypertension     stress test about 10 yrs ago-turned out it was thyroid disease   Past Surgical History:  Past Surgical History  Procedure Date  . Femoral endarterectomy 04-07-08    bilateral common femoral arteries  . Pr vein bypass graft,aorto-fem-pop 03/02/09    Aortobifemoral BPG, Bilateral fem- popliteal BPG  . Hand surgery     cyst removed left hand  . Appendectomy     45 yrs ago    OT Assessment/Plan/Recommendation OT Assessment Clinical Impression Statement: Pt. moving well and anticipates D/C today. No further OT needs noted. OT Recommendation/Assessment: Patient does not need any further OT services OT Goals  None.   OT Evaluation  Prior Functioning Home Living Lives With: Family Receives Help From: Family Type of Home: House Home Layout: One level Home Access: Stairs to enter [employer] of Steps: 1 Bathroom Shower/Tub: Psychologist, counselling;Door [employer] Toilet: Handicapped height Bathroom Accessibility: Yes How Accessible: Accessible via walker Home Adaptive Equipment: Walker - rolling;Raised toilet seat with rails;Shower chair with back Prior Function Level of Independence: Independent with basic ADLs;Independent with homemaking with ambulation;Independent with gait;Independent with transfers Able to Take Stairs?: Yes Driving: Yes ADL ADL Eating/Feeding: Independent;Performed Where Assessed - Eating/Feeding: Chair Grooming: Performed;Wash/dry  hands;Supervision/safety;Wash/dry face Where Assessed - Grooming: Standing at sink Upper Body Bathing: Not assessed Lower Body Bathing: Not assessed Upper Body Dressing: Performed;Supervision/safety Where Assessed - Upper Body Dressing: Sitting, bed Lower Body Dressing: Performed;Minimal assistance Lower Body Dressing Details (indicate cue type and reason): With donning Rt. sock due to pt. unable to reach foot Where Assessed - Lower Body Dressing: Sit to stand from bed Toilet Transfer: Performed;Supervision/safety Toilet Transfer Details (indicate cue type and reason): With use of RW and min verbal cues for safety and use of RW Toilet Transfer Method: Proofreader: Grab bars Toileting - Clothing Manipulation: Performed;Set up Toileting - Clothing Manipulation Details (indicate cue type and reason): With gown use Where Assessed - Toileting Clothing Manipulation: Sit to stand from 3-in-1 or toilet Toileting - Hygiene: Performed;Set up Where Assessed - Toileting Hygiene: Sit to stand from 3-in-1 or toilet Tub/Shower Transfer: Not assessed Tub/Shower Transfer Method: Not assessed Equipment Used: Rolling walker ADL Comments: Pt. educated on safety at home and techniques for performing ADLs due to Rt. LE unable to bend to perform LB ADLs. Pt. reports she will have assist PRN from family.  Vision/Perception  Vision - History Baseline Vision: No visual deficits Patient Visual Report: No change from baseline Praxis Praxis: Intact Cognition Cognition Arousal/Alertness: Awake/alert Overall Cognitive Status: Appears within functional limits for tasks assessed Orientation Level: Oriented X4 Sensation/Coordination Sensation Light Touch: Appears Intact Stereognosis: Appears Intact Hot/Cold: Appears Intact Proprioception: Appears Intact Coordination Gross Motor Movements are Fluid and Coordinated: Yes Fine Motor Movements are Fluid and Coordinated: Yes Extremity  Assessment RUE Assessment RUE Assessment: Within Functional Limits LUE Assessment LUE Assessment: Within Functional Limits Mobility  Bed Mobility Bed Mobility: Yes Supine to Sit: 6: Modified independent (Device/Increase  time) Transfers Transfers: Yes Sit to Stand: 5: Supervision;With upper extremity assist;From bed;From chair/3-in-1 (and use of RW) Sit to Stand Details (indicate cue type and reason): Min verbal cues for hand placement for safety Stand to Sit: 5: Supervision;With upper extremity assist;To chair/3-in-1 Pt. Ambulated ~100 with RW and supervision.     End of Session OT - End of Session Activity Tolerance: Patient tolerated treatment well Patient left: in chair;with call bell in reach Nurse Communication: Mobility status for transfers;Mobility status for ambulation General Behavior During Session: [facility] for tasks performed Cognition: [facility] for tasks performed   Katalea Ucci,OTR/L Pager:[phone] 09/14/2011, 10:13 AM

## 2011-09-14 NOTE — Discharge Summary (Signed)
[facility] Discharge Summary   Patient ID:  [name] [name] MRN: [id number] DOB/AGE: 1945-11-25 65 y.o.  Admit date: 09/11/2011 Discharge date: 09/14/2011 Date of Surgery: 09/11/2011 - 09/12/2011 Surgeon: Moishe Spice): Nada Libman, MD  Admission Diagnosis: Occluded BPG;PVD occluded right fem-pop  Discharge Diagnoses:  Occluded BPG;PVD occluded right fem-pop  Secondary Diagnoses: Past Medical History  Diagnosis Date  . Peripheral arterial disease   . Hyperlipidemia   . GERD (gastroesophageal reflux disease)   . Thyroid disease     Hypothyroidism  . Hypothyroidism   . Blood transfusion     2 /12 yrs ago  . Hypertension     stress test about 10 yrs ago-turned out it was thyroid disease    Procedures: Procedure(s): EMBOLECTOMY  Discharged Condition: good  HPI:  History of Present Illness: Patient is a 64 y.o. year old female who presents for evaluation of claudication. The patient currently describes a cramping sensation in the right lower extremity. There is not a component of rest pain. There is no history of ulcerations on the feet. The patient had been doing well until about 5-7 days ago when after taking a 2 mile walk she noticed that she can only walk about one to 2 blocks reports been experiencing claudication in the right leg. Prior to this she could walk unlimited distances. She has a complicated previous vascular history with bilateral femoral endarterectomies by Dr. Arbie Cookey in June of 2009 followed by a left external iliac to left profunda bypass in November of 2009. She subsequently had an aortobifemoral bypass with bilateral femoral to above-knee popliteal bypasses in May of 2010. Atherosclerotic risk factors and other medical problems include hyperlipidemia, hypertension. She quit smoking in 2002. Pt admitted for thrombectomy and revision of right fem-pop.   Hospital Course:  MEHA [name] is a 65 y.o. female is S/P Right EMBOLECTOMY and  revision of right fem -pop bypass Extubated: POD # 0 Post-op wounds healing well Pt. Ambulating, voiding and taking PO diet without difficulty. Pt pain controlled with PO pain meds. Labs as below Complications:POD#1 pt agin lost signal in right fem-pop bypass and was taken back to the OR on 11/14 by Dr. Edilia Bo for thrombectomy and redo distal interposition graft with gortex from old fem -pop bypass more distally to the above knee pop artery      Significant Diagnostic Studies: CBC    Component Value Date/Time   WBC 11.6* 09/13/2011 0056   RBC 3.53* 09/13/2011 0056   HGB 10.8* 09/13/2011 0056   HCT 32.6* 09/13/2011 0056   PLT 124* 09/13/2011 0056   MCV 92.4 09/13/2011 0056   MCH 30.6 09/13/2011 0056   MCHC 33.1 09/13/2011 0056   RDW 13.0 09/13/2011 0056    BMET    Component Value Date/Time   NA 138 09/13/2011 0615   K 4.0 09/13/2011 0615   CL 107 09/13/2011 0615   CO2 26 09/13/2011 0615   GLUCOSE 115* 09/13/2011 0615   BUN 4* 09/13/2011 0615   CREATININE 0.70 09/13/2011 0615   CALCIUM 7.8* 09/13/2011 0615   GFRNONAA 89* 09/13/2011 0615   GFRAA >90 09/13/2011 0615    COAG Lab Results  Component Value Date   INR 0.90 09/08/2011   INR 1.3 03/02/2009   INR 0.9 03/01/2009     Disposition:  Discharge to :Home Discharge Orders    Future Appointments: Provider: Department: Dept Phone: Center:   10/10/2011 10:00 AM [facility] [phone] [facility]   10/10/2011 10:45 AM  Larina Earthly, MD [facility] [phone] [facility]     Future Orders Please Complete By Expires   Resume previous diet      Driving Restrictions      Comments:   No driving for 4 weeks   Lifting restrictions      Comments:   No lifting for 4 weeks   Call MD for:  temperature >100.5      Call MD for:  redness, tenderness, or signs of infection (pain, swelling, bleeding, redness, odor or green/yellow discharge around incision site)      Call MD for:  severe or increased pain, loss or  decreased feeling  in affected limb(s)      Increase activity slowly      Comments:   Walk with assistance use walker or cane as needed   May shower       No dressing needed      may wash over wound with mild soap and water      Discharge patient      Comments:   Discharge pt to home      Shariah, Assad  Home Medication Instructions JYN:[id number]   Printed on:09/14/11 1029  Medication Information                    levothyroxine (SYNTHROID, LEVOTHROID) 112 MCG tablet Take 112 mcg by mouth daily.             aspirin 81 MG tablet Take 81 mg by mouth daily.             simvastatin (ZOCOR) 40 MG tablet Take 40 mg by mouth at bedtime.             ALPRAZolam (XANAX) 0.25 MG tablet Take 0.25 mg by mouth at bedtime as needed. For anxiety            oxyCODONE (OXY IR/ROXICODONE) 5 MG immediate release tablet Take 1-2 tablets (5-10 mg total) by mouth every 4 (four) hours as needed.            Verbal and written Discharge instructions given to the patient. Wound care per Discharge AVS  Signed: Marlowe Shores [facility] 09/14/2011, 10:29 AM

## 2011-09-15 NOTE — Procedures (Unsigned)
BYPASS GRAFT EVALUATION  INDICATION:  Bilateral lower extremity bypass grafts.  HISTORY: Diabetes:  No. Cardiac:  No. Hypertension:  No. Smoking:  Previous. Previous Surgery:  Aortobifem and bilateral fem-pop bypass grafts in May 2010.  SINGLE LEVEL ARTERIAL EXAM                              RIGHT              LEFT Brachial: Anterior tibial: Posterior tibial: Peroneal: Ankle/brachial index:  PREVIOUS ABI:  Date:  RIGHT:  LEFT:  LOWER EXTREMITY BYPASS GRAFT DUPLEX EXAM:  DUPLEX: 1. No flow is noted in the right lower extremity bypass graft with     collateral circulation visualized. 2. Biphasic Doppler waveforms noted throughout the left lower     extremity bypass graft with no increase in velocities.  IMPRESSION: 1. The right lower extremity bypass graft appears totally occluded. 2. Patent left lower extremity bypass graft with no evidence of     stenosis. 3. Bilateral ankle brachial indices are noted on a separate report.  ___________________________________________ Janetta Hora. Fields, MD  CH/MEDQ  D:  09/08/2011  T:  09/08/2011  Job:  [id number]

## 2011-09-18 ENCOUNTER — Encounter: Payer: Self-pay | Admitting: Vascular Surgery

## 2011-09-19 ENCOUNTER — Encounter (HOSPITAL_COMMUNITY): Payer: Self-pay | Admitting: Surgery

## 2011-10-09 ENCOUNTER — Encounter: Payer: Self-pay | Admitting: Vascular Surgery

## 2011-10-10 ENCOUNTER — Encounter: Payer: Self-pay | Admitting: Vascular Surgery

## 2011-10-10 ENCOUNTER — Ambulatory Visit (INDEPENDENT_AMBULATORY_CARE_PROVIDER_SITE_OTHER): Payer: Medicare Other | Admitting: *Deleted

## 2011-10-10 ENCOUNTER — Ambulatory Visit (INDEPENDENT_AMBULATORY_CARE_PROVIDER_SITE_OTHER): Payer: Medicare Other | Admitting: Vascular Surgery

## 2011-10-10 VITALS — BP 149/54 | HR 86 | Resp 18 | Ht 61.0 in | Wt 123.0 lb

## 2011-10-10 DIAGNOSIS — Z48812 Encounter for surgical aftercare following surgery on the circulatory system: Secondary | ICD-10-CM

## 2011-10-10 DIAGNOSIS — I7092 Chronic total occlusion of artery of the extremities: Secondary | ICD-10-CM

## 2011-10-10 DIAGNOSIS — I739 Peripheral vascular disease, unspecified: Secondary | ICD-10-CM

## 2011-10-10 NOTE — Progress Notes (Signed)
The patient should stay for followup of her recent thrombectomy and revision of right femoral to popliteal bypass. She's doing well since her discharge and was returned to her usual activity. She is status post aortobifemoral bypass and bilateral femo above-knee popliteal bypasses with Gore-Tex in may of 2010 she's done well until she presented in mid November with acute occlusion of her right femoropopliteal bypass. At this point to been walking 2 miles a day without difficulty.  She underwent thrombectomy via the groin on November 12. Intraoperative arteriogram showed some minimal narrowing in her distal anastomosis with good runoff. She suffered a reocclusion of her graft at night and a segment February for thrombectomy revision of her distal anastomosis by Dr. Myra Gianotti on 09/12/2011. Her graft physical exam her right groin and popliteal incisions are healing quite nicely she has normal dorsalis pedis pulses bilaterally.  Vascular lab: Normal ankle arm index and normal triphasic waveforms bilaterally  Impression and plan: Stable status post revision of right femoropopliteal bypass. The patient will be seen again in 3 months with repeat ankle arm indices and graft scan in. She will notify should she have any difficulty

## 2012-01-09 ENCOUNTER — Ambulatory Visit (INDEPENDENT_AMBULATORY_CARE_PROVIDER_SITE_OTHER): Payer: Medicare Other | Admitting: Vascular Surgery

## 2012-01-09 ENCOUNTER — Encounter (INDEPENDENT_AMBULATORY_CARE_PROVIDER_SITE_OTHER): Payer: Medicare Other | Admitting: *Deleted

## 2012-01-09 ENCOUNTER — Ambulatory Visit (INDEPENDENT_AMBULATORY_CARE_PROVIDER_SITE_OTHER): Payer: Medicare Other | Admitting: *Deleted

## 2012-01-09 ENCOUNTER — Encounter: Payer: Self-pay | Admitting: Vascular Surgery

## 2012-01-09 VITALS — BP 152/50 | HR 85 | Resp 16 | Ht 61.0 in | Wt 128.0 lb

## 2012-01-09 DIAGNOSIS — Z48812 Encounter for surgical aftercare following surgery on the circulatory system: Secondary | ICD-10-CM

## 2012-01-09 DIAGNOSIS — I70509 Unspecified atherosclerosis of nonautologous biological bypass graft(s) of the extremities, unspecified extremity: Secondary | ICD-10-CM

## 2012-01-09 DIAGNOSIS — I70219 Atherosclerosis of native arteries of extremities with intermittent claudication, unspecified extremity: Secondary | ICD-10-CM

## 2012-01-09 DIAGNOSIS — I7092 Chronic total occlusion of artery of the extremities: Secondary | ICD-10-CM

## 2012-01-09 NOTE — Progress Notes (Signed)
The patient presents today with acute worsening claudication 3 days ago. Patient undergone bifemoral bypass and bilateral femoral to above-knee popliteal bypasses on the same setting for bilateral tissue loss in may of 2010. She did well following this procedure until November of 2012. At that time she had acute occlusion of her right femoral-popliteal bypass. She underwent tubectomy this on 09/11/2011 with intraoperative arteriogram revealing widely patent above-knee popliteal segment. There was mild narrowing at the distal anastomosis. GU reoccluded that evening and was taken back to underwent revision of the distal anastomosis. She had done well for the last 4 months. On 01/06/2012 he had recurrence of claudication. She has not had any rest pain.  Past Medical History  Diagnosis Date  . Peripheral arterial disease   . Hyperlipidemia   . GERD (gastroesophageal reflux disease)   . Thyroid disease     Hypothyroidism  . Hypothyroidism   . Blood transfusion     2 /12 yrs ago  . Hypertension     stress test about 10 yrs ago-turned out it was thyroid disease  . Arthritis     History  Substance Use Topics  . Smoking status: Former Smoker    Quit date: 09/06/2001  . Smokeless tobacco: Not on file  . Alcohol Use: 4.2 oz/week    7 Glasses of wine per week    History reviewed. No pertinent family history.  Allergies  Allergen Reactions  . Morphine And Related Nausea And Vomiting    Current outpatient prescriptions:ALPRAZolam (XANAX) 0.25 MG tablet, Take 0.25 mg by mouth at bedtime as needed. For anxiety , Disp: , Rfl: ;  aspirin 81 MG tablet, Take 81 mg by mouth daily.  , Disp: , Rfl: ;  levothyroxine (SYNTHROID, LEVOTHROID) 112 MCG tablet, Take 112 mcg by mouth daily.  , Disp: , Rfl: ;  simvastatin (ZOCOR) 40 MG tablet, Take 40 mg by mouth at bedtime.  , Disp: , Rfl:   BP 152/50  Pulse 85  Resp 16  Ht 5\' 1"  (1.549 m)  Wt 128 lb (58.06 kg)  BMI 24.19 kg/m2  SpO2 97%  Body mass index  is 24.19 kg/(m^2).       Review of systems, no change  Is exam well-developed well-nourished white female no acute distress. HEENT normal. Chest clear bilaterally. Heart regular rate and rhythm. 2+ radial pulses bilaterally. 2+ femoral pulses bilaterally. 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 The patient is not able to tolerate this level of claudication since she is quite active. I of the discussed the option of a thrombectomy of her Gore-Tex graft versus vein bypass. Since she has had recurrent occlusion of this Gore-Tex I would recommend vein femoropopliteal. I imaged her vein with SonoSite ultrasound and is patent throughout the thigh. It does become somewhat small at the knee. I recommended right vein femoropopliteal bypass and she understands and wished to proceed. We have scheduled this for 01/17/2012. She knows to notify should she develop any worsening claudication. She reports it has been completely stable since the initial event 3 days ago

## 2012-01-10 ENCOUNTER — Encounter (HOSPITAL_COMMUNITY): Payer: Self-pay | Admitting: Pharmacy Technician

## 2012-01-10 ENCOUNTER — Other Ambulatory Visit: Payer: Self-pay

## 2012-01-11 NOTE — Procedures (Unsigned)
BYPASS GRAFT EVALUATION  INDICATION:  Type and follow up right femoral to popliteal bypass graft.  HISTORY: Diabetes:  No. Cardiac:  No. Hypertension:  No. Smoking:  Previously. Previous Surgery:  SINGLE LEVEL ARTERIAL EXAM                              RIGHT              LEFT Brachial: Anterior tibial: Posterior tibial: Peroneal: Ankle/brachial index:  PREVIOUS ABI:  Date: 10/10/11  RIGHT:  1.08  LEFT:  1.24.  TODAY'S ABI:  Date: 01/09/12  RIGHT:  0.49.  LEFT:  1.22.  LOWER EXTREMITY BYPASS GRAFT DUPLEX EXAM:  DUPLEX:  No color flow or Doppler signal throughout the right femoral to popliteal bypass graft.  IMPRESSION:  Occluded right femoral popliteal bypass graft.  ___________________________________________ Larina Earthly, M.D.  SS/MEDQ  D:  01/09/2012  T:  01/09/2012  Job:  [id number]

## 2012-01-12 ENCOUNTER — Encounter (HOSPITAL_COMMUNITY): Payer: Self-pay

## 2012-01-12 ENCOUNTER — Encounter (HOSPITAL_COMMUNITY)
Admission: RE | Admit: 2012-01-12 | Discharge: 2012-01-12 | Disposition: A | Discharge status: Home / Self Care | Payer: Medicare Other | Source: Ambulatory Visit | Attending: Vascular Surgery | Admitting: Vascular Surgery

## 2012-01-12 LAB — DIFFERENTIAL
Eosinophils Relative: 3 % (ref 0–5)
Lymphs Abs: 1.7 10*3/uL (ref 0.7–4.0)
Monocytes Absolute: 0.6 10*3/uL (ref 0.1–1.0)
Monocytes Relative: 8 % (ref 3–12)
Neutro Abs: 4.7 10*3/uL (ref 1.7–7.7)
Neutrophils Relative %: 65 % (ref 43–77)

## 2012-01-12 LAB — TYPE AND SCREEN
ABO/RH(D): A POS
Antibody Screen: NEGATIVE

## 2012-01-12 LAB — URINALYSIS, ROUTINE W REFLEX MICROSCOPIC
Nitrite: NEGATIVE
Protein, ur: NEGATIVE mg/dL
Specific Gravity, Urine: 1.008 (ref 1.005–1.030)
Urobilinogen, UA: 1 mg/dL (ref 0.0–1.0)

## 2012-01-12 LAB — CBC
HCT: 45.1 % (ref 36.0–46.0)
Hemoglobin: 15.4 g/dL — ABNORMAL HIGH (ref 12.0–15.0)
MCHC: 34.1 g/dL (ref 30.0–36.0)
MCV: 86.9 fL (ref 78.0–100.0)
WBC: 7.2 10*3/uL (ref 4.0–10.5)

## 2012-01-12 LAB — COMPREHENSIVE METABOLIC PANEL
Alkaline Phosphatase: 91 U/L (ref 39–117)
BUN: 9 mg/dL (ref 6–23)
Chloride: 100 mEq/L (ref 96–112)
Creatinine, Ser: 0.68 mg/dL (ref 0.50–1.10)
GFR calc Af Amer: >90 mL/min (ref >90)
Glucose, Bld: 88 mg/dL (ref 70–99)
Potassium: 3.9 mEq/L (ref 3.5–5.1)
Total Bilirubin: 0.2 mg/dL — ABNORMAL LOW (ref 0.3–1.2)

## 2012-01-12 LAB — APTT: aPTT: 25 seconds (ref 24–37)

## 2012-01-12 NOTE — Pre-Procedure Instructions (Signed)
20 [name] [name]  01/12/2012   Your procedure is scheduled on: Wednesday  01/17/12   Report to [facility] at 630 AM.  Call this number if you have problems the morning of surgery: [phone]   Remember:   Do not eat food:After Midnight.  May have clear liquids: up to 4 Hours before arrival.  Clear liquids include soda, tea, black coffee, apple or grape juice, broth.  Take these medicines the morning of surgery with A SIP OF WATER:   XANAX, SYNTHROID    Do not wear jewelry, make-up or nail polish.  Do not wear lotions, powders, or perfumes. You may wear deodorant.  Do not shave 48 hours prior to surgery.  Do not bring valuables to the hospital.  Contacts, dentures or bridgework may not be worn into surgery.  Leave suitcase in the car. After surgery it may be brought to your room.  For patients admitted to the hospital, checkout time is 11:00 AM the day of discharge.   Patients discharged the day of surgery will not be allowed to drive home.  Name and phone number of your driver:   Special Instructions: CHG Shower Use Special Wash: 1/2 bottle night before surgery and 1/2 bottle morning of surgery.   Please read over the following fact sheets that you were given: Pain Booklet, MRSA Information and Surgical Site Infection Prevention

## 2012-01-16 MED ORDER — DEXTROSE 5 % IV SOLN
1.5000 g | Freq: Once | INTRAVENOUS | Status: AC
  Administered 2012-01-17: 1.5 g via INTRAVENOUS
  Filled 2012-01-16: qty 1.5

## 2012-01-17 ENCOUNTER — Encounter (HOSPITAL_COMMUNITY)
Admission: EM | Disposition: A | Discharge status: Home / Self Care | Payer: Self-pay | Source: Ambulatory Visit | Attending: Vascular Surgery

## 2012-01-17 ENCOUNTER — Ambulatory Visit (HOSPITAL_COMMUNITY): Payer: Medicare Other | Admitting: Anesthesiology

## 2012-01-17 ENCOUNTER — Encounter (HOSPITAL_COMMUNITY): Payer: Self-pay | Admitting: Anesthesiology

## 2012-01-17 ENCOUNTER — Encounter (HOSPITAL_COMMUNITY): Payer: Self-pay | Admitting: *Deleted

## 2012-01-17 ENCOUNTER — Ambulatory Visit (HOSPITAL_COMMUNITY): Payer: Medicare Other

## 2012-01-17 ENCOUNTER — Inpatient Hospital Stay (HOSPITAL_COMMUNITY)
Admission: EM | Admit: 2012-01-17 | Discharge: 2012-01-19 | DRG: 254 | Disposition: A | Discharge status: Home / Self Care | Payer: Medicare Other | Source: Ambulatory Visit | Attending: Vascular Surgery | Admitting: Vascular Surgery

## 2012-01-17 DIAGNOSIS — M129 Arthropathy, unspecified: Secondary | ICD-10-CM | POA: Diagnosis present

## 2012-01-17 DIAGNOSIS — Z87891 Personal history of nicotine dependence: Secondary | ICD-10-CM

## 2012-01-17 DIAGNOSIS — Z79899 Other long term (current) drug therapy: Secondary | ICD-10-CM

## 2012-01-17 DIAGNOSIS — T82898A Other specified complication of vascular prosthetic devices, implants and grafts, initial encounter: Principal | ICD-10-CM | POA: Diagnosis present

## 2012-01-17 DIAGNOSIS — E785 Hyperlipidemia, unspecified: Secondary | ICD-10-CM | POA: Diagnosis present

## 2012-01-17 DIAGNOSIS — I10 Essential (primary) hypertension: Secondary | ICD-10-CM | POA: Diagnosis present

## 2012-01-17 DIAGNOSIS — Z7982 Long term (current) use of aspirin: Secondary | ICD-10-CM

## 2012-01-17 DIAGNOSIS — I70219 Atherosclerosis of native arteries of extremities with intermittent claudication, unspecified extremity: Secondary | ICD-10-CM

## 2012-01-17 DIAGNOSIS — Z01812 Encounter for preprocedural laboratory examination: Secondary | ICD-10-CM

## 2012-01-17 DIAGNOSIS — I739 Peripheral vascular disease, unspecified: Secondary | ICD-10-CM

## 2012-01-17 DIAGNOSIS — E039 Hypothyroidism, unspecified: Secondary | ICD-10-CM | POA: Diagnosis present

## 2012-01-17 DIAGNOSIS — Y832 Surgical operation with anastomosis, bypass or graft as the cause of abnormal reaction of the patient, or of later complication, without mention of misadventure at the time of the procedure: Secondary | ICD-10-CM | POA: Diagnosis present

## 2012-01-17 DIAGNOSIS — K219 Gastro-esophageal reflux disease without esophagitis: Secondary | ICD-10-CM | POA: Diagnosis present

## 2012-01-17 HISTORY — PX: FEMORAL-POPLITEAL BYPASS GRAFT: SHX937

## 2012-01-17 HISTORY — PX: INTRAOPERATIVE ARTERIOGRAM: SHX5157

## 2012-01-17 SURGERY — BYPASS GRAFT FEMORAL-POPLITEAL ARTERY
Anesthesia: General | Site: Leg Upper | Laterality: Right | Wound class: Clean

## 2012-01-17 MED ORDER — VECURONIUM BROMIDE 10 MG IV SOLR
INTRAVENOUS | Status: DC | PRN
  Administered 2012-01-17 (×2): 2 mg via INTRAVENOUS
  Administered 2012-01-17: 4 mg via INTRAVENOUS
  Administered 2012-01-17: 2 mg via INTRAVENOUS

## 2012-01-17 MED ORDER — LACTATED RINGERS IV SOLN
INTRAVENOUS | Status: DC | PRN
  Administered 2012-01-17 (×3): via INTRAVENOUS

## 2012-01-17 MED ORDER — HEPARIN SODIUM (PORCINE) 1000 UNIT/ML IJ SOLN
INTRAMUSCULAR | Status: DC | PRN
  Administered 2012-01-17: 6000 [IU] via INTRAVENOUS

## 2012-01-17 MED ORDER — EPHEDRINE SULFATE 50 MG/ML IJ SOLN
INTRAMUSCULAR | Status: DC | PRN
  Administered 2012-01-17: 10 mg via INTRAVENOUS
  Administered 2012-01-17: 5 mg via INTRAVENOUS

## 2012-01-17 MED ORDER — SODIUM CHLORIDE 0.9 % IR SOLN
Status: DC | PRN
  Administered 2012-01-17: 09:00:00

## 2012-01-17 MED ORDER — HYDRALAZINE HCL 20 MG/ML IJ SOLN: 10.0000 mg | INTRAMUSCULAR | Status: DC | PRN

## 2012-01-17 MED ORDER — FENTANYL CITRATE 0.05 MG/ML IJ SOLN
INTRAMUSCULAR | Status: AC
  Filled 2012-01-17: qty 2

## 2012-01-17 MED ORDER — PHENYLEPHRINE HCL 10 MG/ML IJ SOLN
INTRAMUSCULAR | Status: DC | PRN
  Administered 2012-01-17 (×2): 80 ug via INTRAVENOUS
  Administered 2012-01-17 (×3): 40 ug via INTRAVENOUS
  Administered 2012-01-17: 80 ug via INTRAVENOUS
  Administered 2012-01-17: 40 ug via INTRAVENOUS
  Administered 2012-01-17: 120 ug via INTRAVENOUS
  Administered 2012-01-17 (×2): 40 ug via INTRAVENOUS
  Administered 2012-01-17: 80 ug via INTRAVENOUS
  Administered 2012-01-17 (×4): 40 ug via INTRAVENOUS

## 2012-01-17 MED ORDER — ACETAMINOPHEN 325 MG PO TABS: 325.0000 mg | ORAL_TABLET | ORAL | Status: DC | PRN

## 2012-01-17 MED ORDER — FENTANYL CITRATE 0.05 MG/ML IJ SOLN
INTRAMUSCULAR | Status: DC | PRN
  Administered 2012-01-17: 150 ug via INTRAVENOUS
  Administered 2012-01-17 (×4): 50 ug via INTRAVENOUS

## 2012-01-17 MED ORDER — MAGNESIUM SULFATE 40 MG/ML IJ SOLN
2.0000 g | Freq: Once | INTRAMUSCULAR | Status: AC | PRN
  Filled 2012-01-17: qty 50

## 2012-01-17 MED ORDER — ONDANSETRON HCL 4 MG/2ML IJ SOLN
INTRAMUSCULAR | Status: DC | PRN
  Administered 2012-01-17: 4 mg via INTRAVENOUS

## 2012-01-17 MED ORDER — SODIUM CHLORIDE 0.9 % IV SOLN: INTRAVENOUS | Status: DC

## 2012-01-17 MED ORDER — FAMOTIDINE IN NACL 20-0.9 MG/50ML-% IV SOLN
20.0000 mg | Freq: Two times a day (BID) | INTRAVENOUS | Status: DC
  Administered 2012-01-17 – 2012-01-19 (×4): 20 mg via INTRAVENOUS
  Filled 2012-01-17 (×5): qty 50

## 2012-01-17 MED ORDER — ASPIRIN 81 MG PO CHEW
81.0000 mg | CHEWABLE_TABLET | Freq: Every day | ORAL | Status: DC
  Administered 2012-01-17 – 2012-01-19 (×3): 81 mg via ORAL
  Filled 2012-01-17 (×3): qty 1

## 2012-01-17 MED ORDER — OXYCODONE-ACETAMINOPHEN 5-325 MG PO TABS
1.0000 | ORAL_TABLET | ORAL | Status: DC | PRN
  Administered 2012-01-18 (×3): 1 via ORAL
  Administered 2012-01-18 (×2): 2 via ORAL
  Administered 2012-01-19 (×2): 1 via ORAL
  Filled 2012-01-17: qty 1
  Filled 2012-01-17: qty 2
  Filled 2012-01-17 (×3): qty 1
  Filled 2012-01-17: qty 2
  Filled 2012-01-17: qty 1

## 2012-01-17 MED ORDER — MIDAZOLAM HCL 2 MG/2ML IJ SOLN: 0.5000 mg | Freq: Once | INTRAMUSCULAR | Status: DC | PRN

## 2012-01-17 MED ORDER — NEOSTIGMINE METHYLSULFATE 1 MG/ML IJ SOLN
INTRAMUSCULAR | Status: DC | PRN
  Administered 2012-01-17: 3 mg via INTRAVENOUS

## 2012-01-17 MED ORDER — ALPRAZOLAM 0.25 MG PO TABS: 0.2500 mg | ORAL_TABLET | Freq: Every evening | ORAL | Status: DC | PRN

## 2012-01-17 MED ORDER — SODIUM CHLORIDE 0.9 % IV SOLN: 500.0000 mL | Freq: Once | INTRAVENOUS | Status: AC | PRN

## 2012-01-17 MED ORDER — WHITE PETROLATUM GEL
Status: AC
  Administered 2012-01-17: 19:00:00
  Filled 2012-01-17: qty 5

## 2012-01-17 MED ORDER — SENNOSIDES-DOCUSATE SODIUM 8.6-50 MG PO TABS
1.0000 | ORAL_TABLET | Freq: Every evening | ORAL | Status: DC | PRN
  Filled 2012-01-17: qty 1

## 2012-01-17 MED ORDER — METOPROLOL TARTRATE 1 MG/ML IV SOLN: 2.0000 mg | INTRAVENOUS | Status: DC | PRN

## 2012-01-17 MED ORDER — GUAIFENESIN-DM 100-10 MG/5ML PO SYRP: 15.0000 mL | ORAL_SOLUTION | ORAL | Status: DC | PRN

## 2012-01-17 MED ORDER — LABETALOL HCL 5 MG/ML IV SOLN: 10.0000 mg | INTRAVENOUS | Status: DC | PRN

## 2012-01-17 MED ORDER — DEXAMETHASONE SODIUM PHOSPHATE 10 MG/ML IJ SOLN
INTRAMUSCULAR | Status: DC | PRN
  Administered 2012-01-17: 4 mg via INTRAVENOUS

## 2012-01-17 MED ORDER — PHENOL 1.4 % MT LIQD: 1.0000 | OROMUCOSAL | Status: DC | PRN

## 2012-01-17 MED ORDER — DEXTROSE 5 % IV SOLN
1.5000 g | Freq: Two times a day (BID) | INTRAVENOUS | Status: AC
  Administered 2012-01-17 – 2012-01-18 (×2): 1.5 g via INTRAVENOUS
  Filled 2012-01-17 (×2): qty 1.5

## 2012-01-17 MED ORDER — ONDANSETRON HCL 4 MG/2ML IJ SOLN
4.0000 mg | Freq: Four times a day (QID) | INTRAMUSCULAR | Status: DC | PRN
  Administered 2012-01-18 (×2): 4 mg via INTRAVENOUS
  Filled 2012-01-17 (×2): qty 2

## 2012-01-17 MED ORDER — PROMETHAZINE HCL 25 MG/ML IJ SOLN: 6.2500 mg | INTRAMUSCULAR | Status: DC | PRN

## 2012-01-17 MED ORDER — MEPERIDINE HCL 25 MG/ML IJ SOLN: 6.2500 mg | INTRAMUSCULAR | Status: DC | PRN

## 2012-01-17 MED ORDER — POTASSIUM CHLORIDE CRYS ER 20 MEQ PO TBCR: 20.0000 meq | EXTENDED_RELEASE_TABLET | Freq: Once | ORAL | Status: AC | PRN

## 2012-01-17 MED ORDER — SODIUM CHLORIDE 0.9 % IV SOLN
INTRAVENOUS | Status: DC
  Administered 2012-01-17: 21:00:00 via INTRAVENOUS

## 2012-01-17 MED ORDER — IOHEXOL 300 MG/ML  SOLN
INTRAMUSCULAR | Status: DC | PRN
  Administered 2012-01-17: 20 mL via INTRAVENOUS

## 2012-01-17 MED ORDER — DOPAMINE-DEXTROSE 3.2-5 MG/ML-% IV SOLN: 3.0000 ug/kg/min | INTRAVENOUS | Status: DC

## 2012-01-17 MED ORDER — LIDOCAINE HCL (CARDIAC) 20 MG/ML IV SOLN
INTRAVENOUS | Status: DC | PRN
  Administered 2012-01-17: 50 mg via INTRAVENOUS

## 2012-01-17 MED ORDER — FENTANYL CITRATE 0.05 MG/ML IJ SOLN
25.0000 ug | INTRAMUSCULAR | Status: DC | PRN
  Administered 2012-01-17 (×5): 25 ug via INTRAVENOUS

## 2012-01-17 MED ORDER — HYDROMORPHONE HCL PF 1 MG/ML IJ SOLN
0.5000 mg | INTRAMUSCULAR | Status: DC | PRN
  Administered 2012-01-17 – 2012-01-18 (×3): 0.5 mg via INTRAVENOUS
  Filled 2012-01-17 (×2): qty 1

## 2012-01-17 MED ORDER — GLYCOPYRROLATE 0.2 MG/ML IJ SOLN
INTRAMUSCULAR | Status: DC | PRN
  Administered 2012-01-17: 0.4 mg via INTRAVENOUS

## 2012-01-17 MED ORDER — KETOROLAC TROMETHAMINE 30 MG/ML IJ SOLN: 15.0000 mg | Freq: Once | INTRAMUSCULAR | Status: DC | PRN

## 2012-01-17 MED ORDER — MIDAZOLAM HCL 5 MG/5ML IJ SOLN
INTRAMUSCULAR | Status: DC | PRN
  Administered 2012-01-17: 2 mg via INTRAVENOUS

## 2012-01-17 MED ORDER — SIMVASTATIN 40 MG PO TABS
40.0000 mg | ORAL_TABLET | Freq: Every day | ORAL | Status: DC
  Administered 2012-01-17 – 2012-01-18 (×2): 40 mg via ORAL
  Filled 2012-01-17 (×3): qty 1

## 2012-01-17 MED ORDER — ACETAMINOPHEN 650 MG RE SUPP: 325.0000 mg | RECTAL | Status: DC | PRN

## 2012-01-17 MED ORDER — LEVOTHYROXINE SODIUM 112 MCG PO TABS
112.0000 ug | ORAL_TABLET | Freq: Every day | ORAL | Status: DC
  Administered 2012-01-18 – 2012-01-19 (×2): 112 ug via ORAL
  Filled 2012-01-17 (×2): qty 1

## 2012-01-17 MED ORDER — PROPOFOL 10 MG/ML IV EMUL
INTRAVENOUS | Status: DC | PRN
  Administered 2012-01-17: 140 mg via INTRAVENOUS

## 2012-01-17 MED ORDER — DOCUSATE SODIUM 100 MG PO CAPS
100.0000 mg | ORAL_CAPSULE | Freq: Every day | ORAL | Status: DC
  Administered 2012-01-18 – 2012-01-19 (×2): 100 mg via ORAL
  Filled 2012-01-17 (×2): qty 1

## 2012-01-17 MED ORDER — 0.9 % SODIUM CHLORIDE (POUR BTL) OPTIME
TOPICAL | Status: DC | PRN
  Administered 2012-01-17: 1000 mL

## 2012-01-17 MED ORDER — BISACODYL 5 MG PO TBEC: 5.0000 mg | DELAYED_RELEASE_TABLET | Freq: Every day | ORAL | Status: DC | PRN

## 2012-01-17 MED ORDER — ASPIRIN 81 MG PO TABS: 81.0000 mg | ORAL_TABLET | Freq: Every day | ORAL | Status: DC

## 2012-01-17 MED ORDER — PROTAMINE SULFATE 10 MG/ML IV SOLN
INTRAVENOUS | Status: DC | PRN
  Administered 2012-01-17 (×6): 10 mg via INTRAVENOUS
  Administered 2012-01-17: 40 mg via INTRAVENOUS

## 2012-01-17 SURGICAL SUPPLY — 64 items
APL SKNCLS STERI-STRIP NONHPOA (GAUZE/BANDAGES/DRESSINGS) ×2
BANDAGE ESMARK 6X9 LF (GAUZE/BANDAGES/DRESSINGS) IMPLANT
BENZOIN TINCTURE PRP APPL 2/3 (GAUZE/BANDAGES/DRESSINGS) ×3 IMPLANT
BNDG CMPR 9X6 STRL LF SNTH (GAUZE/BANDAGES/DRESSINGS)
BNDG ESMARK 6X9 LF (GAUZE/BANDAGES/DRESSINGS)
CANISTER SUCTION 2500CC (MISCELLANEOUS) ×2 IMPLANT
CANNULA VESSEL W/WING WO/VALVE (CANNULA) ×3 IMPLANT
CATH EMB 4FR 80CM (CATHETERS) ×1 IMPLANT
CLIP LIGATING EXTRA MED SLVR (CLIP) ×2 IMPLANT
CLIP LIGATING EXTRA SM BLUE (MISCELLANEOUS) ×3 IMPLANT
CLOTH BEACON ORANGE TIMEOUT ST (SAFETY) ×2 IMPLANT
COVER SURGICAL LIGHT HANDLE (MISCELLANEOUS) ×4 IMPLANT
CUFF TOURNIQUET SINGLE 34IN LL (TOURNIQUET CUFF) IMPLANT
CUFF TOURNIQUET SINGLE 44IN (TOURNIQUET CUFF) IMPLANT
DRAIN SNY 10X20 3/4 PERF (WOUND CARE) IMPLANT
DRAPE WARM FLUID 44X44 (DRAPE) ×2 IMPLANT
DRAPE X-RAY CASS 24X20 (DRAPES) ×1 IMPLANT
DRSG COVADERM 4X10 (GAUZE/BANDAGES/DRESSINGS) ×2 IMPLANT
DRSG COVADERM 4X6 (GAUZE/BANDAGES/DRESSINGS) ×1 IMPLANT
DRSG COVADERM 4X8 (GAUZE/BANDAGES/DRESSINGS) ×2 IMPLANT
ELECT REM PT RETURN 9FT ADLT (ELECTROSURGICAL) ×2
ELECTRODE REM PT RTRN 9FT ADLT (ELECTROSURGICAL) ×1 IMPLANT
EVACUATOR SILICONE 100CC (DRAIN) IMPLANT
GLOVE BIO SURGEON STRL SZ7 (GLOVE) ×3 IMPLANT
GLOVE BIOGEL PI IND STRL 7.0 (GLOVE) IMPLANT
GLOVE BIOGEL PI IND STRL 7.5 (GLOVE) IMPLANT
GLOVE BIOGEL PI INDICATOR 7.0 (GLOVE) ×4
GLOVE BIOGEL PI INDICATOR 7.5 (GLOVE) ×1
GLOVE ECLIPSE 6.5 STRL STRAW (GLOVE) ×1 IMPLANT
GLOVE SS BIOGEL STRL SZ 7 (GLOVE) IMPLANT
GLOVE SS BIOGEL STRL SZ 7.5 (GLOVE) ×1 IMPLANT
GLOVE SUPERSENSE BIOGEL SZ 7 (GLOVE) ×1
GLOVE SUPERSENSE BIOGEL SZ 7.5 (GLOVE) ×1
GOWN STRL NON-REIN LRG LVL3 (GOWN DISPOSABLE) ×6 IMPLANT
GOWN STRL REIN 2XL LVL4 (GOWN DISPOSABLE) ×1 IMPLANT
INSERT FOGARTY SM (MISCELLANEOUS) IMPLANT
KIT BASIN OR (CUSTOM PROCEDURE TRAY) ×2 IMPLANT
KIT ROOM TURNOVER OR (KITS) ×2 IMPLANT
NS IRRIG 1000ML POUR BTL (IV SOLUTION) ×4 IMPLANT
PACK PERIPHERAL VASCULAR (CUSTOM PROCEDURE TRAY) ×2 IMPLANT
PAD ARMBOARD 7.5X6 YLW CONV (MISCELLANEOUS) ×4 IMPLANT
PADDING CAST COTTON 6X4 STRL (CAST SUPPLIES) IMPLANT
SET COLLECT BLD 21X3/4 12 (NEEDLE) ×1 IMPLANT
SPONGE GAUZE 4X4 12PLY (GAUZE/BANDAGES/DRESSINGS) ×2 IMPLANT
STAPLER VISISTAT 35W (STAPLE) IMPLANT
STOPCOCK 4 WAY LG BORE MALE ST (IV SETS) ×1 IMPLANT
STRIP CLOSURE SKIN 1/2X4 (GAUZE/BANDAGES/DRESSINGS) ×3 IMPLANT
SUT ETHILON 3 0 PS 1 (SUTURE) IMPLANT
SUT MNCRL AB 4-0 PS2 18 (SUTURE) ×1 IMPLANT
SUT PROLENE 5 0 C 1 24 (SUTURE) ×5 IMPLANT
SUT PROLENE 6 0 CC (SUTURE) ×7 IMPLANT
SUT SILK 2 0 SH (SUTURE) ×2 IMPLANT
SUT SILK 4 0 (SUTURE) ×2
SUT SILK 4-0 18XBRD TIE 12 (SUTURE) IMPLANT
SUT VIC AB 2-0 CTX 36 (SUTURE) ×4 IMPLANT
SUT VIC AB 3-0 SH 27 (SUTURE) ×8
SUT VIC AB 3-0 SH 27X BRD (SUTURE) ×2 IMPLANT
SYR 3ML LL SCALE MARK (SYRINGE) ×1 IMPLANT
TOWEL OR 17X24 6PK STRL BLUE (TOWEL DISPOSABLE) ×4 IMPLANT
TOWEL OR 17X26 10 PK STRL BLUE (TOWEL DISPOSABLE) ×4 IMPLANT
TRAY FOLEY CATH 14FRSI W/METER (CATHETERS) ×2 IMPLANT
TUBING EXTENTION W/L.L. (IV SETS) ×1 IMPLANT
UNDERPAD 30X30 INCONTINENT (UNDERPADS AND DIAPERS) ×2 IMPLANT
WATER STERILE IRR 1000ML POUR (IV SOLUTION) ×2 IMPLANT

## 2012-01-17 NOTE — Anesthesia Preprocedure Evaluation (Addendum)
Anesthesia Evaluation  Patient identified by MRN, date of birth, ID band Patient awake    Reviewed: Allergy & Precautions, H&P , NPO status , Patient's Chart, lab work & pertinent test results, reviewed documented beta blocker date and time   History of Anesthesia Complications (+) PONV  Airway Mallampati: II TM Distance: >3 FB Neck ROM: full    Dental No notable dental hx. (+) Dental Advisory Given and Edentulous Upper   Pulmonary neg pulmonary ROS, former smoker (1ppd x 34yrs; quit 11 years ago) breath sounds clear to auscultation  Pulmonary exam normal       Cardiovascular Exercise Tolerance: Good hypertension (elevated at doctor's appointment; not on meds), negative cardio ROS  Rhythm:regular Rate:Normal     Neuro/Psych negative neurological ROS  negative psych ROS   GI/Hepatic negative GI ROS, Neg liver ROS, GERD- (occasional; not recently)  ,  Endo/Other  negative endocrine ROSHypothyroidism   Renal/GU negative Renal ROS     Musculoskeletal   Abdominal   Peds  Hematology negative hematology ROS (+)   Anesthesia Other Findings   Reproductive/Obstetrics negative OB ROS                         Anesthesia Physical Anesthesia Plan  ASA: III  Anesthesia Plan: General ETT   Post-op Pain Management:    Induction:   Airway Management Planned:   Additional Equipment:   Intra-op Plan:   Post-operative Plan:   Informed Consent: I have reviewed the patients History and Physical, chart, labs and discussed the procedure including the risks, benefits and alternatives for the proposed anesthesia with the patient or authorized representative who has indicated his/her understanding and acceptance.   Dental Advisory Given  Plan Discussed with: CRNA and Surgeon  Anesthesia Plan Comments:         Anesthesia Quick Evaluation

## 2012-01-17 NOTE — OR Nursing (Signed)
Hextend 500 ml's infused as ordered by Dr. Arbie Cookey. Will continue to monitor pt.

## 2012-01-17 NOTE — Preoperative (Signed)
Beta Blockers   Reason not to administer Beta Blockers:Not Applicable 

## 2012-01-17 NOTE — Op Note (Signed)
OPERATIVE REPORT  DATE OF SURGERY: 01/17/2012  PATIENT: [name] [name], 66 y.o. female MRN: [id number]  DOB: 02-04-1946  PRE-OPERATIVE DIAGNOSIS: Right leg claudication with occluded right femoral to popliteal bypass  POST-OPERATIVE DIAGNOSIS:  Same  PROCEDURE: Right femoral to above-knee pop tibial bypass with translocated non-reversed great saphenous vein  SURGEON:  Gretta Began, M.D.  PHYSICIAN ASSISTANT: Collins  ANESTHESIA:  Gen.  EBL: 175 ml  Total I/O In: 2000 [I.V.:2000] Out: 400 [Urine:225; Blood:175]  BLOOD ADMINISTERED: None  DRAINS: None  SPECIMEN: None  COUNTS CORRECT:  YES  PLAN OF CARE: PACU stable   PATIENT DISPOSITION:  PACU - hemodynamically stable  PROCEDURE DETAILS: Indication for procedure: Patient 66 year old white female who several years ago had aortobifemoral bypass and bilateral femoral to above-knee popliteal bypasses with Gore-Tex for critical bilateral limb ischemia. She did well with this and has maintained patency on the left side. 6-9 months ago she had occlusion of her right prosthetic femoropopliteal bypass and underwent thrombectomy. She did well and now is coming back with reocclusion of this. I discussed options but recommended vein bypass.  Procedure in detail. The patient was taken to the operating room and placed in supine position where the area of the right groin and right leg were prepped and draped in the usual sterile fashion. And draped in the prior groin scar and carried down to isolate the right limb of aortofemoral graft and the hood of the prior Gore-Tex femoropopliteal bypass. There was extensive diffuse scarring. The right limb of the aortofemoral graft was isolated above the level of the inguinal ligament. Next the saphenous vein was identified at the saphenofemoral junction to the pubis and groin incision. The vein was smaller moderate size. Tripped or branches were ligated with 304 so ties and divided. One skin bridge was made  in the mid thigh for exposure. The old above-knee popliteal incision was reopened and the saphenous vein was attempted to be harvested down and the final incision was made below the knee for vein harvest as well. The vein was small to moderate throughout its course. Vein was ligated distally. The saphenofemoral junction was occluded with a Cooley clamp and the saphenofemoral junction was divided. The saphenofemoral junction was oversewn with a 5-0 Prolene suture. The vein was gently dilated and tributary branches that had not been occluded were occluded with a 4-0 silk ties. The vein was approximately 2-1/2-3 mm throughout its course. Next the bulla seemingly above-knee popliteal incision was exposed. The artery was exposed below the prior graft anastomosis. Should be mentioned that all usable saphenous vein was taken that it became too small at the below-knee position for use due to multiple branches. The artery was quite calcified but was patent. A tunnel was created from the level of the popliteal artery to the right groin. The patient was given 6000 units intravenous heparin. After adequate circulation the right limb of aortofemoral graft was occluded with a Hanley clamp and the profundus and superficial artery were occluded together with a Hanley clamp. The old Gore-Tex graft was transected and taken back to leave a small cuff on the hood of the right limb of the aortofemoral graft. The vein was spatulated was not reversed and was sewn end-to-side to the hood of this with a running 6-0 Prolene suture. This was anastomosis tested and found to be adequate. Next the valvotome was used to lyse the valves to be good flow through the vein. The vein had been marked requesting was brought through a  subcutaneous tunnel down to the level of the above-knee popliteal artery just above the knee. The artery was occluded proximal to this and was opened with the Lovelace Potts scissors. The graft was divided. Then one last  branch and this was transected and at this level with all usable vein being used. This was anastomosed end-to-side to the popliteal artery with a running 6-0 Prolene suture. Prior to completion anastomosis the vein was flushed and anastomosis completed. Patient good Doppler flow in the foot that was dependent on the graft. Intraoperative arteriograms obtained the 21-gauge butterfly needle through the vein near the groin this showed a small vein and small artery anastomosis and three-vessel runoff. The wounds were irrigated with saline hemostasis was obtained with cautery. The patient was given 50 mg of protamine to reverse the heparin. The wounds were closed with 2-0 Vicryl in the subcutaneous tissue in several layers in the groin and thigh and he would harvest incisions were closed with 30 subcutaneous and subcuticular tissue. Sterile dressing was applied and the patient taken to the recovery in stable condition this would probably dictating an operative Isabelle Course part DART take effect this dictation   Gretta Began, M.D. 01/17/2012 12:45 PM

## 2012-01-17 NOTE — OR Nursing (Signed)
Pt. In PACU. B/P low. Meds given by CRNA. Unable to dopple pulses.  Dr. Arbie Cookey notified.

## 2012-01-17 NOTE — OR Nursing (Signed)
Maintaining BP in the low 100's systolic. Right foot is now warm to touch. Able to dopple post. tib without any difficulty. Continuing to apply warm blankets. Pt. Is resting comfortably. Family and Dr. Arbie Cookey updated.  Will continue to monitor.

## 2012-01-17 NOTE — H&P (View-Only) (Signed)
The patient presents today with acute worsening claudication 3 days ago. Patient undergone bifemoral bypass and bilateral femoral to above-knee popliteal bypasses on the same setting for bilateral tissue loss in may of 2010. She did well following this procedure until November of 2012. At that time she had acute occlusion of her right femoral-popliteal bypass. She underwent tubectomy this on 09/11/2011 with intraoperative arteriogram revealing widely patent above-knee popliteal segment. There was mild narrowing at the distal anastomosis. GU reoccluded that evening and was taken back to underwent revision of the distal anastomosis. She had done well for the last 4 months. On 01/06/2012 he had recurrence of claudication. She has not had any rest pain.  Past Medical History  Diagnosis Date  . Peripheral arterial disease   . Hyperlipidemia   . GERD (gastroesophageal reflux disease)   . Thyroid disease     Hypothyroidism  . Hypothyroidism   . Blood transfusion     2 /12 yrs ago  . Hypertension     stress test about 10 yrs ago-turned out it was thyroid disease  . Arthritis     History  Substance Use Topics  . Smoking status: Former Smoker    Quit date: 09/06/2001  . Smokeless tobacco: Not on file  . Alcohol Use: 4.2 oz/week    7 Glasses of wine per week    History reviewed. No pertinent family history.  Allergies  Allergen Reactions  . Morphine And Related Nausea And Vomiting    Current outpatient prescriptions:ALPRAZolam (XANAX) 0.25 MG tablet, Take 0.25 mg by mouth at bedtime as needed. For anxiety , Disp: , Rfl: ;  aspirin 81 MG tablet, Take 81 mg by mouth daily.  , Disp: , Rfl: ;  levothyroxine (SYNTHROID, LEVOTHROID) 112 MCG tablet, Take 112 mcg by mouth daily.  , Disp: , Rfl: ;  simvastatin (ZOCOR) 40 MG tablet, Take 40 mg by mouth at bedtime.  , Disp: , Rfl:   BP 152/50  Pulse 85  Resp 16  Ht 5' 1" (1.549 m)  Wt 128 lb (58.06 kg)  BMI 24.19 kg/m2  SpO2 97%  Body mass index  is 24.19 kg/(m^2).       Review of systems, no change  Is exam well-developed well-nourished white female no acute distress. HEENT normal. Chest clear bilaterally. Heart regular rate and rhythm. 2+ radial pulses bilaterally. 2+ femoral pulses bilaterally. 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 The patient is not able to tolerate this level of claudication since she is quite active. I of the discussed the option of a thrombectomy of her Gore-Tex graft versus vein bypass. Since she has had recurrent occlusion of this Gore-Tex I would recommend vein femoropopliteal. I imaged her vein with SonoSite ultrasound and is patent throughout the thigh. It does become somewhat small at the knee. I recommended right vein femoropopliteal bypass and she understands and wished to proceed. We have scheduled this for 01/17/2012. She knows to notify should she develop any worsening claudication. She reports it has been completely stable since the initial event 3 days ago 

## 2012-01-17 NOTE — Interval H&P Note (Signed)
History and Physical Interval Note:  01/17/2012 8:37 AM  [name] [name]  has presented today for surgery, with the diagnosis of Peripheral Vascular Disease; occluded femoral popliteal bypass  The various methods of treatment have been discussed with the patient and family. After consideration of risks, benefits and other options for treatment, the patient has consented to  Procedure(s) (LRB): BYPASS GRAFT FEMORAL-POPLITEAL ARTERY (Right) as a surgical intervention .  The patients' history has been reviewed, patient examined, no change in status, stable for surgery.  I have reviewed the patients' chart and labs.  Questions were answered to the patient's satisfaction.     Carita Sollars

## 2012-01-17 NOTE — OR Nursing (Signed)
Dr. Arbie Cookey in PACU to reassess pt. No new orders at this time.

## 2012-01-17 NOTE — Anesthesia Procedure Notes (Signed)
Procedure Name: Intubation Date/Time: 01/17/2012 8:50 AM Performed by: Lovie Chol Pre-anesthesia Checklist: Patient identified, Emergency Drugs available, Suction available and Patient being monitored Patient Re-evaluated:Patient Re-evaluated prior to inductionOxygen Delivery Method: Circle system utilized Preoxygenation: Pre-oxygenation with 100% oxygen Intubation Type: IV induction Ventilation: Mask ventilation without difficulty and Oral airway inserted - appropriate to patient size Laryngoscope Size: Miller and 2 Grade View: Grade I Tube type: Oral Tube size: 7.0 mm Number of attempts: 1 Placement Confirmation: ETT inserted through vocal cords under direct vision,  positive ETCO2 and breath sounds checked- equal and bilateral Secured at: 21 cm Tube secured with: Tape Dental Injury: Teeth and Oropharynx as per pre-operative assessment

## 2012-01-17 NOTE — Progress Notes (Signed)
Pt admitted from PACU.  NAD noted, rates pain a 2.  Respirations even and unlabored.  RT ft pulses dopplered.  Rt ft warm to touch.

## 2012-01-17 NOTE — Transfer of Care (Signed)
Immediate Anesthesia Transfer of Care Note  Patient: [name] [name]  Procedure(s) Performed: Procedure(s) (LRB): BYPASS GRAFT FEMORAL-POPLITEAL ARTERY (Right) INTRA OPERATIVE ARTERIOGRAM (Right)  Patient Location: PACU  Anesthesia Type: General  Level of Consciousness: awake  Airway & Oxygen Therapy: Patient Spontanous Breathing and Patient connected to nasal cannula oxygen  Post-op Assessment: Report given to PACU RN and Post -op Vital signs reviewed and stable  Post vital signs: Reviewed  Complications: No apparent anesthesia complications

## 2012-01-17 NOTE — OR Nursing (Signed)
Report received from J. Edwyna Shell, RN.

## 2012-01-17 NOTE — Anesthesia Postprocedure Evaluation (Signed)
Anesthesia Post Note  Patient: [name] [name]  Procedure(s) Performed: Procedure(s) (LRB): BYPASS GRAFT FEMORAL-POPLITEAL ARTERY (Right) INTRA OPERATIVE ARTERIOGRAM (Right)  Anesthesia type: GA  Patient location: PACU  Post pain: Pain level controlled  Post assessment: Post-op Vital signs reviewed  Last Vitals:  Filed Vitals:   01/17/12 1322  BP:   Pulse:   Temp: 36.9 C  Resp:     Post vital signs: Reviewed  Level of consciousness: sedated  Complications: No apparent anesthesia complications

## 2012-01-18 LAB — BASIC METABOLIC PANEL
GFR calc Af Amer: >90 mL/min (ref >90)
GFR calc non Af Amer: >90 mL/min (ref >90)
Potassium: 4.1 mEq/L (ref 3.5–5.1)
Sodium: 137 mEq/L (ref 135–145)

## 2012-01-18 LAB — CBC
MCHC: 33.1 g/dL (ref 30.0–36.0)
Platelets: 145 10*3/uL — ABNORMAL LOW (ref 150–400)
RDW: 14.2 % (ref 11.5–15.5)

## 2012-01-18 MED FILL — Hetastarch (HES /0.7 or /0.75) 6% in Electrolytes IV Soln: INTRAVENOUS | Qty: 500 | Status: AC

## 2012-01-18 NOTE — Progress Notes (Signed)
Subjective: Interval History: none.. some nausea with her pain pills this morning. Minimal discomfort except for around the knee area.  Objective: Vital signs in last 24 hours: Temp:  [97.4 F (36.3 C)-98.4 F (36.9 C)] 98.4 F (36.9 C) (03/21 0344) Pulse Rate:  [66-96] 69  (03/21 0344) Resp:  [12-22] 14  (03/21 0344) BP: (90-106)/(36-65) 102/42 mmHg (03/21 0344) SpO2:  [94 %-99 %] 99 % (03/21 0344) FiO2 (%):  [2 %] 2 % (03/20 1820) Weight:  [139 lb 5.3 oz (63.2 kg)] 139 lb 5.3 oz (63.2 kg) (03/20 1820)  Intake/Output from previous day: 03/20 0701 - 03/21 0700 In: 3650 [I.V.:3650] Out: 1975 [Urine:1775; Blood:200] Intake/Output this shift:    Physical exam: Up in chair in no distress. Her dressings were all intact without hematoma. She has palpable popliteal pulse and a good Doppler flow in the dorsalis pedis and posterior tibial.  Lab Results:  Basename 01/18/12 0405  WBC 9.3  HGB 9.6*  HCT 29.0*  PLT 145*   BMET  Basename 01/18/12 0405  NA 137  K 4.1  CL 107  CO2 25  GLUCOSE 107*  BUN 11  CREATININE 0.66  CALCIUM 7.4*    Studies/Results: Dg Ang/ext/uni/or Right  01/17/2012  *RADIOLOGY REPORT*  Clinical Data: Arteriogram  RIGHT LOWER EXTREMITY ARTERIOGRAM  Findings: Contrast fills the bypass graft.  The anastomosis to the popliteal artery above the knee is patent.  The popliteal arteries patent.  Three-vessel runoff to the distal leg is demonstrated.  IMPRESSION: Three-vessel runoff.  Patent distal graft.  Original Report Authenticated By: Donavan Burnet, M.D.   Anti-infectives: Anti-infectives     Start     Dose/Rate Route Frequency Ordered Stop   01/17/12 2000   cefUROXime (ZINACEF) 1.5 g in dextrose 5 % 50 mL IVPB        1.5 g 100 mL/hr over 30 Minutes Intravenous Every 12 hours 01/17/12 1855 01/18/12 1959   01/16/12 1445   cefUROXime (ZINACEF) 1.5 g in dextrose 5 % 50 mL IVPB        1.5 g 100 mL/hr over 30 Minutes Intravenous  Once 01/16/12 1443  01/17/12 0848          Assessment/Plan: s/p Procedure(s) (LRB): BYPASS GRAFT FEMORAL-POPLITEAL ARTERY (Right) INTRA OPERATIVE ARTERIOGRAM (Right) Stable postop day 1 with a well-perfused right foot. We will mobilize and DC her Foley catheter today. She will be for discharge in one to 2 days when she is comfortable walking.   LOS: 1 day   Robynn Marcel 01/18/2012, 9:00 AM

## 2012-01-19 ENCOUNTER — Encounter (HOSPITAL_COMMUNITY): Payer: Self-pay | Admitting: Vascular Surgery

## 2012-01-19 ENCOUNTER — Other Ambulatory Visit: Payer: Self-pay | Admitting: Thoracic Diseases

## 2012-01-19 DIAGNOSIS — I739 Peripheral vascular disease, unspecified: Secondary | ICD-10-CM

## 2012-01-19 MED ORDER — OXYCODONE-ACETAMINOPHEN 5-325 MG PO TABS: 1.0000 | ORAL_TABLET | ORAL | Status: AC | PRN

## 2012-01-19 NOTE — Progress Notes (Signed)
VVS POD# 2 right fem-pop Pt doing well. Ready to go home excellent doppler PT/DP on right. Foot warm with good sensation and motion. Pain well controlled  See full note in DC summary  Home today

## 2012-01-19 NOTE — Progress Notes (Signed)
Pt. Being discharged home via wheelchair. Have reviewed all discharge instructions with patient, no further questions or concerns voiced at this time.

## 2012-01-19 NOTE — Discharge Summary (Signed)
[facility] Discharge Summary   Patient ID:  [name] [name] MRN: [id number] DOB/AGE: 1946-05-09 66 y.o.  Admit date: 01/17/2012 Discharge date: 01/19/2012 Date of Surgery: 01/17/2012 Surgeon: Surgeon(s): Larina Earthly, MD  Admission Diagnosis: Peripheral Vascular Disease; occluded femoral popliteal bypass  Discharge Diagnoses:  Peripheral Vascular Disease; occluded femoral popliteal bypass  Secondary Diagnoses: Past Medical History  Diagnosis Date  . Peripheral arterial disease   . Hyperlipidemia   . GERD (gastroesophageal reflux disease)   . Thyroid disease     Hypothyroidism  . Hypothyroidism   . Blood transfusion     2 /12 yrs ago  . Arthritis   . PONV (postoperative nausea and vomiting)     N/v with morphine  . Hypertension     stress test about 10 yrs ago-turned out it was thyroid disease    Procedure(s): BYPASS GRAFT FEMORAL-POPLITEAL ARTERY INTRA OPERATIVE ARTERIOGRAM  Discharged Condition: good  HPI: [name] [name] is a 66 y.o. female with worsening claudication in RLE. Patient undergone bifemoral bypass and bilateral femoral to above-knee popliteal bypasses on the same setting for bilateral tissue loss in may of 2010. She did well following this procedure until November of 2012. At that time she had acute occlusion of her right femoral-popliteal bypass. She underwent tubectomy this on 09/11/2011 with intraoperative arteriogram revealing widely patent above-knee popliteal segment. There was mild narrowing at the distal anastomosis. GU reoccluded that evening and was taken back to underwent revision of the distal anastomosis. She had done well for the last 4 months. On 01/06/2012 he had recurrence of claudication. She has not had any rest pain. Pt admitted forRight femoral to above-knee pop tibial bypass with translocated non-reversed great saphenous vein   Hospital Course:  [name] [name] is a 66 y.o. female is S/P Right Procedure(s): BYPASS GRAFT  FEMORAL-POPLITEAL ARTERY INTRA OPERATIVE ARTERIOGRAM Extubated: POD # 0 Post-op wounds healing well Pt. Ambulating, voiding and taking PO diet without difficulty. Pt pain controlled with PO pain meds. Labs as below Complications:none  Consults:     Significant Diagnostic Studies: CBC Lab Results  Component Value Date   WBC 9.3 01/18/2012   HGB 9.6* 01/18/2012   HCT 29.0* 01/18/2012   MCV 87.6 01/18/2012   PLT 145* 01/18/2012    BMET    Component Value Date/Time   NA 137 01/18/2012 0405   K 4.1 01/18/2012 0405   CL 107 01/18/2012 0405   CO2 25 01/18/2012 0405   GLUCOSE 107* 01/18/2012 0405   BUN 11 01/18/2012 0405   CREATININE 0.66 01/18/2012 0405   CALCIUM 7.4* 01/18/2012 0405   GFRNONAA >90 01/18/2012 0405   GFRAA >90 01/18/2012 0405   COAG Lab Results  Component Value Date   INR 0.91 01/12/2012   INR 0.90 09/08/2011   INR 1.3 03/02/2009     Disposition:  Discharge to :Home Discharge Orders    Future Orders Please Complete By Expires   Resume previous diet      Driving Restrictions      Comments:   No driving for 4 weeks   Lifting restrictions      Comments:   No lifting for 4 weeks   Call MD for:  temperature >100.5      Call MD for:  redness, tenderness, or signs of infection (pain, swelling, bleeding, redness, odor or green/yellow discharge around incision site)      Call MD for:  severe or increased pain, loss or decreased feeling  in affected  limb(s)      Increase activity slowly      Comments:   Walk with assistance use walker or cane as needed   Walker       May shower       Scheduling Instructions:   Saturday   Discharge wound care:      Comments:   Paint wounds with betadine 1-2 times per day until oozing/bleeding stops   may wash over wound with mild soap and water         [place], Rehmann  Home Medication Instructions [id number]   Printed on:01/19/12 1144  Medication Information                    levothyroxine (SYNTHROID, LEVOTHROID) 112 MCG  tablet Take 112 mcg by mouth daily.             aspirin 81 MG tablet Take 81 mg by mouth daily.             simvastatin (ZOCOR) 40 MG tablet Take 40 mg by mouth at bedtime.             ALPRAZolam (XANAX) 0.25 MG tablet Take 0.25 mg by mouth at bedtime as needed. For anxiety            Cholecalciferol (VITAMIN D) 2000 UNITS CAPS Take 1 capsule by mouth daily.           Calcium Carbonate-Vitamin D (CALCIUM + D PO) Take 1 tablet by mouth 2 (two) times daily.           oxyCODONE-acetaminophen (PERCOCET) 5-325 MG per tablet Take 1-2 tablets by mouth every 4 (four) hours as needed.            Verbal and written Discharge instructions given to the patient. Wound care per Discharge AVS Follow-up Information    Follow up with EARLY, TODD, MD in 4 weeks. (office will arrange -sent)    Contact information:   [address] [place] [address] [phone]          Signed: Marlowe Shores 01/19/2012, 11:44 AM

## 2012-01-23 ENCOUNTER — Other Ambulatory Visit: Payer: Medicare Other

## 2012-01-23 ENCOUNTER — Ambulatory Visit: Payer: Medicare Other | Admitting: Vascular Surgery

## 2012-02-14 ENCOUNTER — Telehealth: Payer: Self-pay

## 2012-02-14 DIAGNOSIS — G8918 Other acute postprocedural pain: Secondary | ICD-10-CM

## 2012-02-14 MED ORDER — HYDROCODONE-ACETAMINOPHEN 5-500 MG PO TABS: ORAL_TABLET | ORAL | Status: DC

## 2012-02-14 NOTE — Telephone Encounter (Addendum)
Phone call from pt. States has a burning and stinging-type pain in right leg, late afternoon, after being up and about during day.  States notices some increased swelling of right leg in afternoon.  Denies that the pain is severe.  Rates pain level at 5-6, and states only takes pain medication at night, to help her rest more.  Requests a refill on pain medication.  Advised pt. nurse will fax order for Hydrocodone/Acetaminophen per standing order.  Agrees w/ plan.  Has f/u appt. 02/20/12.

## 2012-02-19 ENCOUNTER — Encounter: Payer: Self-pay | Admitting: Vascular Surgery

## 2012-02-20 ENCOUNTER — Ambulatory Visit (INDEPENDENT_AMBULATORY_CARE_PROVIDER_SITE_OTHER): Payer: Medicare Other | Admitting: Thoracic Diseases

## 2012-02-20 ENCOUNTER — Encounter (INDEPENDENT_AMBULATORY_CARE_PROVIDER_SITE_OTHER): Payer: Medicare Other | Admitting: *Deleted

## 2012-02-20 ENCOUNTER — Encounter: Payer: Self-pay | Admitting: Vascular Surgery

## 2012-02-20 VITALS — BP 138/44 | HR 79 | Temp 98.1°F | Ht 61.0 in | Wt 125.0 lb

## 2012-02-20 DIAGNOSIS — I739 Peripheral vascular disease, unspecified: Secondary | ICD-10-CM

## 2012-02-20 DIAGNOSIS — I70219 Atherosclerosis of native arteries of extremities with intermittent claudication, unspecified extremity: Secondary | ICD-10-CM

## 2012-02-20 DIAGNOSIS — Z48812 Encounter for surgical aftercare following surgery on the circulatory system: Secondary | ICD-10-CM

## 2012-02-20 NOTE — Progress Notes (Signed)
[facility] [place]  Postoperative Visit Bypass Surgery Date of Surgery: 01/17/12 Right femoral to above-knee pop tibial bypass with translocated non-reversed great saphenous vein  Surgeon: TFE  History of Present Illness  [name] [name] is a 66 y.o. female who presents for postoperative follow-up for: right fem-pop bypass with vein surgery.  The patient's wounds are healed.  Patients pain is well controlled.  The patient's pre-operative symptoms of claudication are Improved. She states she has been walking about 1/2 mile without difficulty.  She C/O some swelling and occasional burning pain at the above knee harvest incision but otherwise feels great. Pt. Also has left fem-pop with gortex and has had aortobifem bypass.  VASC. LAB Studies: 02/20/2012        ABI: Right 0.91;  Left 1.10;        Bypass is open  Physical Examination  Filed Vitals:   02/20/12 1500  BP: 138/44  Pulse: 79  Temp: 98.1 F (36.7 C)    Pt is A&O x 3 Gait is normal right upper extremity: Incision/s is/are clean,dry.intact, and  healed. RLE -some mild  Edema at the knee and generalized in the thigh, with no erythema; with no hematoma  Dorsalis Pedis pulse is palpable bilaterally Posterior tibial pulse is  Palpable bilaterally    Medical Decision Making  [name] [name] is a 66 y.o. year old female who presents s/p right lower extremity bypass surgery .  The patient's bypass incisions are healed with good resolution of pre-operative symptoms. The patient's surveillance will included ABI and bypass duplex studies which will be  in: 3 months.    Clinic MD: TFE

## 2012-03-01 ENCOUNTER — Ambulatory Visit: Payer: Medicare Other | Admitting: *Deleted

## 2012-03-01 ENCOUNTER — Telehealth: Payer: Self-pay | Admitting: Neurosurgery

## 2012-03-01 DIAGNOSIS — I7092 Chronic total occlusion of artery of the extremities: Secondary | ICD-10-CM

## 2012-03-01 NOTE — Telephone Encounter (Addendum)
Message copied by Shari Prows on Fri Mar 01, 2012  9:21 AM ------      Message from: Phillips Odor      Created: Thu Feb 29, 2012  4:40 PM      Regarding: add to NP schedule 5/3      Contact: [phone]       Please add pt. At 10:00 am to Rusty's schedule.  C/o suture that has surfaced and is irritated/ s/p right fem-pop-tibial BP of 3/20 Added pt to Rusty's schedule per Carol's note above/awt

## 2012-03-04 NOTE — Progress Notes (Signed)
Pt came in today with a very superficial stitch migrating out of her lower leg incision. No infection noted, no fever or drainage reported by patient. I clipped to stitch, placed Bacitracin on incision and instructed the patient on cleaning. She voiced understanding. All of her wounds were well healed and giving her no discomfort.

## 2012-05-13 ENCOUNTER — Encounter: Payer: Self-pay | Admitting: Vascular Surgery

## 2012-05-14 ENCOUNTER — Ambulatory Visit (INDEPENDENT_AMBULATORY_CARE_PROVIDER_SITE_OTHER): Payer: Medicare Other | Admitting: *Deleted

## 2012-05-14 ENCOUNTER — Encounter: Payer: Self-pay | Admitting: Vascular Surgery

## 2012-05-14 ENCOUNTER — Encounter (INDEPENDENT_AMBULATORY_CARE_PROVIDER_SITE_OTHER): Payer: Medicare Other | Admitting: *Deleted

## 2012-05-14 ENCOUNTER — Ambulatory Visit (INDEPENDENT_AMBULATORY_CARE_PROVIDER_SITE_OTHER): Payer: Medicare Other | Admitting: Vascular Surgery

## 2012-05-14 ENCOUNTER — Ambulatory Visit: Payer: Medicare Other | Admitting: Vascular Surgery

## 2012-05-14 VITALS — BP 146/42 | HR 71 | Temp 98.3°F | Ht 61.0 in | Wt 126.0 lb

## 2012-05-14 DIAGNOSIS — Z48812 Encounter for surgical aftercare following surgery on the circulatory system: Secondary | ICD-10-CM

## 2012-05-14 DIAGNOSIS — I70219 Atherosclerosis of native arteries of extremities with intermittent claudication, unspecified extremity: Secondary | ICD-10-CM

## 2012-05-14 DIAGNOSIS — I739 Peripheral vascular disease, unspecified: Secondary | ICD-10-CM

## 2012-05-14 NOTE — Progress Notes (Signed)
The patient has today for followup of her lower surety arterial insufficiency. She is status post aortobifemoral and combined bilateral femoral-popliteal bypasses in 2010 for critical limb ischemia and tissue loss bilaterally. She's had no difficulty with her aortofemoral graft or her left femoropopliteal bypass. She has had failure of her prosthetic right graft and replacement in March of the vein femoropopliteal in 2013. She does report some mild calf claudication symptoms on the right. She's had no difficulty on the left. She's had no tissue loss.  Past Medical History  Diagnosis Date  . Peripheral arterial disease   . Hyperlipidemia   . GERD (gastroesophageal reflux disease)   . Thyroid disease     Hypothyroidism  . Hypothyroidism   . Blood transfusion     2 /12 yrs ago  . Arthritis   . PONV (postoperative nausea and vomiting)     N/v with morphine  . Hypertension     stress test about 10 yrs ago-turned out it was thyroid disease    History  Substance Use Topics  . Smoking status: Former Smoker    Quit date: 09/06/2001  . Smokeless tobacco: Not on file  . Alcohol Use: 4.2 oz/week    7 Glasses of wine per week     wine    Family History  Problem Relation Age of Onset  . Anesthesia problems Neg Hx   . Hypotension Neg Hx   . Malignant hyperthermia Neg Hx   . Pseudochol deficiency Neg Hx     Allergies  Allergen Reactions  . Morphine And Related Nausea And Vomiting    Current outpatient prescriptions:ALPRAZolam (XANAX) 0.25 MG tablet, Take 0.25 mg by mouth at bedtime as needed. For anxiety , Disp: , Rfl: ;  aspirin 81 MG tablet, Take 81 mg by mouth daily.  , Disp: , Rfl: ;  Calcium Carbonate-Vitamin D (CALCIUM + D PO), Take 1 tablet by mouth 2 (two) times daily., Disp: , Rfl: ;  Cholecalciferol (VITAMIN D) 2000 UNITS CAPS, Take 1 capsule by mouth daily., Disp: , Rfl:  levothyroxine (SYNTHROID, LEVOTHROID) 112 MCG tablet, Take 112 mcg by mouth daily.  , Disp: , Rfl: ;   simvastatin (ZOCOR) 40 MG tablet, Take 40 mg by mouth at bedtime.  , Disp: , Rfl: ;  HYDROcodone-acetaminophen (VICODIN) 5-500 MG per tablet, Take 1-2 tabs q 6 hrs prn/pain, Disp: 20 tablet, Rfl: 0  BP 146/42  Pulse 71  Temp 98.3 F (36.8 C) (Oral)  Ht 5' 1" (1.549 m)  Wt 126 lb (57.153 kg)  BMI 23.81 kg/m2  SpO2 100%  Body mass index is 23.81 kg/(m^2).       Physical exam well-developed well-nourished white female appearing stated age in no acute distress. She does have 2+ femoral pulses bilaterally she has 1+ right dorsalis pedis and 2+ left dorsalis pedis pulse. Her incisions are all healed well.  Noninvasive vascular laboratory studies revealed drop in her right ankle arm index to 0.70 down from 0.95. She does have scarring of the anastomosis but does appear to have elevated velocities as well  Impression and plan drop in ankle arm index and also evidence of stenosis by duplex of her right femoropopliteal bypass. I have recommended arteriography for further evaluation. I explained I would not intervene on the basis of her symptoms of mild claudication concerned about occlusion of her femoropopliteal bypass. She understands and we'll schedule this as an outpatient on 05/22/2012 

## 2012-05-16 ENCOUNTER — Encounter (HOSPITAL_COMMUNITY): Payer: Self-pay | Admitting: Pharmacy Technician

## 2012-05-20 ENCOUNTER — Other Ambulatory Visit: Payer: Self-pay

## 2012-05-24 NOTE — Procedures (Unsigned)
BYPASS GRAFT EVALUATION  INDICATION:  Followup right fem-pop graft.  HISTORY: Diabetes:  No Cardiac: Hypertension:  No Smoking:  Previous Previous Surgery:  Aortic bifemoral graft with bilateral fem-pop grafts placed 03/02/2009.  New right fem-pop graft was placed subsequent to occlusion 01/17/2012.  SINGLE LEVEL ARTERIAL EXAM                              RIGHT              LEFT Brachial: Anterior tibial: Posterior tibial: Peroneal: Ankle/brachial index:        0.70               1.13  PREVIOUS ABI:  Date:  02/20/2012  RIGHT:  0.95  LEFT:  1.1  LOWER EXTREMITY BYPASS GRAFT DUPLEX EXAM:  DUPLEX:  Patent right fem-pop graft with significantly elevated velocities in the area of the distal anastomosis.  Area is difficult to evaluate due to surgical scarring, arterial calcification and location. Suspect severe stenosis. Please see attached diagram for details.  IMPRESSION:  Patent right femoral-popliteal graft with suspected significant stenosis at the distal anastomosis as described above.  ___________________________________________ Larina Earthly, M.D.  LT/MEDQ  D:  05/14/2012  T:  05/14/2012  Job:  [id number]

## 2012-05-28 MED ORDER — SODIUM CHLORIDE 0.9 % IV SOLN: INTRAVENOUS | Status: DC

## 2012-05-29 ENCOUNTER — Ambulatory Visit (HOSPITAL_COMMUNITY)
Admission: RE | Admit: 2012-05-29 | Discharge: 2012-05-29 | Disposition: A | Discharge status: Home / Self Care | Payer: Medicare Other | Source: Ambulatory Visit | Attending: Vascular Surgery | Admitting: Vascular Surgery

## 2012-05-29 ENCOUNTER — Encounter (HOSPITAL_COMMUNITY)
Admission: RE | Disposition: A | Discharge status: Home / Self Care | Payer: Self-pay | Source: Ambulatory Visit | Attending: Vascular Surgery

## 2012-05-29 DIAGNOSIS — I70409 Unspecified atherosclerosis of autologous vein bypass graft(s) of the extremities, unspecified extremity: Secondary | ICD-10-CM | POA: Insufficient documentation

## 2012-05-29 DIAGNOSIS — I739 Peripheral vascular disease, unspecified: Secondary | ICD-10-CM

## 2012-05-29 HISTORY — PX: ABDOMINAL AORTAGRAM: SHX5454

## 2012-05-29 LAB — POCT I-STAT, CHEM 8
Creatinine, Ser: 0.8 mg/dL (ref 0.50–1.10)
Glucose, Bld: 95 mg/dL (ref 70–99)
Hemoglobin: 15.6 g/dL — ABNORMAL HIGH (ref 12.0–15.0)
Sodium: 139 mEq/L (ref 135–145)
TCO2: 21 mmol/L (ref 0–100)

## 2012-05-29 SURGERY — ABDOMINAL AORTAGRAM
Anesthesia: LOCAL

## 2012-05-29 MED ORDER — ACETAMINOPHEN 325 MG RE SUPP
325.0000 mg | RECTAL | Status: DC | PRN
  Filled 2012-05-29: qty 2

## 2012-05-29 MED ORDER — ALUM & MAG HYDROXIDE-SIMETH 200-200-20 MG/5ML PO SUSP
15.0000 mL | ORAL | Status: DC | PRN
  Filled 2012-05-29: qty 30

## 2012-05-29 MED ORDER — SODIUM CHLORIDE 0.9 % IV SOLN: INTRAVENOUS | Status: DC

## 2012-05-29 MED ORDER — METOPROLOL TARTRATE 1 MG/ML IV SOLN
2.0000 mg | INTRAVENOUS | Status: DC | PRN
Start: 2012-05-29 — End: 2012-05-29

## 2012-05-29 MED ORDER — HYDRALAZINE HCL 20 MG/ML IJ SOLN: 10.0000 mg | INTRAMUSCULAR | Status: DC | PRN

## 2012-05-29 MED ORDER — GUAIFENESIN-DM 100-10 MG/5ML PO SYRP
15.0000 mL | ORAL_SOLUTION | ORAL | Status: DC | PRN
  Filled 2012-05-29: qty 15

## 2012-05-29 MED ORDER — MIDAZOLAM HCL 2 MG/2ML IJ SOLN
INTRAMUSCULAR | Status: AC
  Filled 2012-05-29: qty 2

## 2012-05-29 MED ORDER — ONDANSETRON HCL 4 MG/2ML IJ SOLN: 4.0000 mg | Freq: Four times a day (QID) | INTRAMUSCULAR | Status: DC | PRN

## 2012-05-29 MED ORDER — PHENOL 1.4 % MT LIQD: 1.0000 | OROMUCOSAL | Status: DC | PRN

## 2012-05-29 MED ORDER — ACETAMINOPHEN 325 MG PO TABS
325.0000 mg | ORAL_TABLET | ORAL | Status: DC | PRN
  Filled 2012-05-29: qty 2

## 2012-05-29 MED ORDER — FENTANYL CITRATE 0.05 MG/ML IJ SOLN
INTRAMUSCULAR | Status: AC
  Filled 2012-05-29: qty 2

## 2012-05-29 MED ORDER — LIDOCAINE HCL (PF) 1 % IJ SOLN
INTRAMUSCULAR | Status: AC
  Filled 2012-05-29: qty 30

## 2012-05-29 MED ORDER — LABETALOL HCL 5 MG/ML IV SOLN: 10.0000 mg | INTRAVENOUS | Status: DC | PRN

## 2012-05-29 MED ORDER — HEPARIN (PORCINE) IN NACL 2-0.9 UNIT/ML-% IJ SOLN
INTRAMUSCULAR | Status: AC
  Filled 2012-05-29: qty 1000

## 2012-05-29 NOTE — Interval H&P Note (Signed)
History and Physical Interval Note:  05/29/2012 11:02 AM  [name] [name]  has presented today for surgery, with the diagnosis of right femoral pop bypass stenosis  The various methods of treatment have been discussed with the patient and family. After consideration of risks, benefits and other options for treatment, the patient has consented to  Procedure(s) (LRB): ABDOMINAL AORTAGRAM (N/A) as a surgical intervention .  The patient's history has been reviewed, patient examined, no change in status, stable for surgery.  I have reviewed the patient's chart and labs.  Questions were answered to the patient's satisfaction.     Kyilee Gregg IV, V. WELLS

## 2012-05-29 NOTE — H&P (View-Only) (Signed)
The patient has today for followup of her lower surety arterial insufficiency. She is status post aortobifemoral and combined bilateral femoral-popliteal bypasses in 2010 for critical limb ischemia and tissue loss bilaterally. She's had no difficulty with her aortofemoral graft or her left femoropopliteal bypass. She has had failure of her prosthetic right graft and replacement in March of the vein femoropopliteal in 2013. She does report some mild calf claudication symptoms on the right. She's had no difficulty on the left. She's had no tissue loss.  Past Medical History  Diagnosis Date  . Peripheral arterial disease   . Hyperlipidemia   . GERD (gastroesophageal reflux disease)   . Thyroid disease     Hypothyroidism  . Hypothyroidism   . Blood transfusion     2 /12 yrs ago  . Arthritis   . PONV (postoperative nausea and vomiting)     N/v with morphine  . Hypertension     stress test about 10 yrs ago-turned out it was thyroid disease    History  Substance Use Topics  . Smoking status: Former Smoker    Quit date: 09/06/2001  . Smokeless tobacco: Not on file  . Alcohol Use: 4.2 oz/week    7 Glasses of wine per week     wine    Family History  Problem Relation Age of Onset  . Anesthesia problems Neg Hx   . Hypotension Neg Hx   . Malignant hyperthermia Neg Hx   . Pseudochol deficiency Neg Hx     Allergies  Allergen Reactions  . Morphine And Related Nausea And Vomiting    Current outpatient prescriptions:ALPRAZolam (XANAX) 0.25 MG tablet, Take 0.25 mg by mouth at bedtime as needed. For anxiety , Disp: , Rfl: ;  aspirin 81 MG tablet, Take 81 mg by mouth daily.  , Disp: , Rfl: ;  Calcium Carbonate-Vitamin D (CALCIUM + D PO), Take 1 tablet by mouth 2 (two) times daily., Disp: , Rfl: ;  Cholecalciferol (VITAMIN D) 2000 UNITS CAPS, Take 1 capsule by mouth daily., Disp: , Rfl:  levothyroxine (SYNTHROID, LEVOTHROID) 112 MCG tablet, Take 112 mcg by mouth daily.  , Disp: , Rfl: ;   simvastatin (ZOCOR) 40 MG tablet, Take 40 mg by mouth at bedtime.  , Disp: , Rfl: ;  HYDROcodone-acetaminophen (VICODIN) 5-500 MG per tablet, Take 1-2 tabs q 6 hrs prn/pain, Disp: 20 tablet, Rfl: 0  BP 146/42  Pulse 71  Temp 98.3 F (36.8 C) (Oral)  Ht 5\' 1"  (1.549 m)  Wt 126 lb (57.153 kg)  BMI 23.81 kg/m2  SpO2 100%  Body mass index is 23.81 kg/(m^2).       Physical exam well-developed well-nourished white female appearing stated age in no acute distress. She does have 2+ femoral pulses bilaterally she has 1+ right dorsalis pedis and 2+ left dorsalis pedis pulse. Her incisions are all healed well.  Noninvasive vascular laboratory studies revealed drop in her right ankle arm index to 0.70 down from 0.95. She does have scarring of the anastomosis but does appear to have elevated velocities as well  Impression and plan drop in ankle arm index and also evidence of stenosis by duplex of her right femoropopliteal bypass. I have recommended arteriography for further evaluation. I explained I would not intervene on the basis of her symptoms of mild claudication concerned about occlusion of her femoropopliteal bypass. She understands and we'll schedule this as an outpatient on 05/22/2012

## 2012-05-29 NOTE — Op Note (Signed)
[facility]  Patient name: [name] [name] MRN: [id number] DOB: May 21, 1946 Sex: female  05/29/2012 Pre-operative Diagnosis: Bypass graft stenosis Post-operative diagnosis:  Same Surgeon:  Jorge Ny Procedure Performed:  1.  Ultrasound Access left femoral artery  2.  aortogram  3.  Bilateral lower extremity runoff  4.  1st order    Indications:  The patient has had a drop in ankle arm index and also evidence of stenosis by duplex of her right femoropopliteal bypass. Dr Arbie Cookey has recommended arteriography for further evaluation   Procedure:  The patient was identified in the holding area and taken to room 8.  The patient was then placed supine on the table and prepped and draped in the usual sterile fashion.  A time out was called.  Ultrasound was used to evaluate the left common femoral artery.  It was patent .  A digital ultrasound image was acquired.  A micropuncture needle was used to access the left common femoral artery under ultrasound guidance.  An 018 wire was advanced without resistance and a micropuncture sheath was placed.  The 018 wire was removed and a benson wire was placed.  The micropuncture sheath was exchanged for a 5 french sheath.  An omniflush catheter was advanced over the wire to the level of L-1.  An abdominal angiogram was obtained.  Next, using a SOS catheter and a benson wire, the aortic bifurcation was crossed and the catheter was placed into theright common iliac artery and right runoff was obtained.  left runoff was performed via retrograde sheath injections.  Findings:   Aortogram:  The visualized portions of the suprarenal abdominal aorta showed no significant disease. There are single renal arteries bilaterally. A infrarenal graft is visualized with the anastomosis several centimeters below the renal arteries. There is somewhat of a size discrepancy between the native artery and the bypass graft. No stenosis is identified. The  right and left limb of the aortobifemoral bypass grafts are widely patent.  Right Lower Extremity:  The right profunda femoral artery is patent throughout it's course. There is a bypass graft originating from the right common femoral artery. There is an approximately a 70-80% narrowing within the proximal portion of the bypass graft extending into the anastomosis. The bypass graft is patent throughout it's course. The distal anastomosis is to the above-knee popliteal artery. There is approximately a 80% stenosis within the above-knee popliteal artery at the distal anastomosis. The below knee popliteal artery is widely patent with three-vessel runoff.  Left Lower Extremity:  The left profunda femoral artery is widely patent and a bypass graft is visualized from the left common femoral to the above-knee popliteal artery. No stenosis is identified within the bypass graft however contrast does appear to be quicker and the profunda femoral artery. The popliteal artery is patent throughout it's course and there is three-vessel runoff the left leg.  Intervention:  None  Impression:  #1  widely patent aorta bifemoral bypass graft  #2  the right femoral to above-knee popliteal artery bypass graft is patent however there is approximately a 70-80% narrowing in the proximal portion of the bypass graft at the level of the proximal anastomosis. There is a 80% stenosis at the distal anastomosis.  #3  widely patent left femoral-popliteal bypass graft  #4  three-vessel runoff bilaterally    V. Durene Cal, M.D. [facility] [place] Office: [phone] Pager:  [phone]

## 2012-05-31 ENCOUNTER — Other Ambulatory Visit: Payer: Self-pay | Admitting: *Deleted

## 2012-05-31 ENCOUNTER — Encounter: Payer: Self-pay | Admitting: *Deleted

## 2012-06-03 ENCOUNTER — Encounter (HOSPITAL_COMMUNITY): Payer: Self-pay | Admitting: Pharmacist

## 2012-06-07 ENCOUNTER — Encounter (HOSPITAL_COMMUNITY): Payer: Self-pay

## 2012-06-07 ENCOUNTER — Encounter (HOSPITAL_COMMUNITY)
Admission: RE | Admit: 2012-06-07 | Discharge: 2012-06-07 | Disposition: A | Discharge status: Home / Self Care | Payer: Medicare Other | Source: Ambulatory Visit | Attending: Vascular Surgery | Admitting: Vascular Surgery

## 2012-06-07 LAB — COMPREHENSIVE METABOLIC PANEL
ALT: 23 U/L (ref 0–35)
AST: 29 U/L (ref 0–37)
Albumin: 3.9 g/dL (ref 3.5–5.2)
CO2: 25 mEq/L (ref 19–32)
Calcium: 9.2 mg/dL (ref 8.4–10.5)
Chloride: 103 mEq/L (ref 96–112)
Creatinine, Ser: 0.69 mg/dL (ref 0.50–1.10)
GFR calc non Af Amer: 89 mL/min — ABNORMAL LOW (ref >90)
Sodium: 139 mEq/L (ref 135–145)
Total Bilirubin: 0.3 mg/dL (ref 0.3–1.2)

## 2012-06-07 LAB — SURGICAL PCR SCREEN: MRSA, PCR: NEGATIVE

## 2012-06-07 LAB — URINALYSIS, ROUTINE W REFLEX MICROSCOPIC
Bilirubin Urine: NEGATIVE
Nitrite: NEGATIVE
Specific Gravity, Urine: 1.006 (ref 1.005–1.030)
Urobilinogen, UA: 1 mg/dL (ref 0.0–1.0)
pH: 6 (ref 5.0–8.0)

## 2012-06-07 LAB — CBC
Platelets: 206 10*3/uL (ref 150–400)
RBC: 5.22 MIL/uL — ABNORMAL HIGH (ref 3.87–5.11)
RDW: 14.7 % (ref 11.5–15.5)
WBC: 8.6 10*3/uL (ref 4.0–10.5)

## 2012-06-07 LAB — PROTIME-INR
INR: 1 (ref 0.00–1.49)
Prothrombin Time: 13.4 seconds (ref 11.6–15.2)

## 2012-06-07 LAB — TYPE AND SCREEN: Antibody Screen: NEGATIVE

## 2012-06-07 NOTE — Consult Note (Signed)
Anesthesia Chart Review:  Patient is a 66 year old female scheduled for revision of a right FPBG on 06/17/12.  History included former smoker, HLD, GERD, hypothyroidism, OA, PAD s/p AFBG and combined bilateral above knee FPBG '10 with failure of her prosthetic right graft s/p thrombectomy and revision 08/2011 and right below knee FPBG on 01/17/12 with SVG.  Dr. Virgel Manifold at [facility] is her PCP.  She reports she just saw him this week.  Her last EKG on 05/29/12 showed NSR, LAE.  The interpreting physician (Dr. Nila Nephew) felt she had marked inferior ST abnormality new from 09/08/11.  I reviewed these EKGs (and 2004 stress test results) with Anesthesiologist.  We did not feel her EKG had significantly changed.  I called to confirm that she was not having any CV symptoms.  She denied history of chest pain and SOB at rest.  She does not have any significant DOE, but has not been quite as active due to PAD and recent RLE procedures.  She denies prior echo or more recent stress test.  In the absence of symptoms, Anesthesiology would not require a repeat EKG.  EKG findings forwarded to [facility].  Will defer any additional orders, if any, to Dr. Arbie Cookey.  Her last stress test was on 07/16/03 and showed: 1. NO EVIDENCE OF STRESS INDUCED ISCHEMIA.  2. SCAR INVOLVING THE APICAL ASPECT OF THE ANTERIOR WALL.  3. EJECTION FRACTION IS 77 PERCENT.  CXR on 09/08/11 showed: 1. No acute cardiopulmonary process.  2. Cardiomegaly and chronic bronchitic markings.    Labs acceptable.  She tolerated a right FPBG in March of this year.  If no new CV symptoms, anticipate she can proceed as planned.  Shonna Chock, PA-C

## 2012-06-07 NOTE — Pre-Procedure Instructions (Addendum)
20 NOHELY [address]ORN  06/07/2012   Your procedure is scheduled on:  06/17/12  Report to [facility] at 530 AM.  Call this number if you have problems the morning of surgery: [phone]   Remember:   Do not eat food or drinl:After Midnight.    Take these medicines the morning of surgery with A SIP OF WATER: xanax, tylenol, levothyroxine.  STOP ibuprofen   Do not wear jewelry, make-up or nail polish.  Do not wear lotions, powders, or perfumes. You may wear deodorant.  Do not shave 48 hours prior to surgery. Men may shave face and neck.  Do not bring valuables to the hospital.  Contacts, dentures or bridgework may not be worn into surgery.  Leave suitcase in the car. After surgery it may be brought to your room.  For patients admitted to the hospital, checkout time is 11:00 AM the day of discharge.   Patients discharged the day of surgery will not be allowed to drive home.  Name and phone number of your driver:rebecca [phone]   Special Instructions: CHG Shower Use Special Wash: 1/2 bottle night before surgery and 1/2 bottle morning of surgery.   Please read over the following fact sheets that you were given: Pain Booklet, Coughing and Deep Breathing, Blood Transfusion Information and MRSA Information

## 2012-06-10 ENCOUNTER — Other Ambulatory Visit: Payer: Self-pay

## 2012-06-14 NOTE — Progress Notes (Addendum)
Left voice message to call us about time change for OR on 06/17/12.  [phone]  Left message on voicemail to arrive at 0730.Marland Kitchenda

## 2012-06-16 MED ORDER — DEXTROSE 5 % IV SOLN
1.5000 g | INTRAVENOUS | Status: AC
  Administered 2012-06-17: 1.5 g via INTRAVENOUS
  Filled 2012-06-16: qty 1.5

## 2012-06-17 ENCOUNTER — Encounter (HOSPITAL_COMMUNITY)
Admission: RE | Disposition: A | Discharge status: Home / Self Care | Payer: Self-pay | Source: Ambulatory Visit | Attending: Vascular Surgery

## 2012-06-17 ENCOUNTER — Ambulatory Visit (HOSPITAL_COMMUNITY): Payer: Medicare Other | Admitting: Vascular Surgery

## 2012-06-17 ENCOUNTER — Encounter (HOSPITAL_COMMUNITY): Payer: Self-pay | Admitting: Vascular Surgery

## 2012-06-17 ENCOUNTER — Inpatient Hospital Stay (HOSPITAL_COMMUNITY)
Admission: RE | Admit: 2012-06-17 | Discharge: 2012-06-19 | DRG: 254 | Disposition: A | Discharge status: Home / Self Care | Payer: Medicare Other | Source: Ambulatory Visit | Attending: Vascular Surgery | Admitting: Vascular Surgery

## 2012-06-17 ENCOUNTER — Encounter (HOSPITAL_COMMUNITY): Payer: Self-pay | Admitting: *Deleted

## 2012-06-17 DIAGNOSIS — Z79899 Other long term (current) drug therapy: Secondary | ICD-10-CM

## 2012-06-17 DIAGNOSIS — E039 Hypothyroidism, unspecified: Secondary | ICD-10-CM | POA: Diagnosis present

## 2012-06-17 DIAGNOSIS — I739 Peripheral vascular disease, unspecified: Secondary | ICD-10-CM

## 2012-06-17 DIAGNOSIS — K219 Gastro-esophageal reflux disease without esophagitis: Secondary | ICD-10-CM | POA: Diagnosis present

## 2012-06-17 DIAGNOSIS — T82898A Other specified complication of vascular prosthetic devices, implants and grafts, initial encounter: Secondary | ICD-10-CM

## 2012-06-17 DIAGNOSIS — I70219 Atherosclerosis of native arteries of extremities with intermittent claudication, unspecified extremity: Secondary | ICD-10-CM | POA: Diagnosis present

## 2012-06-17 DIAGNOSIS — I70409 Unspecified atherosclerosis of autologous vein bypass graft(s) of the extremities, unspecified extremity: Principal | ICD-10-CM | POA: Diagnosis present

## 2012-06-17 DIAGNOSIS — E785 Hyperlipidemia, unspecified: Secondary | ICD-10-CM | POA: Diagnosis present

## 2012-06-17 DIAGNOSIS — M129 Arthropathy, unspecified: Secondary | ICD-10-CM | POA: Diagnosis present

## 2012-06-17 DIAGNOSIS — Z7982 Long term (current) use of aspirin: Secondary | ICD-10-CM

## 2012-06-17 HISTORY — PX: FEMORAL-POPLITEAL BYPASS GRAFT: SHX937

## 2012-06-17 LAB — CBC
HCT: 38.7 % (ref 36.0–46.0)
Hemoglobin: 12.6 g/dL (ref 12.0–15.0)
MCH: 27.8 pg (ref 26.0–34.0)
MCV: 85.2 fL (ref 78.0–100.0)
RBC: 4.54 MIL/uL (ref 3.87–5.11)
WBC: 13.2 10*3/uL — ABNORMAL HIGH (ref 4.0–10.5)

## 2012-06-17 LAB — PROTIME-INR: Prothrombin Time: 14.8 seconds (ref 11.6–15.2)

## 2012-06-17 LAB — APTT: aPTT: 25 seconds (ref 24–37)

## 2012-06-17 LAB — CREATININE, SERUM: GFR calc Af Amer: >90 mL/min (ref >90)

## 2012-06-17 SURGERY — BYPASS GRAFT FEMORAL-POPLITEAL ARTERY
Anesthesia: General | Site: Leg Upper | Laterality: Right | Wound class: Clean

## 2012-06-17 MED ORDER — SIMVASTATIN 40 MG PO TABS
40.0000 mg | ORAL_TABLET | Freq: Every day | ORAL | Status: DC
  Administered 2012-06-17 – 2012-06-18 (×2): 40 mg via ORAL
  Filled 2012-06-17 (×3): qty 1

## 2012-06-17 MED ORDER — ALPRAZOLAM 0.25 MG PO TABS: 0.2500 mg | ORAL_TABLET | Freq: Every evening | ORAL | Status: DC | PRN

## 2012-06-17 MED ORDER — SODIUM CHLORIDE 0.9 % IV SOLN
500.0000 mL | Freq: Once | INTRAVENOUS | Status: AC | PRN
  Administered 2012-06-17: 500 mL via INTRAVENOUS

## 2012-06-17 MED ORDER — ROCURONIUM BROMIDE 100 MG/10ML IV SOLN
INTRAVENOUS | Status: DC | PRN
  Administered 2012-06-17: 40 mg via INTRAVENOUS
  Administered 2012-06-17 (×5): 10 mg via INTRAVENOUS

## 2012-06-17 MED ORDER — METOPROLOL TARTRATE 1 MG/ML IV SOLN: 2.0000 mg | INTRAVENOUS | Status: DC | PRN

## 2012-06-17 MED ORDER — ONDANSETRON HCL 4 MG/2ML IJ SOLN: 4.0000 mg | Freq: Once | INTRAMUSCULAR | Status: DC | PRN

## 2012-06-17 MED ORDER — LIDOCAINE HCL (CARDIAC) 20 MG/ML IV SOLN
INTRAVENOUS | Status: DC | PRN
  Administered 2012-06-17: 60 mg via INTRAVENOUS

## 2012-06-17 MED ORDER — SODIUM CHLORIDE 0.9 % IV SOLN: INTRAVENOUS | Status: DC

## 2012-06-17 MED ORDER — SODIUM CHLORIDE 0.9 % IV SOLN
INTRAVENOUS | Status: DC
  Administered 2012-06-17 – 2012-06-18 (×2): via INTRAVENOUS

## 2012-06-17 MED ORDER — LACTATED RINGERS IV SOLN
INTRAVENOUS | Status: DC | PRN
  Administered 2012-06-17 (×4): via INTRAVENOUS

## 2012-06-17 MED ORDER — POTASSIUM CHLORIDE CRYS ER 20 MEQ PO TBCR: 20.0000 meq | EXTENDED_RELEASE_TABLET | Freq: Once | ORAL | Status: AC | PRN

## 2012-06-17 MED ORDER — HYDROMORPHONE HCL PF 1 MG/ML IJ SOLN: 0.5000 mg | INTRAMUSCULAR | Status: DC | PRN

## 2012-06-17 MED ORDER — GUAIFENESIN-DM 100-10 MG/5ML PO SYRP: 15.0000 mL | ORAL_SOLUTION | ORAL | Status: DC | PRN

## 2012-06-17 MED ORDER — ENOXAPARIN SODIUM 40 MG/0.4ML ~~LOC~~ SOLN: 40.0000 mg | SUBCUTANEOUS | Status: DC

## 2012-06-17 MED ORDER — ONDANSETRON HCL 4 MG/2ML IJ SOLN
4.0000 mg | Freq: Four times a day (QID) | INTRAMUSCULAR | Status: DC | PRN
  Administered 2012-06-17 – 2012-06-18 (×2): 4 mg via INTRAVENOUS
  Filled 2012-06-17 (×3): qty 2

## 2012-06-17 MED ORDER — DEXTROSE 5 % IV SOLN
1.5000 g | Freq: Two times a day (BID) | INTRAVENOUS | Status: AC
  Administered 2012-06-17 – 2012-06-18 (×2): 1.5 g via INTRAVENOUS
  Filled 2012-06-17 (×2): qty 1.5

## 2012-06-17 MED ORDER — SODIUM CHLORIDE 0.9 % IR SOLN
Status: DC | PRN
  Administered 2012-06-17: 10:00:00

## 2012-06-17 MED ORDER — PROTAMINE SULFATE 10 MG/ML IV SOLN
INTRAVENOUS | Status: DC | PRN
  Administered 2012-06-17: 10 mg via INTRAVENOUS
  Administered 2012-06-17: 40 mg via INTRAVENOUS

## 2012-06-17 MED ORDER — ENOXAPARIN SODIUM 40 MG/0.4ML ~~LOC~~ SOLN
40.0000 mg | SUBCUTANEOUS | Status: DC
  Filled 2012-06-17 (×2): qty 0.4

## 2012-06-17 MED ORDER — DOCUSATE SODIUM 100 MG PO CAPS
100.0000 mg | ORAL_CAPSULE | Freq: Every day | ORAL | Status: DC
  Administered 2012-06-18: 100 mg via ORAL
  Filled 2012-06-17 (×2): qty 1

## 2012-06-17 MED ORDER — HYDRALAZINE HCL 20 MG/ML IJ SOLN
10.0000 mg | INTRAMUSCULAR | Status: DC | PRN
  Filled 2012-06-17: qty 0.5

## 2012-06-17 MED ORDER — ACETAMINOPHEN 325 MG PO TABS
325.0000 mg | ORAL_TABLET | ORAL | Status: DC | PRN
  Administered 2012-06-19: 650 mg via ORAL
  Filled 2012-06-17: qty 2

## 2012-06-17 MED ORDER — PHENYLEPHRINE HCL 10 MG/ML IJ SOLN
INTRAMUSCULAR | Status: DC | PRN
  Administered 2012-06-17 (×2): 40 ug via INTRAVENOUS
  Administered 2012-06-17 (×4): 80 ug via INTRAVENOUS

## 2012-06-17 MED ORDER — PHENOL 1.4 % MT LIQD: 1.0000 | OROMUCOSAL | Status: DC | PRN

## 2012-06-17 MED ORDER — NEOSTIGMINE METHYLSULFATE 1 MG/ML IJ SOLN
INTRAMUSCULAR | Status: DC | PRN
  Administered 2012-06-17: 5 mg via INTRAVENOUS

## 2012-06-17 MED ORDER — LABETALOL HCL 5 MG/ML IV SOLN
10.0000 mg | INTRAVENOUS | Status: DC | PRN
  Filled 2012-06-17: qty 4

## 2012-06-17 MED ORDER — ONDANSETRON HCL 4 MG/2ML IJ SOLN
INTRAMUSCULAR | Status: DC | PRN
  Administered 2012-06-17: 4 mg via INTRAVENOUS

## 2012-06-17 MED ORDER — HYDROMORPHONE HCL PF 1 MG/ML IJ SOLN
INTRAMUSCULAR | Status: AC
  Filled 2012-06-17: qty 1

## 2012-06-17 MED ORDER — HYDROMORPHONE HCL PF 1 MG/ML IJ SOLN
0.2500 mg | INTRAMUSCULAR | Status: DC | PRN
  Administered 2012-06-17: 0.5 mg via INTRAVENOUS

## 2012-06-17 MED ORDER — LEVOTHYROXINE SODIUM 112 MCG PO TABS
112.0000 ug | ORAL_TABLET | Freq: Every day | ORAL | Status: DC
  Administered 2012-06-18: 112 ug via ORAL
  Filled 2012-06-17 (×2): qty 1

## 2012-06-17 MED ORDER — GLYCOPYRROLATE 0.2 MG/ML IJ SOLN
INTRAMUSCULAR | Status: DC | PRN
  Administered 2012-06-17: .8 mg via INTRAVENOUS

## 2012-06-17 MED ORDER — FENTANYL CITRATE 0.05 MG/ML IJ SOLN
INTRAMUSCULAR | Status: DC | PRN
  Administered 2012-06-17 (×2): 50 ug via INTRAVENOUS
  Administered 2012-06-17: 25 ug via INTRAVENOUS
  Administered 2012-06-17: 75 ug via INTRAVENOUS
  Administered 2012-06-17: 100 ug via INTRAVENOUS
  Administered 2012-06-17: 25 ug via INTRAVENOUS
  Administered 2012-06-17: 50 ug via INTRAVENOUS

## 2012-06-17 MED ORDER — OXYCODONE-ACETAMINOPHEN 5-325 MG PO TABS
1.0000 | ORAL_TABLET | ORAL | Status: DC | PRN
  Administered 2012-06-17 – 2012-06-19 (×10): 1 via ORAL
  Filled 2012-06-17 (×10): qty 1

## 2012-06-17 MED ORDER — ASPIRIN EC 81 MG PO TBEC
81.0000 mg | DELAYED_RELEASE_TABLET | Freq: Every day | ORAL | Status: DC
  Administered 2012-06-17 – 2012-06-18 (×2): 81 mg via ORAL
  Filled 2012-06-17 (×3): qty 1

## 2012-06-17 MED ORDER — ACETAMINOPHEN 650 MG RE SUPP
325.0000 mg | RECTAL | Status: DC | PRN
Start: 2012-06-17 — End: 2012-06-19

## 2012-06-17 MED ORDER — PROPOFOL 10 MG/ML IV EMUL
INTRAVENOUS | Status: DC | PRN
  Administered 2012-06-17: 150 mg via INTRAVENOUS
  Administered 2012-06-17: 50 mg via INTRAVENOUS

## 2012-06-17 MED ORDER — 0.9 % SODIUM CHLORIDE (POUR BTL) OPTIME
TOPICAL | Status: DC | PRN
  Administered 2012-06-17: 2000 mL

## 2012-06-17 MED ORDER — MIDAZOLAM HCL 5 MG/5ML IJ SOLN
INTRAMUSCULAR | Status: DC | PRN
  Administered 2012-06-17 (×2): 1 mg via INTRAVENOUS

## 2012-06-17 MED ORDER — HEPARIN SODIUM (PORCINE) 1000 UNIT/ML IJ SOLN
INTRAMUSCULAR | Status: DC | PRN
  Administered 2012-06-17: 6 mL via INTRAVENOUS

## 2012-06-17 SURGICAL SUPPLY — 66 items
APL SKNCLS STERI-STRIP NONHPOA (GAUZE/BANDAGES/DRESSINGS) ×1
BANDAGE ESMARK 6X9 LF (GAUZE/BANDAGES/DRESSINGS) IMPLANT
BENZOIN TINCTURE PRP APPL 2/3 (GAUZE/BANDAGES/DRESSINGS) ×2 IMPLANT
BNDG CMPR 9X6 STRL LF SNTH (GAUZE/BANDAGES/DRESSINGS) ×1
BNDG ESMARK 6X9 LF (GAUZE/BANDAGES/DRESSINGS) ×2
CANISTER SUCTION 2500CC (MISCELLANEOUS) ×2 IMPLANT
CANNULA VESSEL W/WING WO/VALVE (CANNULA) ×2 IMPLANT
CATH EMB 4FR 40CM (CATHETERS) ×1 IMPLANT
CLIP LIGATING EXTRA MED SLVR (CLIP) ×2 IMPLANT
CLIP LIGATING EXTRA SM BLUE (MISCELLANEOUS) ×2 IMPLANT
CLOSURE STERI-STRIP 1/4X4 (GAUZE/BANDAGES/DRESSINGS) ×2 IMPLANT
CLOTH BEACON ORANGE TIMEOUT ST (SAFETY) ×2 IMPLANT
COVER PROBE W GEL 5X96 (DRAPES) ×1 IMPLANT
COVER SURGICAL LIGHT HANDLE (MISCELLANEOUS) ×2 IMPLANT
CUFF TOURNIQUET SINGLE 24IN (TOURNIQUET CUFF) ×1 IMPLANT
CUFF TOURNIQUET SINGLE 34IN LL (TOURNIQUET CUFF) IMPLANT
CUFF TOURNIQUET SINGLE 44IN (TOURNIQUET CUFF) IMPLANT
DRAIN SNY 10X20 3/4 PERF (WOUND CARE) IMPLANT
DRAPE WARM FLUID 44X44 (DRAPE) ×2 IMPLANT
DRAPE X-RAY CASS 24X20 (DRAPES) IMPLANT
DRSG COVADERM 4X10 (GAUZE/BANDAGES/DRESSINGS) IMPLANT
DRSG COVADERM 4X6 (GAUZE/BANDAGES/DRESSINGS) ×1 IMPLANT
DRSG COVADERM 4X8 (GAUZE/BANDAGES/DRESSINGS) ×1 IMPLANT
ELECT REM PT RETURN 9FT ADLT (ELECTROSURGICAL) ×2
ELECTRODE REM PT RTRN 9FT ADLT (ELECTROSURGICAL) ×1 IMPLANT
EVACUATOR SILICONE 100CC (DRAIN) IMPLANT
GLOVE BIO SURGEON STRL SZ 6.5 (GLOVE) ×4 IMPLANT
GLOVE BIO SURGEON STRL SZ7 (GLOVE) ×1 IMPLANT
GLOVE BIOGEL PI IND STRL 6.5 (GLOVE) IMPLANT
GLOVE BIOGEL PI INDICATOR 6.5 (GLOVE) ×2
GLOVE ECLIPSE 6.0 STRL STRAW (GLOVE) ×1 IMPLANT
GLOVE SS BIOGEL STRL SZ 7.5 (GLOVE) ×1 IMPLANT
GLOVE SUPERSENSE BIOGEL SZ 7.5 (GLOVE) ×2
GLOVE SURG SS PI 6.5 STRL IVOR (GLOVE) ×3 IMPLANT
GOWN STRL NON-REIN LRG LVL3 (GOWN DISPOSABLE) ×9 IMPLANT
INSERT FOGARTY SM (MISCELLANEOUS) IMPLANT
KIT BASIN OR (CUSTOM PROCEDURE TRAY) ×2 IMPLANT
KIT ROOM TURNOVER OR (KITS) ×2 IMPLANT
NS IRRIG 1000ML POUR BTL (IV SOLUTION) ×4 IMPLANT
PACK PERIPHERAL VASCULAR (CUSTOM PROCEDURE TRAY) ×2 IMPLANT
PAD ARMBOARD 7.5X6 YLW CONV (MISCELLANEOUS) ×2 IMPLANT
PADDING CAST COTTON 6X4 STRL (CAST SUPPLIES) ×1 IMPLANT
PATCH HEMASHIELD 8X75 (Vascular Products) ×1 IMPLANT
SET COLLECT BLD 21X3/4 12 (NEEDLE) IMPLANT
SPONGE GAUZE 4X4 12PLY (GAUZE/BANDAGES/DRESSINGS) ×2 IMPLANT
SPONGE LAP 18X18 X RAY DECT (DISPOSABLE) ×1 IMPLANT
SPONGE TONSIL 1.25 RF SGL STRG (GAUZE/BANDAGES/DRESSINGS) ×1 IMPLANT
STAPLER VISISTAT 35W (STAPLE) IMPLANT
STOPCOCK 4 WAY LG BORE MALE ST (IV SETS) ×1 IMPLANT
STRIP CLOSURE SKIN 1/2X4 (GAUZE/BANDAGES/DRESSINGS) ×2 IMPLANT
SUT ETHILON 3 0 PS 1 (SUTURE) IMPLANT
SUT PROLENE 5 0 C 1 24 (SUTURE) ×3 IMPLANT
SUT PROLENE 6 0 CC (SUTURE) ×3 IMPLANT
SUT SILK 2 0 SH (SUTURE) ×2 IMPLANT
SUT VIC AB 2-0 CTX 36 (SUTURE) ×5 IMPLANT
SUT VIC AB 3-0 SH 27 (SUTURE) ×6
SUT VIC AB 3-0 SH 27X BRD (SUTURE) ×2 IMPLANT
SUT VIC AB 4-0 PS2 27 (SUTURE) ×1 IMPLANT
SYR 3ML LL SCALE MARK (SYRINGE) ×1 IMPLANT
SYR 5ML LL (SYRINGE) ×1 IMPLANT
TOWEL OR 17X24 6PK STRL BLUE (TOWEL DISPOSABLE) ×4 IMPLANT
TOWEL OR 17X26 10 PK STRL BLUE (TOWEL DISPOSABLE) ×4 IMPLANT
TRAY FOLEY CATH 14FRSI W/METER (CATHETERS) ×2 IMPLANT
TUBING EXTENTION W/L.L. (IV SETS) IMPLANT
UNDERPAD 30X30 INCONTINENT (UNDERPADS AND DIAPERS) ×2 IMPLANT
WATER STERILE IRR 1000ML POUR (IV SOLUTION) ×2 IMPLANT

## 2012-06-17 NOTE — Anesthesia Procedure Notes (Signed)
Procedure Name: Intubation Date/Time: 06/17/2012 9:15 AM Performed by: Sherie Don Pre-anesthesia Checklist: Patient identified, Emergency Drugs available, Suction available, Patient being monitored and Timeout performed Patient Re-evaluated:Patient Re-evaluated prior to inductionOxygen Delivery Method: Circle system utilized Preoxygenation: Pre-oxygenation with 100% oxygen Intubation Type: IV induction Ventilation: Mask ventilation without difficulty and Oral airway inserted - appropriate to patient size Laryngoscope Size: Mac and 3 Grade View: Grade I Tube type: Oral Tube size: 7.0 mm Number of attempts: 1 Airway Equipment and Method: Stylet Placement Confirmation: positive ETCO2,  breath sounds checked- equal and bilateral and ETT inserted through vocal cords under direct vision Secured at: 22 cm Tube secured with: Tape Dental Injury: Teeth and Oropharynx as per pre-operative assessment

## 2012-06-17 NOTE — Preoperative (Signed)
Beta Blockers   Reason not to administer Beta Blockers:Not Applicable 

## 2012-06-17 NOTE — Progress Notes (Signed)
Dr. Noreene Larsson said he would sign pt out.

## 2012-06-17 NOTE — Anesthesia Preprocedure Evaluation (Addendum)
Anesthesia Evaluation  Patient identified by MRN, date of birth, ID band Patient awake    Reviewed: Allergy & Precautions, H&P , NPO status , Patient's Chart, lab work & pertinent test results  Airway Mallampati: II TM Distance: >3 FB     Dental  (+) Edentulous Upper and Dental Advisory Given   Pulmonary  breath sounds clear to auscultation        Cardiovascular + Peripheral Vascular Disease Rhythm:Regular Rate:Normal     Neuro/Psych    GI/Hepatic GERD-  Controlled,  Endo/Other  Hypothyroidism   Renal/GU      Musculoskeletal   Abdominal   Peds  Hematology   Anesthesia Other Findings   Reproductive/Obstetrics                         Anesthesia Physical Anesthesia Plan  ASA: III  Anesthesia Plan: General   Post-op Pain Management:    Induction: Intravenous  Airway Management Planned: Oral ETT  Additional Equipment:   Intra-op Plan:   Post-operative Plan: Extubation in OR  Informed Consent: I have reviewed the patients History and Physical, chart, labs and discussed the procedure including the risks, benefits and alternatives for the proposed anesthesia with the patient or authorized representative who has indicated his/her understanding and acceptance.   Dental advisory given  Plan Discussed with: CRNA, Anesthesiologist and Surgeon  Anesthesia Plan Comments: (Plan GA   Kipp Brood, MD)       Anesthesia Quick Evaluation

## 2012-06-17 NOTE — Progress Notes (Signed)
Dr. Arbie Cookey by to check status of pt. dsg shows new drainage, SBP 80-100 range. Will continue to monitor. All other VSS.  Pulses present and palpable.

## 2012-06-17 NOTE — Anesthesia Postprocedure Evaluation (Signed)
  Anesthesia Post-op Note  Patient: [name] [name]  Procedure(s) Performed: Procedure(s) (LRB): BYPASS GRAFT FEMORAL-POPLITEAL ARTERY (Right)  Patient Location: PACU  Anesthesia Type: General  Level of Consciousness: awake, alert  and oriented  Airway and Oxygen Therapy: Patient Spontanous Breathing and Patient connected to nasal cannula oxygen  Post-op Pain: mild  Post-op Assessment: Post-op Vital signs reviewed and Patient's Cardiovascular Status Stable  Post-op Vital Signs: stable  Complications: No apparent anesthesia complications

## 2012-06-17 NOTE — H&P (Signed)
[name] [name]   05/14/2012 4:00 PM Office Visit  MRN: [id number]   Description: 66 year old female  Provider: Cortnee Steinmiller, MD  Department: [facility]        Diagnoses     Atherosclerosis of native arteries of the extremities with intermittent claudication   - Primary    440.21      Reason for Visit     PVD    3 month f/u        Reason For Visit History Recorded        Vitals - Last Recorded       BP Pulse Temp Ht Wt BMI    146/42 71 98.3 F (36.8 C) (Oral) 5\' 1"  (1.549 m) 126 lb (57.153 kg) 23.81 kg/m2         SpO2              100%                 Progress Notes     Guadalupe Nickless, MD  05/14/2012  5:06 PM  Signed The patient has today for followup of her lower surety arterial insufficiency. She is status post aortobifemoral and combined bilateral femoral-popliteal bypasses in 2010 for critical limb ischemia and tissue loss bilaterally. She's had no difficulty with her aortofemoral graft or her left femoropopliteal bypass. She has had failure of her prosthetic right graft and replacement in March of the vein femoropopliteal in 2013. She does report some mild calf claudication symptoms on the right. She's had no difficulty on the left. She's had no tissue loss.    Past Medical History   Diagnosis  Date   .  Peripheral arterial disease     .  Hyperlipidemia     .  GERD (gastroesophageal reflux disease)     .  Thyroid disease         Hypothyroidism   .  Hypothyroidism     .  Blood transfusion         2 /12 yrs ago   .  Arthritis     .  PONV (postoperative nausea and vomiting)         N/v with morphine   .  Hypertension         stress test about 10 yrs ago-turned out it was thyroid disease       History   Substance Use Topics   .  Smoking status:  Former Smoker       Quit date:  09/06/2001   .  Smokeless tobacco:  Not on file   .  Alcohol Use:  4.2 oz/week       7 Glasses of wine per week         wine       Family History   Problem  Relation  Age  of Onset   .  Anesthesia problems  Neg Hx     .  Hypotension  Neg Hx     .  Malignant hyperthermia  Neg Hx     .  Pseudochol deficiency  Neg Hx         Allergies   Allergen  Reactions   .  Morphine And Related  Nausea And Vomiting      Current outpatient prescriptions:ALPRAZolam (XANAX) 0.25 MG tablet, Take 0.25 mg by mouth at bedtime as needed. For anxiety , Disp: , Rfl: ;  aspirin 81 MG tablet, Take 81 mg by mouth daily.  ,  Disp: , Rfl: ;  Calcium Carbonate-Vitamin D (CALCIUM + D PO), Take 1 tablet by mouth 2 (two) times daily., Disp: , Rfl: ;  Cholecalciferol (VITAMIN D) 2000 UNITS CAPS, Take 1 capsule by mouth daily., Disp: , Rfl:   levothyroxine (SYNTHROID, LEVOTHROID) 112 MCG tablet, Take 112 mcg by mouth daily.  , Disp: , Rfl: ;  simvastatin (ZOCOR) 40 MG tablet, Take 40 mg by mouth at bedtime.  , Disp: , Rfl: ;  HYDROcodone-acetaminophen (VICODIN) 5-500 MG per tablet, Take 1-2 tabs q 6 hrs prn/pain, Disp: 20 tablet, Rfl: 0   BP 146/42  Pulse 71  Temp 98.3 F (36.8 C) (Oral)  Ht 5\' 1"  (1.549 m)  Wt 126 lb (57.153 kg)  BMI 23.81 kg/m2  SpO2 100%   Body mass index is 23.81 kg/(m^2).             Physical exam well-developed well-nourished white female appearing stated age in no acute distress. She does have 2+ femoral pulses bilaterally she has 1+ right dorsalis pedis and 2+ left dorsalis pedis pulse. Her incisions are all healed well.   Noninvasive vascular laboratory studies revealed drop in her right ankle arm index to 0.70 down from 0.95. She does have scarring of the anastomosis but does appear to have elevated velocities as well   Impression and plan drop in ankle arm index and also evidence of stenosis by duplex of her right femoropopliteal bypass. I have recommended arteriography for further evaluation. I explained I would not intervene on the basis of her symptoms of mild claudication concerned about occlusion of her femoropopliteal bypass. She understands  and we'll schedule this as an outpatient on 05/22/2012         Not recorded              Level of Service     PR OFFICE OUTPATIENT VISIT 15 MINUTES [99213]      Follow-up and Disposition     Routing History Recorded        All Flowsheet Templates (all recorded)     Encounter Vitals Flowsheet    Custom Formula Data Flowsheet    Anthropometrics Flowsheet               Referring Provider          Ravisankar Sherlyn Lees, MD       All Charges for This Encounter       Code Description Service Date Service Provider Modifiers Quantity    [phone] PR OFFICE OUTPATIENT VISIT 15 MINUTES 05/14/2012 Larina Earthly, MD   1        Other Encounter Related Information     Allergies & Medications         Problem List         History         Patient-Entered Questionnaires   Printed AVS Reports     No AVS reports have been printed for this encounter.        No data filed    Addendum:  The patient has been re-examined and re-evaluated.  The patient's history and physical has been reviewed and is unchanged.    [name] [name] is a 66 y.o. female is being admitted with PVD. All the risks, benefits and other treatment options have been discussed with the patient. The patient has consented to proceed with Procedure(s): BYPASS GRAFT FEMORAL-POPLITEAL ARTERY as a surgical intervention.  Jlen Wintle 06/17/2012 8:50 AM [facility]

## 2012-06-17 NOTE — Transfer of Care (Signed)
Immediate Anesthesia Transfer of Care Note  Patient: [name] [name]  Procedure(s) Performed: Procedure(s) (LRB): BYPASS GRAFT FEMORAL-POPLITEAL ARTERY (Right)  Patient Location: PACU  Anesthesia Type: General  LOC: sedated cooperative good airway  Airway & Oxygen Therapy: Patient Spontanous Breathing and Patient connected to face mask oxygen  Post-op Assessment: Report given to PACU RN and Post -op Vital signs reviewed and stable  Post vital signs: Reviewed and stable  Complications: No apparent anesthesia complications

## 2012-06-17 NOTE — Progress Notes (Signed)
Utilization review completed.  

## 2012-06-17 NOTE — Progress Notes (Signed)
Call to dr. Arbie Cookey to report blood dripping from dressing at knee level. New orders received.

## 2012-06-17 NOTE — Progress Notes (Signed)
Dr. Arbie Cookey at bedside.  He said to continue to monitor and reinforce existing dressings.

## 2012-06-17 NOTE — Op Note (Signed)
OPERATIVE REPORT  DATE OF SURGERY: 06/17/2012  PATIENT: [name] [name], 66 y.o. 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  PHYSICIAN ASSISTANT: Roczniak  ANESTHESIA:  Gen.  EBL: 100 ml  Total I/O In: 3000 [I.V.:3000] Out: 450 [Urine:200; Blood:250]  BLOOD ADMINISTERED: None  DRAINS: None  SPECIMEN: None  COUNTS CORRECT:  YES  PLAN OF CARE: PACU   PATIENT DISPOSITION:  PACU - hemodynamically stable  PROCEDURE DETAILS: The patient is status post aorto bifemoral and bilateral Gore-Tex femoropopliteal bypass is a single procedure in 2010. She had developed an occlusion of her right femoropopliteal bypass subsequent to this event and underwent replacement with a vein femoropopliteal bypass. On surveillance she was found to have elevated velocities at the proximal and distal anastomosis. She underwent arteriography to firming a significant stenosis at the takeoff of the femoropopliteal from the anastomosis with aortofemoral bypass on the right and also severe stenosis at the vein graft stenosis at the distal anastomosis of the above-knee popliteal position. It was recommended she undergo revision for improvement of her claudication symptoms and also with concerns of increased risk for graft occlusion   The patient was placed in the supine position and the area the right groin right leg and right foot were prepped and draped in usual sterile fashion. Prior incisions were marked. The groin incision was reopened with a skin incision through the old scar. This was carried down to isolate the right limb of the aortofemoral graft and the takeoff of the right femoropopliteal  bypass with vein. Its arteriogram in addition there was a stenosis right at the level of the hood of the proximal anastomosis. The profundus femoris artery was also encircled with a vessel loop. The there was extensive scarring and the common femoral artery was not exposed to do to being below the level of aortofemoral graft up under the inguinal ligament.  Next the above-knee popliteal incision was reopened and the medial approach and carried down to isolate the vein graft at the above-knee popliteal artery. The artery was thickened. The ultrasound was used to mark the saphenous vein in the midcalf incision was made over this. The vein was relatively small in diameter but was patent. This was occluded proximal and distally and was harvested. It was gently dilated and was felt to be adequate size for a patch at the popliteal anastomosis. Due to the size mismatch proximal incision was made to place a prosthetic Dacron patch from the Dacron limb of the aortofemoral graft onto the hood of the vein femoropopliteal. Patient was given 6 units heparin circulation time be right limb of the aortofemoral graft was occluded with a Hanley clamp the saphenous vein was occluded with a serrefine clamp and the profundus femoris artery was occluded with a baby Gregory clamp. The hood of the vein graft was opened there was backbleeding from the common femoral vein proximally and this was controlled with digital pressure. The incision was continued across the stenosis onto the hood of the aortofemoral Dacron graft. A Finesse Hemashield patch brought onto the field and was sewn as a patch angioplasty with a running 5-0 Prolene suture.  Completion anastomosis the usual flushing maneuvers were taken anastomosis was completed. Clamps removed and good anastomosis was encountered. Next  a 24 inch pneumatic tourniquet was placed in the thigh position. Webb roll was placed first. The leg was elevated and exsanguinated with an Esmarch  tourniquet and the pneumatic tourniquet was inflated to 30 mm of mercury. The vein anastomosis was again exposed the above-knee popliteal position. The vein was opened 11 blade and sent longitudinally Potts scissors across the anastomosis onto the popliteal artery. Optical artery had significant plaque in thickening but had a widely patent lumen at this position. There was a tight stenosis and intimal hyperplasia at the hood of this. This was endarterectomized. The vein was brought onto the field was opened longitudinally and was sewn as a patch angioplasty with a running 6-0 Prolene suture. Prior to completion of the anastomosis the tourniquet was deflated in the usual flushing maneuvers were undertaken. Anastomosis completed and excellent Doppler flow was noted across the anastomosis distally and in the foot. This was graft-dependent Doppler flow in the foot. The patient was given 50 mg of protamine to reverse heparin. Wound irrigated with saline. Wounds were closed with 2-0 Vicryl in the fascia in both the groin and the medial approach the popliteal artery. Skin was closed with 304 0 Vicryl subcuticular sutures. Sterile dressing was applied and the patient taken to the recovery in stable condition.   Gretta Began, M.D. 06/17/2012 1:18 PM

## 2012-06-18 ENCOUNTER — Encounter (HOSPITAL_COMMUNITY): Payer: Self-pay | Admitting: Vascular Surgery

## 2012-06-18 LAB — BASIC METABOLIC PANEL
CO2: 24 mEq/L (ref 19–32)
Calcium: 7.7 mg/dL — ABNORMAL LOW (ref 8.4–10.5)
Chloride: 104 mEq/L (ref 96–112)
GFR calc Af Amer: >90 mL/min (ref >90)
Sodium: 136 mEq/L (ref 135–145)

## 2012-06-18 LAB — CBC
MCH: 27.9 pg (ref 26.0–34.0)
Platelets: 109 10*3/uL — ABNORMAL LOW (ref 150–400)
RBC: 3.59 MIL/uL — ABNORMAL LOW (ref 3.87–5.11)
RDW: 15 % (ref 11.5–15.5)
WBC: 9.6 10*3/uL (ref 4.0–10.5)

## 2012-06-18 NOTE — Progress Notes (Signed)
Pt ambulated in hallway with rolling walker, standby assist. Pt tolerated ambulation well. Dion Saucier

## 2012-06-18 NOTE — Progress Notes (Signed)
Subjective: Interval History: none.. moderate soreness at popliteal incision. Minimal at groin  Objective: Vital signs in last 24 hours: Temp:  [97.6 F (36.4 C)-98.1 F (36.7 C)] 98.1 F (36.7 C) (08/20 0325) Pulse Rate:  [53-71] 56  (08/20 0325) Resp:  [10-21] 13  (08/20 0325) BP: (85-164)/(42-83) 90/42 mmHg (08/20 0325) SpO2:  [93 %-99 %] 97 % (08/20 0325) Weight:  [134 lb 0.6 oz (60.8 kg)] 134 lb 0.6 oz (60.8 kg) (08/19 1556)  Intake/Output from previous day: 08/19 0701 - 08/20 0700 In: 6061.7 [I.V.:6011.7; IV Piggyback:50] Out: 2525 [Urine:2275; Blood:250] Intake/Output this shift:    Physical exam: Seems intact over her groin and popliteal area. No ongoing bleeding. 2+ dorsalis pedis pulse.  Lab Results:  Basename 06/18/12 0340 06/17/12 1424  WBC 9.6 13.2*  HGB 10.0* 12.6  HCT 30.7* 38.7  PLT 109* 125*   BMET  Basename 06/18/12 0340 06/17/12 1424  NA 136 --  K 3.8 --  CL 104 --  CO2 24 --  GLUCOSE 95 --  BUN 9 --  CREATININE 0.69 0.71  CALCIUM 7.7* --    Studies/Results: No results found. Anti-infectives: Anti-infectives     Start     Dose/Rate Route Frequency Ordered Stop   06/17/12 2100   cefUROXime (ZINACEF) 1.5 g in dextrose 5 % 50 mL IVPB        1.5 g 100 mL/hr over 30 Minutes Intravenous Every 12 hours 06/17/12 1558 06/18/12 2059   06/16/12 1352   cefUROXime (ZINACEF) 1.5 g in dextrose 5 % 50 mL IVPB        1.5 g 100 mL/hr over 30 Minutes Intravenous 30 min pre-op 06/16/12 1352 06/17/12 0930          Assessment/Plan: s/p Procedure(s) (LRB): BYPASS GRAFT FEMORAL-POPLITEAL ARTERY (Right) Doing well overall. Plan to move to unit 2000 today. Discontinue dressings. Mobilize and probable DC in the morning   LOS: 1 day   Howie Rufus 06/18/2012, 7:26 AM

## 2012-06-18 NOTE — Evaluation (Signed)
Physical Therapy Evaluation Patient Details Name: [name] [name] MRN: [id number] DOB: 06/28/1946 Today's Date: 06/18/2012 Time: 0902-0919 PT Time Calculation (min): 17 min  PT Assessment / Plan / Recommendation Clinical Impression  pt presents with R Fem-pop BPG.  pt notes this being the 3rd BPG she has had and has all needed DME at home.  pt moving well and anticipate good progress.  Will follow acutely.      PT Assessment  Patient needs continued PT services    Follow Up Recommendations  No PT follow up    Barriers to Discharge None      Equipment Recommendations  None recommended by PT    Recommendations for Other Services     Frequency Min 3X/week    Precautions / Restrictions Precautions Precautions: None Restrictions Weight Bearing Restrictions: No   Pertinent Vitals/Pain 6-7/10.  RN aware and medicating.        Mobility  Bed Mobility Bed Mobility: Not assessed Transfers Transfers: Sit to Stand;Stand to Sit Sit to Stand: 5: Supervision;With upper extremity assist;From chair/3-in-1 Stand to Sit: 5: Supervision;With upper extremity assist (to W/C to transport to 2000) Details for Transfer Assistance: pt moves slowly, but demos good technique Ambulation/Gait Ambulation/Gait Assistance: 5: Supervision Ambulation Distance (Feet): 200 Feet Assistive device: Rolling walker Ambulation/Gait Assistance Details: Antalgic, but good sequencing and use of RW.   Gait Pattern: Step-to pattern;Decreased step length - left;Decreased stance time - right Stairs: No Wheelchair Mobility Wheelchair Mobility: No    Exercises     PT Diagnosis: Difficulty walking;Acute pain  PT Problem List: Decreased strength;Decreased activity tolerance;Decreased balance;Decreased mobility;Decreased knowledge of use of DME;Pain PT Treatment Interventions: DME instruction;Gait training;Stair training;Functional mobility training;Therapeutic activities;Therapeutic exercise;Balance  training;Patient/family education   PT Goals Acute Rehab PT Goals PT Goal Formulation: With patient Time For Goal Achievement: 06/25/12 Potential to Achieve Goals: Good Pt will go Supine/Side to Sit: with modified independence PT Goal: Supine/Side to Sit - Progress: Goal set today Pt will go Sit to Supine/Side: with modified independence PT Goal: Sit to Supine/Side - Progress: Goal set today Pt will go Sit to Stand: with modified independence PT Goal: Sit to Stand - Progress: Goal set today Pt will go Stand to Sit: with modified independence PT Goal: Stand to Sit - Progress: Goal set today Pt will Ambulate: >150 feet;with modified independence;with rolling walker PT Goal: Ambulate - Progress: Goal set today Pt will Go Up / Down Stairs: 1-2 stairs;with min assist;with least restrictive assistive device PT Goal: Up/Down Stairs - Progress: Goal set today  Visit Information  Last PT Received On: 06/18/12 Assistance Needed: +1 PT/OT Co-Evaluation/Treatment: Yes    Subjective Data  Subjective: This one's nothing compared to the other one.   Patient Stated Goal: Home   Prior Functioning  Home Living Lives With: Alone Available Help at Discharge: Family;Available 24 hours/day Type of Home: House Home Access: Stairs to enter [employer] of Steps: 2 Entrance Stairs-Rails: None Home Layout: One level Bathroom Shower/Tub: Walk-in shower;Door [employer] Toilet: Handicapped height Bathroom Accessibility: Yes How Accessible: Accessible via walker Home Adaptive Equipment: Dan Humphreys - four wheeled;Shower chair with back Prior Function Level of Independence: Independent Able to Take Stairs?: Yes Driving: Yes Vocation: Part time employment (Semi-retired works 2-3 days/wk for [employer]) Communication Communication: No difficulties    Cognition  Overall Cognitive Status: Appears within functional limits for tasks assessed/performed Arousal/Alertness:  Awake/alert Orientation Level: Oriented X4 / Intact Behavior During Session: [facility] for tasks performed    Extremity/Trunk Assessment Right  Lower Extremity Assessment RLE ROM/Strength/Tone: Deficits RLE ROM/Strength/Tone Deficits: Limited by pain and edema RLE Sensation: Deficits RLE Sensation Deficits: Diminished to soft touch.   Left Lower Extremity Assessment LLE ROM/Strength/Tone: WFL for tasks assessed LLE Sensation: Deficits LLE Sensation Deficits: Diminished to soft touch Trunk Assessment Trunk Assessment: Normal   Balance Balance Balance Assessed: No  End of Session PT - End of Session Equipment Utilized During Treatment: Gait belt Activity Tolerance: Patient tolerated treatment well Patient left:  (in W/C with Nsg to transport to 2000) Nurse Communication: Mobility status  GP     RitenourAlison Murray, PT [phone] 06/18/2012, 10:01 AM

## 2012-06-18 NOTE — Evaluation (Signed)
Occupational Therapy Evaluation Patient Details Name: [name] [name] MRN: [id number] DOB: 23-Mar-1946 Today's Date: 06/18/2012 Time: 6962-9528 OT Time Calculation (min): 13 min  OT Assessment / Plan / Recommendation Clinical Impression  66 yo female s/p R Fem-pop BPG.  pt notes this being the 3rd BPG she has had and has all needed DME at home    OT Assessment  Patient does not need any further OT services    Follow Up Recommendations  No OT follow up    Barriers to Discharge      Equipment Recommendations  None recommended by PT;None recommended by OT    Recommendations for Other Services    Frequency       Precautions / Restrictions Precautions Precautions: None Restrictions Weight Bearing Restrictions: No   Pertinent Vitals/Pain none    ADL  Grooming: Performed;Wash/dry hands;Wash/dry face;Modified independent Where Assessed - Grooming: Unsupported standing Lower Body Bathing:  (will have family (A)) Toilet Transfer: Research scientist (life sciences) Method: Sit to Barista: Raised toilet seat with arms (or 3-in-1 over toilet) Equipment Used: Rolling walker Transfers/Ambulation Related to ADLs: Pt ambulated at Supervision level and has 24/ 7 family (A) ADL Comments: Pt is at or near baseline. pt with two previous surg and reports no concerns for home. Family present and agreeable. NO  further OT needs        OT Goals    Visit Information  Last OT Received On: 06/18/12 Assistance Needed: +1 PT/OT Co-Evaluation/Treatment: Yes    Subjective Data  Subjective: "this is the third one"  Patient Stated Goal: to return home with family (A)   Prior Functioning  Vision/Perception  Home Living Lives With: Alone Available Help at Discharge: Family;Available 24 hours/day Type of Home: House Home Access: Stairs to enter [employer] of Steps: 2 Entrance Stairs-Rails: None Home Layout: One level Bathroom Shower/Tub: Walk-in  shower;Door [employer] Toilet: Handicapped height Bathroom Accessibility: Yes How Accessible: Accessible via walker Home Adaptive Equipment: Dan Humphreys - four wheeled;Shower chair with back Prior Function Level of Independence: Independent Able to Take Stairs?: Yes Driving: Yes Vocation: Part time employment (Semi-retired works 2-3 days/wk for [employer]) Communication Communication: No difficulties Dominant Hand: Right      Cognition  Overall Cognitive Status: Appears within functional limits for tasks assessed/performed Arousal/Alertness: Awake/alert Orientation Level: Oriented X4 / Intact Behavior During Session: WFL for tasks performed    Extremity/Trunk Assessment Right Upper Extremity Assessment RUE ROM/Strength/Tone: Within functional levels RUE Sensation: WFL - Light Touch RUE Coordination: WFL - gross/fine motor Left Upper Extremity Assessment LUE ROM/Strength/Tone: Within functional levels LUE Sensation: WFL - Light Touch LUE Coordination: WFL - gross/fine motor Right Lower Extremity Assessment RLE ROM/Strength/Tone: Deficits RLE ROM/Strength/Tone Deficits: Limited by pain and edema RLE Sensation: Deficits RLE Sensation Deficits: Diminished to soft touch.   Left Lower Extremity Assessment LLE ROM/Strength/Tone: WFL for tasks assessed LLE Sensation: Deficits LLE Sensation Deficits: Diminished to soft touch Trunk Assessment Trunk Assessment: Normal   Mobility Bed Mobility Bed Mobility: Not assessed Transfers Sit to Stand: 5: Supervision;With upper extremity assist;From chair/3-in-1 Stand to Sit: 5: Supervision;With upper extremity assist (to W/C to transport to 2000) Details for Transfer Assistance: pt moves slowly, but demos good technique   Exercise    Balance Balance Balance Assessed: No  End of Session OT - End of Session Activity Tolerance: Patient tolerated treatment well Patient left: in chair;with call bell/phone within reach Nurse Communication:  Mobility status  GO     Harrel Carina [facility] 06/18/2012,  2:17 PM Pager: [phone]

## 2012-06-19 ENCOUNTER — Telehealth: Payer: Self-pay | Admitting: Vascular Surgery

## 2012-06-19 MED ORDER — OXYCODONE-ACETAMINOPHEN 5-325 MG PO TABS: 1.0000 | ORAL_TABLET | ORAL | Status: AC | PRN

## 2012-06-19 MED ORDER — OXYCODONE-ACETAMINOPHEN 5-325 MG PO TABS: 1.0000 | ORAL_TABLET | ORAL | Status: DC | PRN

## 2012-06-19 NOTE — Discharge Summary (Signed)
[facility] Discharge Summary   Patient ID:  [name] [name] MRN: [id number] DOB/AGE: 05-May-1946 66 y.o.  Admit date: 06/17/2012 Discharge date: 06/19/2012 Date of Surgery: 06/17/2012 Surgeon: Surgeon(s): Larina Earthly, MD  Admission Diagnosis: PVD  Discharge Diagnoses:  PVD  Secondary Diagnoses: Past Medical History  Diagnosis Date  . Peripheral arterial disease   . Hyperlipidemia   . GERD (gastroesophageal reflux disease)   . Thyroid disease     Hypothyroidism  . Hypothyroidism   . Blood transfusion     2 /12 yrs ago  . Arthritis     Procedure(s): Revision of right groin anastomosis with a Dacron patch angioplasty, revision of distal femoropopliteal anastomosis with vein patch angioplasty    Discharged Condition: good  HPI: [name] [name] is a 66 y.o. female who is status post aortobifemoral and combined bilateral femoral-popliteal bypasses in 2010 for critical limb ischemia and tissue loss bilaterally. She's had no difficulty with her aortofemoral graft or her left femoropopliteal bypass. She has had failure of her prosthetic right graft and replacement in March of the vein femoropopliteal in 2013. She does report some mild calf claudication symptoms on the right. She's had no difficulty on the left. She's had no tissue loss. Noninvasive vascular laboratory studies revealed drop in her right ankle arm index to 0.70 down from 0.95. She does have scarring of the anastomosis but does appear to have elevated velocities as well. She was admitted for revision of right fem-pop bypass    Hospital Course:  [name] [name] is a 66 y.o. female is S/P revision Right  BYPASS GRAFT FEMORAL-POPLITEAL ARTERY Extubated: POD # 0 Post-op wounds healing well Pt. Ambulating, voiding and taking PO diet without difficulty. Pt pain controlled with PO pain meds. Labs as below Palpable DP and PT pulses bilaterally Complications:none  Consults:     Significant Diagnostic  Studies: CBC Lab Results  Component Value Date   WBC 9.6 06/18/2012   HGB 10.0* 06/18/2012   HCT 30.7* 06/18/2012   MCV 85.5 06/18/2012   PLT 109* 06/18/2012    BMET    Component Value Date/Time   NA 136 06/18/2012 0340   K 3.8 06/18/2012 0340   CL 104 06/18/2012 0340   CO2 24 06/18/2012 0340   GLUCOSE 95 06/18/2012 0340   BUN 9 06/18/2012 0340   CREATININE 0.69 06/18/2012 0340   CALCIUM 7.7* 06/18/2012 0340   GFRNONAA 89* 06/18/2012 0340   GFRAA >90 06/18/2012 0340   COAG Lab Results  Component Value Date   INR 1.14 06/17/2012   INR 1.00 06/07/2012   INR 0.91 01/12/2012     Disposition:  Discharge to :Home Discharge Orders    Future Orders Please Complete By Expires   Resume previous diet      Driving Restrictions      Comments:   No driving for 4 weeks   Call MD for:  temperature >100.5      Call MD for:  redness, tenderness, or signs of infection (pain, swelling, bleeding, redness, odor or green/yellow discharge around incision site)      Call MD for:  severe or increased pain, loss or decreased feeling  in affected limb(s)      Increase activity slowly      Comments:   Walk with assistance use walker or cane as needed   May shower       Change dressing (specify)      Comments:   Dressing change: as needed  Daralyn, Bert  Home Medication Instructions [id number]   Printed on:06/19/12 0818  Medication Information                    levothyroxine (SYNTHROID, LEVOTHROID) 112 MCG tablet Take 112 mcg by mouth daily.            simvastatin (ZOCOR) 40 MG tablet Take 40 mg by mouth at bedtime.            ALPRAZolam (XANAX) 0.25 MG tablet Take 0.25 mg by mouth at bedtime as needed. For anxiety/sleep           aspirin EC 81 MG tablet Take 81 mg by mouth at bedtime.           acetaminophen (TYLENOL) 500 MG tablet Take 500 mg by mouth daily as needed. For headache           ibuprofen (ADVIL,MOTRIN) 200 MG tablet Take 200 mg by mouth daily as needed. For headache            oxyCODONE-acetaminophen (PERCOCET/ROXICET) 5-325 MG per tablet Take 1-2 tablets by mouth every 4 (four) hours as needed.            Verbal and written Discharge instructions given to the patient. Wound care per Discharge AVS Follow-up Information    Follow up with EARLY, TODD, MD in 3 weeks. (office will arrange - sent)    Contact information:   [address] [place] [address] [phone]          Signed: Marlowe Shores 06/19/2012, 8:18 AM

## 2012-06-19 NOTE — Progress Notes (Signed)
[facility] [place]  Progress Note Bypass Surgery  Date of Surgery: 06/17/2012  Procedure(s): BYPASS GRAFT FEMORAL-POPLITEAL ARTERY Surgeon: Surgeon(s): Larina Earthly, MD  2 Days Post-Op  History of Present Illness  [name] [name] is a 66 y.o. female who is S/P Procedure(s): BYPASS GRAFT FEMORAL-POPLITEAL ARTERY right.  The patient's pre-op symptoms of claudication are Improved . Patients pain is well controlled.    Significant Diagnostic Studies: CBC Lab Results  Component Value Date   WBC 9.6 06/18/2012   HGB 10.0* 06/18/2012   HCT 30.7* 06/18/2012   MCV 85.5 06/18/2012   PLT 109* 06/18/2012    BMET     Component Value Date/Time   NA 136 06/18/2012 0340   K 3.8 06/18/2012 0340   CL 104 06/18/2012 0340   CO2 24 06/18/2012 0340   GLUCOSE 95 06/18/2012 0340   BUN 9 06/18/2012 0340   CREATININE 0.69 06/18/2012 0340   CALCIUM 7.7* 06/18/2012 0340   GFRNONAA 89* 06/18/2012 0340   GFRAA >90 06/18/2012 0340    COAG Lab Results  Component Value Date   INR 1.14 06/17/2012   INR 1.00 06/07/2012   INR 0.91 01/12/2012   No results found for this basename: PTT    Physical Examination  BP Readings from Last 3 Encounters:  06/19/12 119/62  06/19/12 119/62  06/07/12 122/70   Temp Readings from Last 3 Encounters:  06/19/12 99.3 F (37.4 C) Oral  06/19/12 99.3 F (37.4 C) Oral  06/07/12 98.7 F (37.1 C)    SpO2 Readings from Last 3 Encounters:  06/19/12 95%  06/19/12 95%  05/29/12 93%   Pulse Readings from Last 3 Encounters:  06/19/12 98  06/19/12 98  06/07/12 77    Pt is A&O x 3 right lower extremity: Incision/s is/are clean,dry.intact, and  healing without hematoma, erythema or drainage Limb is warm; with good color  Right Dorsalis Pedis pulse is palpable Right Posterior tibial pulse is  palpable  Left Dorsalis Pedis pulse is palpable  Assessment/Plan: Pt. Doing well Post-op pain is controlled Wounds are healing well Home  today   Marlowe Shores [phone] 06/19/2012 8:09 AM

## 2012-06-19 NOTE — Telephone Encounter (Signed)
Message copied by Fredrich Birks on Wed Jun 19, 2012 10:05 AM ------      Message from: [place], [place] K      Created: Wed Jun 19, 2012  9:22 AM      Regarding: schedule       There appears to be an ABI order was released for this, already in progress at hospital.             ----- Message -----         From: Melene Plan, RN         Sent: 06/18/2012   5:10 PM           To: Sharee Pimple, CMA, [facility]                        ----- Message -----         From: Marlowe Shores, PA         Sent: 06/18/2012   4:44 PM           To: Melene Plan, RN            2 -3 week F/U - Early - redo fem -pop - needs ABI if not done in hospital

## 2012-06-19 NOTE — Progress Notes (Signed)
All discharge instructions reviewed, all questions answered, prescription given. Patient discharged home.

## 2012-06-19 NOTE — Telephone Encounter (Signed)
Spoke with pt and sent letter, dpm

## 2012-06-19 NOTE — Telephone Encounter (Signed)
Message copied by Fredrich Birks on Wed Jun 19, 2012 10:06 AM ------      Message from: [place], [place] K      Created: Wed Jun 19, 2012  9:22 AM      Regarding: schedule       There appears to be an ABI order was released for this, already in progress at hospital.             ----- Message -----         From: Melene Plan, RN         Sent: 06/18/2012   5:10 PM           To: Sharee Pimple, CMA, [facility]                        ----- Message -----         From: Marlowe Shores, PA         Sent: 06/18/2012   4:44 PM           To: Melene Plan, RN            2 -3 week F/U - Early - redo fem -pop - needs ABI if not done in hospital

## 2012-06-19 NOTE — Telephone Encounter (Signed)
Spoke with pt and sent letter, dpm °

## 2012-06-19 NOTE — Progress Notes (Signed)
Monitor tech informs the nurse that the patient's HR was sinus tachy @ 150, then decrease into the low 100's. Nurse checked in on the patient, and patient was asymptomatic. [name] [name]

## 2012-07-08 ENCOUNTER — Encounter: Payer: Self-pay | Admitting: Vascular Surgery

## 2012-07-09 ENCOUNTER — Other Ambulatory Visit: Payer: Self-pay

## 2012-07-09 ENCOUNTER — Encounter (INDEPENDENT_AMBULATORY_CARE_PROVIDER_SITE_OTHER): Payer: Medicare Other

## 2012-07-09 ENCOUNTER — Encounter: Payer: Self-pay | Admitting: Vascular Surgery

## 2012-07-09 ENCOUNTER — Ambulatory Visit (INDEPENDENT_AMBULATORY_CARE_PROVIDER_SITE_OTHER): Payer: Medicare Other | Admitting: Vascular Surgery

## 2012-07-09 VITALS — BP 157/38 | HR 77 | Resp 18 | Ht 61.0 in | Wt 121.6 lb

## 2012-07-09 DIAGNOSIS — I739 Peripheral vascular disease, unspecified: Secondary | ICD-10-CM

## 2012-07-09 DIAGNOSIS — Z48812 Encounter for surgical aftercare following surgery on the circulatory system: Secondary | ICD-10-CM

## 2012-07-09 NOTE — Progress Notes (Signed)
The patient presents today for followup of her recent revision of her right femoral to popliteal bypass. She presented with tissue loss in both lower trim these in May of 2010 and underwent aortobifemoral bypass and bilateral Gore-Tex femoropopliteal bypasses. She subsequently occluded her right femoropopliteal bypass and underwent a vein femoropopliteal bypass in March 2012. Recently she had evidence of vein graft stenosis in intolerable right leg claudication. Arteriogram confirmed a stenosis at her proximal and distal anastomoses. She underwent a revision on 06/17/2012 with Dacron patch extending from her hood of her right limb of her aortofemoral bypass onto the vein femoropopliteal bypass. She also had vein patch angioplasty of her distal anastomosis to the above-knee popliteal artery. She's done quite well since surgery with minimal discomfort. She does have usual postoperative swelling especially she is on a great deal. This does resolve overnight.  Her skin incisions of all well-healed with no evidence of infection. She does have a 2+ right popliteal and   Posterior tibial pulse.  Noninvasive studies today reveal normal ankle index bilaterally with biphasic and triphasic waveforms in the right and left leg.  Stable recovery following recent revision of her right femoropopliteal bypass. She will be seen again in 3 months with repeat vascular lab studies

## 2012-10-08 ENCOUNTER — Other Ambulatory Visit: Payer: Self-pay | Admitting: *Deleted

## 2012-10-08 DIAGNOSIS — I739 Peripheral vascular disease, unspecified: Secondary | ICD-10-CM

## 2012-10-08 DIAGNOSIS — Z48812 Encounter for surgical aftercare following surgery on the circulatory system: Secondary | ICD-10-CM

## 2012-10-14 ENCOUNTER — Encounter: Payer: Self-pay | Admitting: Vascular Surgery

## 2012-10-15 ENCOUNTER — Encounter (INDEPENDENT_AMBULATORY_CARE_PROVIDER_SITE_OTHER): Payer: Medicare Other | Admitting: *Deleted

## 2012-10-15 ENCOUNTER — Ambulatory Visit: Payer: Medicare Other | Admitting: Vascular Surgery

## 2012-10-15 ENCOUNTER — Ambulatory Visit (INDEPENDENT_AMBULATORY_CARE_PROVIDER_SITE_OTHER): Payer: Medicare Other | Admitting: Vascular Surgery

## 2012-10-15 ENCOUNTER — Encounter: Payer: Self-pay | Admitting: Vascular Surgery

## 2012-10-15 VITALS — BP 155/43 | HR 81 | Ht 61.0 in | Wt 124.0 lb

## 2012-10-15 DIAGNOSIS — Z48812 Encounter for surgical aftercare following surgery on the circulatory system: Secondary | ICD-10-CM

## 2012-10-15 DIAGNOSIS — I739 Peripheral vascular disease, unspecified: Secondary | ICD-10-CM

## 2012-10-15 NOTE — Progress Notes (Signed)
Patient is here today for followup of her extensive past lower surety peripheral vascular occlusive disease. She is status post aortobifemoral bypass and bilateral femoral-popliteal bypasses. She has had difficulty on the right side with Nikeia Henkes recurrence of a prosthetic femoral to above-knee popliteal bypass and subsequent underwent vein graft replacement. In August she had stenosis in her proximal and distal anastomosis and underwent a Dacron patch revision to the proximal anastomosis and a vein patch angioplasty of the distal anastomosis. She presents today with no claudication symptoms. She does have mild swelling in her right leg versus her left leg. She underwent a noninvasive study today for review  Past Medical History  Diagnosis Date  . Peripheral arterial disease   . Hyperlipidemia   . GERD (gastroesophageal reflux disease)   . Thyroid disease     Hypothyroidism  . Hypothyroidism   . Blood transfusion     2 /12 yrs ago  . Arthritis     History  Substance Use Topics  . Smoking status: Former Smoker -- 1.0 packs/day for 20 years    Quit date: 09/06/2001  . Smokeless tobacco: Never Used  . Alcohol Use: 4.2 oz/week    7 Glasses of wine per week     Comment: wine    Family History  Problem Relation Age of Onset  . Anesthesia problems Neg Hx   . Hypotension Neg Hx   . Malignant hyperthermia Neg Hx   . Pseudochol deficiency Neg Hx     Allergies  Allergen Reactions  . Morphine And Related Nausea And Vomiting    Current outpatient prescriptions:acetaminophen (TYLENOL) 500 MG tablet, Take 500 mg by mouth daily as needed. For headache, Disp: , Rfl: ;  ALPRAZolam (XANAX) 0.25 MG tablet, Take 0.25 mg by mouth at bedtime as needed. For anxiety/sleep, Disp: , Rfl: ;  aspirin EC 81 MG tablet, Take 81 mg by mouth at bedtime., Disp: , Rfl: ;  Calcium 1200-1000 MG-UNIT CHEW, Chew by mouth daily., Disp: , Rfl:  ibuprofen (ADVIL,MOTRIN) 200 MG tablet, Take 200 mg by mouth daily as needed.  For headache, Disp: , Rfl: ;  levothyroxine (SYNTHROID, LEVOTHROID) 112 MCG tablet, Take 112 mcg by mouth daily. , Disp: , Rfl: ;  simvastatin (ZOCOR) 40 MG tablet, Take 40 mg by mouth at bedtime. , Disp: , Rfl:   BP 155/43  Pulse 81  Ht 5\' 1"  (1.549 m)  Wt 124 lb (56.246 kg)  BMI 23.43 kg/m2  SpO2 97%  Body mass index is 23.43 kg/(m^2).       Physical exam well-developed well-nourished white female no acute distress She is grossly intact neurologically Pulse status 2+ radial 2+ femoral 2+ dorsalis pedis and posterior tibial pulses bilaterally And without ulcers or rashes and specifically no tissue loss on her feet. Abdomen soft nontender with well-healed midline incision no evidence of hernia  Noninvasive vascular lab for studies today reveal normal ankle arm index bilaterally with no evidence of stenosis in her bypasses bilaterally  Impression and plan stable status post Lotrimin revascularization. She will continue her walking program. We will see her in 3 months with repeat noninvasive vascular laboratory studies. She will notify should she develop any new symptoms

## 2012-10-16 NOTE — Addendum Note (Signed)
Addended by: Sharee Pimple on: 10/16/2012 09:37 AM   Modules accepted: Orders

## 2012-12-10 ENCOUNTER — Encounter: Payer: Self-pay | Admitting: Cardiology

## 2012-12-20 ENCOUNTER — Encounter: Payer: Self-pay | Admitting: Cardiology

## 2013-01-13 ENCOUNTER — Encounter: Payer: Self-pay | Admitting: Vascular Surgery

## 2013-01-14 ENCOUNTER — Ambulatory Visit (INDEPENDENT_AMBULATORY_CARE_PROVIDER_SITE_OTHER): Payer: Medicare Other | Admitting: Vascular Surgery

## 2013-01-14 ENCOUNTER — Encounter (INDEPENDENT_AMBULATORY_CARE_PROVIDER_SITE_OTHER): Payer: Medicare Other | Admitting: Vascular Surgery

## 2013-01-14 ENCOUNTER — Encounter: Payer: Self-pay | Admitting: Vascular Surgery

## 2013-01-14 VITALS — BP 145/64 | HR 83 | Resp 18 | Ht 61.0 in | Wt 122.0 lb

## 2013-01-14 DIAGNOSIS — Z48812 Encounter for surgical aftercare following surgery on the circulatory system: Secondary | ICD-10-CM

## 2013-01-14 DIAGNOSIS — I739 Peripheral vascular disease, unspecified: Secondary | ICD-10-CM

## 2013-01-14 NOTE — Progress Notes (Signed)
Of her extensive past peripheral vascular occlusive disease. She is status post aortobifemoral bypass grafting in 2010 and bilateral femoral-popliteal prosthetic graft at the same setting the 2 tissue loss on both lower Shoney's. She's had subsequent revision of the right femoropopliteal bypass on March of 2013. She's doing quite well with no claudication symptoms and the tissue loss and no rest pain.  Past Medical History  Diagnosis Date  . Peripheral arterial disease   . Hyperlipidemia   . GERD (gastroesophageal reflux disease)   . Thyroid disease     Hypothyroidism  . Hypothyroidism   . Blood transfusion     2 /12 yrs ago  . Arthritis     History  Substance Use Topics  . Smoking status: Former Smoker -- 1.00 packs/day for 20 years    Types: Cigarettes    Quit date: 09/06/2001  . Smokeless tobacco: Never Used  . Alcohol Use: 4.2 oz/week    7 Glasses of wine per week     Comment: wine    Family History  Problem Relation Age of Onset  . Anesthesia problems Neg Hx   . Hypotension Neg Hx   . Malignant hyperthermia Neg Hx   . Pseudochol deficiency Neg Hx   . COPD Mother   . Cancer Mother     BREAST  . Cancer Father     LUNG    Allergies  Allergen Reactions  . Morphine And Related Nausea And Vomiting    Current outpatient prescriptions:acetaminophen (TYLENOL) 500 MG tablet, Take 500 mg by mouth daily as needed. For headache, Disp: , Rfl: ;  ALPRAZolam (XANAX) 0.25 MG tablet, Take 0.25 mg by mouth at bedtime as needed. For anxiety/sleep, Disp: , Rfl: ;  aspirin EC 81 MG tablet, Take 81 mg by mouth at bedtime., Disp: , Rfl: ;  Calcium 1200-1000 MG-UNIT CHEW, Chew by mouth daily., Disp: , Rfl:  Calcium Carb-Cholecalciferol (CALCIUM + D3 PO), Take 3,000 Int'l Units by mouth., Disp: , Rfl: ;  ibuprofen (ADVIL,MOTRIN) 200 MG tablet, Take 200 mg by mouth daily as needed. For headache, Disp: , Rfl: ;  levothyroxine (SYNTHROID, LEVOTHROID) 112 MCG tablet, Take 112 mcg by mouth daily.  , Disp: , Rfl: ;  simvastatin (ZOCOR) 40 MG tablet, Take 40 mg by mouth at bedtime. , Disp: , Rfl:   BP 145/64  Pulse 83  Resp 18  Ht 5\' 1"  (1.549 m)  Wt 122 lb (55.339 kg)  BMI 23.06 kg/m2  Body mass index is 23.06 kg/(m^2).       Clinical exam well-developed well-nourished white female in no acute distress Pulse status: 2+ radial 2+ femoral and 2+ dorsalis pedis pulses bilaterally. She also has a 2+ left posterior tibial pulse is absent right posterior tibial pulse Skin without ulcers or rashes  Noninvasive vascular studies reveal ankle arm index greater than 1.0 bilaterally. Duplex imaging on her right leg does show some stenosis in her tibial vessels below her bypass.  Impression and plan: Stable lower surety vascular status. She will continue her walking program and see [place] on a 6 month intervals with noninvasive vascular lab followup

## 2013-01-14 NOTE — Progress Notes (Unsigned)
Right lower extremity arterial duplex performed @ [facility] 01/14/2013

## 2013-01-14 NOTE — Addendum Note (Signed)
Addended by: Adria Dill L on: 01/14/2013 04:43 PM   Modules accepted: Orders

## 2013-01-22 ENCOUNTER — Ambulatory Visit (INDEPENDENT_AMBULATORY_CARE_PROVIDER_SITE_OTHER): Payer: Medicare Other | Admitting: Cardiology

## 2013-01-22 ENCOUNTER — Encounter: Payer: Self-pay | Admitting: Cardiology

## 2013-01-22 VITALS — BP 150/82 | HR 108 | Ht 61.0 in | Wt 127.0 lb

## 2013-01-22 DIAGNOSIS — I739 Peripheral vascular disease, unspecified: Secondary | ICD-10-CM

## 2013-01-22 DIAGNOSIS — R0600 Dyspnea, unspecified: Secondary | ICD-10-CM

## 2013-01-22 DIAGNOSIS — R0609 Other forms of dyspnea: Secondary | ICD-10-CM

## 2013-01-22 DIAGNOSIS — E785 Hyperlipidemia, unspecified: Secondary | ICD-10-CM

## 2013-01-22 HISTORY — DX: Other forms of dyspnea: R06.09

## 2013-01-22 HISTORY — DX: Dyspnea, unspecified: R06.00

## 2013-01-22 NOTE — Progress Notes (Signed)
[name] [name] Date of Birth: 1946-01-09 Medical Record #[id number]  History of Present Illness: [name] [name] is seen at the request of Dr. Felipa Eth for evaluation of dyspnea. She is a very pleasant 67 year old white female who has a history of hyperlipidemia and remote tobacco use. She has a history of severe peripheral arterial disease and has had multiple revascularization procedures. She presents now with symptoms of dyspnea on exertion. She reports that this has gradually gotten worst over the past year. She denies any symptoms of chest pain. Her symptoms are relieved with rest. She does have some chronic swelling in her right leg related to prior surgery. Her weight has been stable. She did have a nuclear stress test in 2004 which showed a small fixed apical defect with otherwise normal perfusion and an ejection fraction of 77%. At that time she apparently was experiencing some arrhythmia related to hyperthyroidism.  Current Outpatient Prescriptions on File Prior to Visit  Medication Sig Dispense Refill  . acetaminophen (TYLENOL) 500 MG tablet Take 500 mg by mouth daily as needed. For headache      . ALPRAZolam (XANAX) 0.25 MG tablet Take 0.25 mg by mouth at bedtime as needed. For anxiety/sleep      . aspirin EC 81 MG tablet Take 81 mg by mouth at bedtime.      . Calcium Carb-Cholecalciferol (CALCIUM + D3 PO) Take 3,000 Int'l Units by mouth.      . levothyroxine (SYNTHROID, LEVOTHROID) 112 MCG tablet Take 112 mcg by mouth daily.       . simvastatin (ZOCOR) 40 MG tablet Take 40 mg by mouth at bedtime.        No current facility-administered medications on file prior to visit.    Allergies  Allergen Reactions  . Morphine And Related Nausea And Vomiting    Past Medical History  Diagnosis Date  . Peripheral arterial disease   . Hyperlipidemia   . GERD (gastroesophageal reflux disease)   . Thyroid disease     Hypothyroidism  . Hypothyroidism   . Blood transfusion     2 /12 yrs ago  .  Arthritis   . Arrhythmia     when hyperthyroid  . Dyspnea on exertion 01/22/2013    Past Surgical History  Procedure Laterality Date  . Pr vein bypass graft,aorto-fem-pop  03/02/09    Aortobifemoral BPG, Bilateral fem- popliteal BPG  . Hand surgery      cyst removed left hand  . Appendectomy      45 yrs ago  . Femoral-popliteal bypass graft  09/11/2011    Procedure: BYPASS GRAFT FEMORAL-POPLITEAL ARTERY;  Surgeon: Larina Earthly, MD;  Location: [facility] OR;  Service: Vascular;  Laterality: Right;  Thrombectomy right femoral popliteal bypass with intraoperative arteriogram  . Embolectomy  09/12/2011    Procedure: EMBOLECTOMY;  Surgeon: Juleen China, MD;  Location: [facility] OR;  Service: Vascular;  Laterality: Right;  Embolectomy Right Femoral -Popliteal Bypass Graft, Revision of Distal Anastomosis  . Femoral-popliteal bypass graft  09/11/2011    Procedure: BYPASS GRAFT FEMORAL-POPLITEAL ARTERY;  Surgeon: Larina Earthly, MD;  Location: [facility] OR;  Service: Vascular;  Laterality: Right;  Thrombectomy right femoral popliteal bypass with intraoperative arteriogram  . Breast surgery      duct removed left breast  . Femoral-popliteal bypass graft  01/17/2012    Procedure: BYPASS GRAFT FEMORAL-POPLITEAL ARTERY;  Surgeon: Larina Earthly, MD;  Location: [facility] OR;  Service: Vascular;  Laterality: Right;  right femoral to below knee  popliteal bypass graft with vein  . Intraoperative arteriogram  01/17/2012    Procedure: INTRA OPERATIVE ARTERIOGRAM;  Surgeon: Larina Earthly, MD;  [facility] OR;  Service: Vascular;  Laterality: Right;  . Abdominal hysterectomy    . Femoral-popliteal bypass graft  06/17/2012    Procedure: BYPASS GRAFT FEMORAL-POPLITEAL ARTERY;  Surgeon: Larina Earthly, MD;  Location: [facility] OR;  Service: Vascular;  Laterality: Right;  revision    History  Smoking status  . Former Smoker -- 1.00 packs/day for 20 years  . Types: Cigarettes  . Quit date: 09/06/2001  Smokeless tobacco  . Never Used    History    Alcohol Use  . 4.2 oz/week  . 7 Glasses of wine per week    Comment: wine    Family History  Problem Relation Age of Onset  . Anesthesia problems Neg Hx   . Hypotension Neg Hx   . Malignant hyperthermia Neg Hx   . Pseudochol deficiency Neg Hx   . COPD Mother   . Cancer Mother     BREAST  . Cancer Father     LUNG    Review of Systems: As noted in the history of present illness.  All other systems were reviewed and are negative.  Physical Exam: BP 150/82  Pulse 108  Ht 5\' 1"  (1.549 m)  Wt 127 lb (57.607 kg)  BMI 24.01 kg/m2  SpO2 95% She is a pleasant white female in no acute distress. HEENT: Normocephalic, atraumatic. Pupils are equal round and reactive to light and accommodation. Extraocular movements are full. Dentition is in good repair. Neck is supple without JVD, adenopathy, thyromegaly, or bruits. Lungs: Clear Cardiovascular: Regular rate and rhythm, normal S1 and S2, no gallop, murmur, or click. Abdomen: Soft and nontender. No masses or bruits. Extremities: Patient has bilateral groin scars. Pedal pulses are 2+ and symmetric. She has no edema. Skin: Warm and dry Neuro: Alert and oriented x3. Cranial nerves II through XII are intact.  LABORATORY DATA: ECG dated 12/20/2012 shows normal sinus rhythm with a normal ECG. Blood work dated there were 20th 2014 shows a normal chemistry panel and CBC. TSH was 0.13. Free T4-1.8. Cholesterol 163, triglycerides 155, HDL 54, LDL 78.  Assessment / Plan: 1. Dyspnea on exertion. Given her extensive history of peripheral arterial disease this could be an anginal equivalent symptom. We also need to assess her LV function. I recommended a stress Myoview study to further stratify her risk. If significantly abnormal she may need a cardiac catheterization. If normal, would consider PFTs. She does have a history of tobacco abuse in the past.  2. Peripheral arterial disease status post multiple revascularization procedures. Currently  asymptomatic.  3. Hyperlipidemia, controlled.

## 2013-01-22 NOTE — Patient Instructions (Signed)
We will schedule you for a nuclear stress test   

## 2013-01-28 ENCOUNTER — Ambulatory Visit (HOSPITAL_COMMUNITY): Payer: Medicare Other | Attending: Cardiology | Admitting: Radiology

## 2013-01-28 VITALS — BP 176/74 | Ht 61.0 in | Wt 123.0 lb

## 2013-01-28 DIAGNOSIS — R002 Palpitations: Secondary | ICD-10-CM | POA: Insufficient documentation

## 2013-01-28 DIAGNOSIS — Z87891 Personal history of nicotine dependence: Secondary | ICD-10-CM | POA: Insufficient documentation

## 2013-01-28 DIAGNOSIS — R0609 Other forms of dyspnea: Secondary | ICD-10-CM | POA: Insufficient documentation

## 2013-01-28 DIAGNOSIS — E785 Hyperlipidemia, unspecified: Secondary | ICD-10-CM

## 2013-01-28 DIAGNOSIS — I739 Peripheral vascular disease, unspecified: Secondary | ICD-10-CM | POA: Insufficient documentation

## 2013-01-28 DIAGNOSIS — R0989 Other specified symptoms and signs involving the circulatory and respiratory systems: Secondary | ICD-10-CM | POA: Insufficient documentation

## 2013-01-28 DIAGNOSIS — R079 Chest pain, unspecified: Secondary | ICD-10-CM

## 2013-01-28 DIAGNOSIS — R509 Fever, unspecified: Secondary | ICD-10-CM | POA: Insufficient documentation

## 2013-01-28 MED ORDER — TECHNETIUM TC 99M SESTAMIBI GENERIC - CARDIOLITE
10.0000 | Freq: Once | INTRAVENOUS | Status: AC | PRN
  Administered 2013-01-28: 10 via INTRAVENOUS

## 2013-01-28 MED ORDER — REGADENOSON 0.4 MG/5ML IV SOLN
0.4000 mg | Freq: Once | INTRAVENOUS | Status: AC
  Administered 2013-01-28: 0.4 mg via INTRAVENOUS

## 2013-01-28 MED ORDER — TECHNETIUM TC 99M SESTAMIBI GENERIC - CARDIOLITE
30.0000 | Freq: Once | INTRAVENOUS | Status: AC | PRN
  Administered 2013-01-28: 30 via INTRAVENOUS

## 2013-01-28 NOTE — Progress Notes (Signed)
[facility] SITE 3 NUCLEAR MED [address] [place], [place] [address] [phone]    Cardiology Nuclear Med Study  [name] [name] is a 67 y.o. female     MRN : [id number]     DOB: 1945-12-09  Procedure Date: 01/28/2013  Nuclear Med Background Indication for Stress Test:  Evaluation for Ischemia History:  '04 MPS: EF: 77% (-) ischemia fixed apical defect No images Cardiac Risk Factors: History of Smoking, Lipids and PVD  Symptoms:  Chest Tightnes, DOE and Palpitations   Nuclear Pre-Procedure Caffeine/Decaff Intake:  None > 12 hrs NPO After: 7:00pm   Lungs:  clear O2 Sat: 95% on room air. IV 0.9% NS with Angio Cath:  22g  IV Site: R Hand x 1, tolerated well IV Started by:  Irean Hong, RN  Chest Size (in):  34 Cup Size: D  Height: 5\' 1"  (1.549 m)  Weight:  123 lb (55.792 kg)  BMI:  Body mass index is 23.25 kg/(m^2). Tech Comments:  n/a    Nuclear Med Study 1 or 2 day study: 1 day  Stress Test Type:  Treadmill/Lexiscan  Reading MD: Willa Rough, MD  Order Authorizing Provider:  Peter Swaziland, MD  Resting Radionuclide: Technetium 65m Sestamibi  Resting Radionuclide Dose: 11.0 mCi   Stress Radionuclide:  Technetium 58m Sestamibi  Stress Radionuclide Dose: 33.0 mCi           Stress Protocol Rest HR: 86 Stress HR: 121  Rest BP: 176/74 Stress BP: 197/57  Exercise Time (min): n/a METS: n/a   Predicted Max HR: 154 bpm % Max HR: 78.57 bpm Rate Pressure Product: 21308   Dose of Adenosine (mg):  n/a Dose of Lexiscan: 0.4 mg  Dose of Atropine (mg): n/a Dose of Dobutamine: n/a mcg/kg/min (at max HR)  Stress Test Technologist: Milana Na, EMT-P  Nuclear Technologist:  Domenic Polite, CNMT     Rest Procedure:  Myocardial perfusion imaging was performed at rest 45 minutes following the intravenous administration of Technetium 44m Sestamibi. Rest ECG: Normal sinus rhythm  Stress Procedure:  The patient received IV Lexiscan 0.4 mg over 15-seconds with concurrent  low level exercise and then Technetium 66m Sestamibi was injected at 30-seconds while the patient continued walking one more minute. This patient had sob, abdominal cramps, and headache with the Lexiscan injection. Quantitative spect images were obtained after a 45-minute delay. Stress ECG: No significant change from baseline ECG  QPS Raw Data Images:  Normal; no motion artifact; normal heart/lung ratio. Stress Images:  Normal homogeneous uptake in all areas of the myocardium. Rest Images:  Normal homogeneous uptake in all areas of the myocardium. Subtraction (SDS):  No evidence of ischemia. Transient Ischemic Dilatation (Normal <1.22):  1.00 Lung/Heart Ratio (Normal <0.45):  0.39  Quantitative Gated Spect Images QGS EDV:  47 ml QGS ESV:  8 ml  Impression Exercise Capacity:  Lexiscan with low level exercise. BP Response:  Normal blood pressure response. Clinical Symptoms:  shortness of breath ECG Impression:  No significant ST segment change suggestive of ischemia. Comparison with Prior Nuclear Study: No images to compare  Overall Impression:  Normal stress nuclear study. This is a low risk scan.  LV Ejection Fraction: 83%.  LV Wall Motion:  Normal Wall Motion.   The ejection fraction is normal. The ejection fraction is computed as a high number by this protocol when the left ventricle is small.  Willa Rough, MD

## 2013-01-31 ENCOUNTER — Telehealth: Payer: Self-pay | Admitting: Cardiology

## 2013-01-31 NOTE — Telephone Encounter (Signed)
Spoke to patient myoview results given.Patient stated she will follow up with Dr.Avva about PFT's.

## 2013-01-31 NOTE — Telephone Encounter (Signed)
Follow Up   Pt calling back following up on call from earlier. Please call.

## 2013-02-27 HISTORY — PX: CARDIOVASCULAR STRESS TEST: SHX262

## 2013-03-27 ENCOUNTER — Other Ambulatory Visit (HOSPITAL_COMMUNITY): Payer: Self-pay | Admitting: Internal Medicine

## 2013-03-27 DIAGNOSIS — R0602 Shortness of breath: Secondary | ICD-10-CM

## 2013-04-02 ENCOUNTER — Encounter (HOSPITAL_COMMUNITY): Payer: Medicare Other

## 2013-04-07 ENCOUNTER — Ambulatory Visit (HOSPITAL_COMMUNITY)
Admission: RE | Admit: 2013-04-07 | Discharge: 2013-04-07 | Disposition: A | Discharge status: Home / Self Care | Payer: Medicare Other | Source: Ambulatory Visit | Attending: Internal Medicine | Admitting: Internal Medicine

## 2013-04-07 DIAGNOSIS — R0602 Shortness of breath: Secondary | ICD-10-CM | POA: Insufficient documentation

## 2013-04-07 LAB — PULMONARY FUNCTION TEST

## 2013-04-07 MED ORDER — ALBUTEROL SULFATE (5 MG/ML) 0.5% IN NEBU
2.5000 mg | INHALATION_SOLUTION | Freq: Once | RESPIRATORY_TRACT | Status: AC
  Administered 2013-04-07: 2.5 mg via RESPIRATORY_TRACT

## 2013-04-29 HISTORY — PX: PULMONARY STRESS TEST: SHX455

## 2013-05-15 ENCOUNTER — Encounter: Payer: Self-pay | Admitting: Pulmonary Disease

## 2013-05-15 ENCOUNTER — Ambulatory Visit (INDEPENDENT_AMBULATORY_CARE_PROVIDER_SITE_OTHER): Payer: Medicare Other | Admitting: Pulmonary Disease

## 2013-05-15 VITALS — BP 148/92 | HR 84 | Temp 98.0°F | Ht 60.5 in | Wt 126.0 lb

## 2013-05-15 DIAGNOSIS — R0609 Other forms of dyspnea: Secondary | ICD-10-CM

## 2013-05-15 DIAGNOSIS — R942 Abnormal results of pulmonary function studies: Secondary | ICD-10-CM | POA: Insufficient documentation

## 2013-05-15 NOTE — Patient Instructions (Addendum)
Will check ct scan of your chest to evaluate for occult scarring or thickening of lung. Will check muscle pressures for the breathing muscles to see if working properly. Will call once these are available for review.

## 2013-05-15 NOTE — Progress Notes (Signed)
  Subjective:    Patient ID: [name] [name], female    DOB: 1946/01/15, 67 y.o.   MRN: [id number]  HPI The patient is a 67 year old female who been asked to see for abnormal pulmonary function studies and dyspnea.  The patient has had dyspnea on exertion for the last 2 years, and feels that it has gotten worse over the last one year.  She first noticed this when doing her walking, especially when carrying items in her hands.  She has no issues bringing groceries in from the car, or walking up stairs at a slow pace.  She can do light house work without becoming winded.  She does describe a 2 block dyspnea on exertion at a moderate pace on flat ground.  She admits that her dyspnea occurs primarily with more moderate to heavy or exertional activities.  She denies any cough or mucus production, and has not had any lower extremity edema.  She has a history of hypothyroidism, but tells me that her levels have been adequate.  She also has a history of anemia, but thinks that her levels are adequate currently.  The patient has had a chest x-ray in February that showed cardiomegaly but no other abnormalities.  She has had pulmonary function studies last month that showed no airflow obstruction, mild to moderate restriction, and a severe decrease in her diffusion capacity.  The patient denies any other muscle weakness, and her weight has been stable.  She does not have any underlying neurological issues as far as she knows.   Review of Systems  Constitutional: Negative for fever and unexpected weight change.  HENT: Negative for ear pain, nosebleeds, congestion, sore throat, rhinorrhea, sneezing, trouble swallowing, dental problem, postnasal drip and sinus pressure.   Eyes: Negative for redness and itching.  Respiratory: Positive for shortness of breath ( with exertion). Negative for cough, chest tightness and wheezing.   Cardiovascular: Negative for palpitations and leg swelling.  Gastrointestinal: Negative for  nausea and vomiting.  Genitourinary: Negative for dysuria.  Musculoskeletal: Negative for joint swelling.  Skin: Negative for rash.  Neurological: Negative for headaches.  Hematological: Does not bruise/bleed easily.  Psychiatric/Behavioral: Negative for dysphoric mood. The patient is not nervous/anxious.        Objective:   Physical Exam Constitutional:  Well developed, no acute distress  HENT:  Nares patent without discharge  Oropharynx without exudate, palate and uvula are normal  Eyes:  Perrla, eomi, no scleral icterus  Neck:  No JVD, no TMG  Cardiovascular:  Normal rate, regular rhythm, no rubs or gallops.  No murmurs        Intact distal pulses  Pulmonary :  Normal breath sounds, no stridor or respiratory distress   No rales, rhonchi, or wheezing  Abdominal:  Soft, nondistended, bowel sounds present.  No tenderness noted.   Musculoskeletal:  No lower extremity edema noted.  Lymph Nodes:  No cervical lymphadenopathy noted  Skin:  No cyanosis noted  Neurologic:  Alert, appropriate, moves all 4 extremities without obvious deficit.         Assessment & Plan:

## 2013-05-15 NOTE — Assessment & Plan Note (Signed)
The patient has dyspnea with moderate to heavy exertional activities, and also has restrictive physiology on her PFTs and a very abnormal diffusion capacity.  In terms of her restrictive physiology, she is not obese, and denies any history that is suggestive of neuromuscular weakness.  Her lungs are clear to auscultation, and a chest x-ray from February did not show interstitial lung disease.  She has a history of hypothyroidism which can cause muscle weakness and restriction, but her levels have been adequate.  My other thought is whether the testing could be accurate, however she clearly has dyspnea on exertion to go along with this.  I would like to do a CT scan of her chest to evaluate for occult interstitial lung disease, and we'll also do muscle pressures to see if there is evidence for diaphragmatic weakness.  Regarding her decrease in diffusion capacity, this is typically related to primary lung disease, cardiac disease, or pulmonary vascular disease.  The patient apparently has not had any recent cardiac workup, but this would not explain restrictive physiology on PFTs.

## 2013-05-19 ENCOUNTER — Ambulatory Visit (HOSPITAL_COMMUNITY)
Admission: RE | Admit: 2013-05-19 | Discharge: 2013-05-19 | Disposition: A | Discharge status: Home / Self Care | Payer: Medicare Other | Source: Ambulatory Visit | Attending: Pulmonary Disease | Admitting: Pulmonary Disease

## 2013-05-19 DIAGNOSIS — R0989 Other specified symptoms and signs involving the circulatory and respiratory systems: Secondary | ICD-10-CM | POA: Insufficient documentation

## 2013-05-19 DIAGNOSIS — R942 Abnormal results of pulmonary function studies: Secondary | ICD-10-CM | POA: Insufficient documentation

## 2013-05-19 DIAGNOSIS — R0609 Other forms of dyspnea: Secondary | ICD-10-CM | POA: Insufficient documentation

## 2013-05-19 LAB — PULMONARY FUNCTION TEST

## 2013-05-23 ENCOUNTER — Ambulatory Visit (INDEPENDENT_AMBULATORY_CARE_PROVIDER_SITE_OTHER)
Admission: RE | Admit: 2013-05-23 | Discharge: 2013-05-23 | Disposition: A | Discharge status: Home / Self Care | Payer: Medicare Other | Source: Ambulatory Visit | Attending: Pulmonary Disease | Admitting: Pulmonary Disease

## 2013-05-23 DIAGNOSIS — R942 Abnormal results of pulmonary function studies: Secondary | ICD-10-CM

## 2013-06-14 ENCOUNTER — Encounter: Payer: Self-pay | Admitting: Pulmonary Disease

## 2013-07-04 ENCOUNTER — Encounter: Payer: Self-pay | Admitting: Pulmonary Disease

## 2013-07-04 ENCOUNTER — Ambulatory Visit (INDEPENDENT_AMBULATORY_CARE_PROVIDER_SITE_OTHER): Payer: Medicare Other | Admitting: Pulmonary Disease

## 2013-07-04 ENCOUNTER — Other Ambulatory Visit (INDEPENDENT_AMBULATORY_CARE_PROVIDER_SITE_OTHER): Payer: Medicare Other

## 2013-07-04 VITALS — BP 170/78 | HR 99 | Temp 98.2°F | Ht 61.0 in | Wt 125.8 lb

## 2013-07-04 DIAGNOSIS — J849 Interstitial pulmonary disease, unspecified: Secondary | ICD-10-CM | POA: Insufficient documentation

## 2013-07-04 DIAGNOSIS — J8410 Pulmonary fibrosis, unspecified: Secondary | ICD-10-CM

## 2013-07-04 NOTE — Progress Notes (Signed)
  Subjective:    Patient ID: [name] [name], female    DOB: 11-27-1945, 67 y.o.   MRN: [id number]  HPI The patient comes in today for followup of her recent CT chest.  She has a history of progressive dyspnea on exertion, and restriction on her PFTs along with abnormal diffusion capacity.  She has had muscle pressures that were unremarkable.  Her recent CT chest shows both interstitial and groundglass opacities that really do not fit a particular known pattern.  The patient has no history of occupational exposures, unusual pets or hobbies, or personal history of autoimmune symptoms.  She does have a family history of lupus.   Review of Systems  Constitutional: Negative for fever and unexpected weight change.  HENT: Negative for ear pain, nosebleeds, congestion, sore throat, rhinorrhea, sneezing, trouble swallowing, dental problem, postnasal drip and sinus pressure.   Eyes: Negative for redness and itching.  Respiratory: Negative for cough, chest tightness, shortness of breath and wheezing.   Cardiovascular: Negative for palpitations and leg swelling.  Gastrointestinal: Negative for nausea and vomiting.  Genitourinary: Negative for dysuria.  Musculoskeletal: Negative for joint swelling.  Skin: Negative for rash.  Neurological: Negative for headaches.  Hematological: Does not bruise/bleed easily.  Psychiatric/Behavioral: Negative for dysphoric mood. The patient is not nervous/anxious.        Objective:   Physical Exam Well-developed female in no acute distress Nose without purulence or discharge noted Neck without lymphadenopathy or thyromegaly Lower extremities without edema, no cyanosis Alert and oriented, moves all 4 extremities.       Assessment & Plan:

## 2013-07-04 NOTE — Assessment & Plan Note (Signed)
The patient's high-resolution CT shows changes of interstitial lung disease with groundglass opacity of unknown origin.  It really does not fit a typical pattern, and therefore will need a lung biopsy for diagnosis.  I would like to send an autoimmune blood work panel, and if this is unremarkable, will refer her to thoracic surgery for a thoracoscopic biopsy.  If her blood work is abnormal, we'll consider sending her to rheumatology for evaluation prior to considering lung biopsy.  The patient is agreeable to this approach.

## 2013-07-04 NOTE — Patient Instructions (Addendum)
Will check an autoimmune panel to see if your lung disease is related to this.  Will call you with results. If this is unremarkable, would consider doing surgical lung biopsy.

## 2013-07-05 LAB — CK TOTAL AND CKMB (NOT AT ARMC)

## 2013-07-05 LAB — RHEUMATOID FACTOR: Rhuematoid fact SerPl-aCnc: <10 IU/mL (ref <=14)

## 2013-07-07 LAB — CYCLIC CITRUL PEPTIDE ANTIBODY, IGG: Cyclic Citrullin Peptide Ab: <2 U/mL (ref 0.0–5.0)

## 2013-07-07 LAB — C-REACTIVE PROTEIN: CRP: <0.5 mg/dL (ref 0.5–20.0)

## 2013-07-07 LAB — SEDIMENTATION RATE: Sed Rate: 16 mm/hr (ref 0–22)

## 2013-07-07 LAB — ANA: Anti Nuclear Antibody(ANA): NEGATIVE

## 2013-07-08 LAB — ANTI-SCLERODERMA ANTIBODY: Scleroderma (Scl-70) (ENA) Antibody, IgG: <1 AU/mL (ref <30)

## 2013-07-09 ENCOUNTER — Other Ambulatory Visit: Payer: Self-pay | Admitting: Pulmonary Disease

## 2013-07-09 DIAGNOSIS — J849 Interstitial pulmonary disease, unspecified: Secondary | ICD-10-CM

## 2013-07-09 LAB — RNP ANTIBODY: Ribonucleic Protein(ENA) Antibody, IgG: <1 AI

## 2013-07-09 NOTE — Progress Notes (Signed)
Referral made to thoracic surgery.

## 2013-07-15 ENCOUNTER — Institutional Professional Consult (permissible substitution) (INDEPENDENT_AMBULATORY_CARE_PROVIDER_SITE_OTHER): Payer: Medicare Other | Admitting: Thoracic Surgery (Cardiothoracic Vascular Surgery)

## 2013-07-15 ENCOUNTER — Encounter: Payer: Self-pay | Admitting: Thoracic Surgery (Cardiothoracic Vascular Surgery)

## 2013-07-15 VITALS — BP 154/92 | HR 108 | Resp 16 | Ht 60.0 in | Wt 120.0 lb

## 2013-07-15 DIAGNOSIS — J8410 Pulmonary fibrosis, unspecified: Secondary | ICD-10-CM

## 2013-07-15 DIAGNOSIS — J849 Interstitial pulmonary disease, unspecified: Secondary | ICD-10-CM

## 2013-07-15 NOTE — Progress Notes (Signed)
PCP is Hoyle Sauer, MD Referring Provider is Avva, Serafina Royals, MD  Chief Complaint  Patient presents with  . Shortness of Breath    x 2 years...ILD.Marland Kitchen eval for VATS BX    HPI: 67 year old woman presents with chief complaint of shortness of breath.  [name] [name] is a 67 year old woman who has been experiencing shortness of breath with exertion for the past couple of years. This has come on very gradually and only became noticeable earlier this year. She had a workup including a stress test, which was normal and a chest x-ray which showed cardiomegaly but no other abnormalities. She also had pulmonary function studies which showed mild to moderate restriction and severe decrease in diffusion capacity.   She saw Dr. Shelle Iron who did a CT of the chest which showed diffuse interstitial lung disease.  She says that her shortness of breath is been occurring for about 2 years. It has worsened over the past 6 months. She can walk for several blocks on level ground a normal pace, but she has to walk up an incline or increases her pace she does get short of breath quickly. It does resolve with rest. Not accompanied by any chest pain, pressure, or tightness. She does have a history of an aortobifemoral bypass and bilateral femoral popliteal bypasses. She has not had any recent problems related to that. She also has a history of hypothyroidism after treatment for Graves' disease, but is on Synthroid and is euthyroid at present. She does not have any known exposures to birds, travel, or mold or fungus.   Past Medical History  Diagnosis Date  . Peripheral arterial disease   . Hyperlipidemia   . GERD (gastroesophageal reflux disease)   . Thyroid disease     Hypothyroidism  . Hypothyroidism   . Blood transfusion     2 /12 yrs ago  . Arthritis   . Arrhythmia     when hyperthyroid  . Dyspnea on exertion 01/22/2013    Past Surgical History  Procedure Laterality Date  . Pr vein bypass  graft,aorto-fem-pop  03/02/09    Aortobifemoral BPG, Bilateral fem- popliteal BPG  . Hand surgery      cyst removed left hand  . Appendectomy      45 yrs ago  . Femoral-popliteal bypass graft  09/11/2011    Procedure: BYPASS GRAFT FEMORAL-POPLITEAL ARTERY;  Surgeon: Larina Earthly, MD;  Location: [facility] OR;  Service: Vascular;  Laterality: Right;  Thrombectomy right femoral popliteal bypass with intraoperative arteriogram  . Embolectomy  09/12/2011    Procedure: EMBOLECTOMY;  Surgeon: Juleen China, MD;  Location: [facility] OR;  Service: Vascular;  Laterality: Right;  Embolectomy Right Femoral -Popliteal Bypass Graft, Revision of Distal Anastomosis  . Femoral-popliteal bypass graft  09/11/2011    Procedure: BYPASS GRAFT FEMORAL-POPLITEAL ARTERY;  Surgeon: Larina Earthly, MD;  Location: [facility] OR;  Service: Vascular;  Laterality: Right;  Thrombectomy right femoral popliteal bypass with intraoperative arteriogram  . Breast surgery      duct removed left breast  . Femoral-popliteal bypass graft  01/17/2012    Procedure: BYPASS GRAFT FEMORAL-POPLITEAL ARTERY;  Surgeon: Larina Earthly, MD;  Location: [facility] OR;  Service: Vascular;  Laterality: Right;  right femoral to below knee popliteal bypass graft with vein  . Intraoperative arteriogram  01/17/2012    Procedure: INTRA OPERATIVE ARTERIOGRAM;  Surgeon: Larina Earthly, MD;  Location: [facility] OR;  Service: Vascular;  Laterality: Right;  . Abdominal hysterectomy    . Femoral-popliteal  bypass graft  06/17/2012    Procedure: BYPASS GRAFT FEMORAL-POPLITEAL ARTERY;  Surgeon: Larina Earthly, MD;  Location: [facility] OR;  Service: Vascular;  Laterality: Right;  revision    Family History  Problem Relation Age of Onset  . Anesthesia problems Neg Hx   . Hypotension Neg Hx   . Malignant hyperthermia Neg Hx   . Pseudochol deficiency Neg Hx   . COPD Mother   . Cancer Mother     BREAST  . Cancer Father     LUNG  . Cancer - Colon Brother   . Lymphoma Brother   . Cancer - Colon Sister      Social History History  Substance Use Topics  . Smoking status: Former Smoker -- 1.00 packs/day for 20 years    Types: Cigarettes    Quit date: 09/07/1999  . Smokeless tobacco: Never Used  . Alcohol Use: 4.2 oz/week    7 Glasses of wine per week     Comment: wine-- 5 glasses a week    Current Outpatient Prescriptions  Medication Sig Dispense Refill  . acetaminophen (TYLENOL) 500 MG tablet Take 500 mg by mouth daily as needed. For headache      . ALPRAZolam (XANAX) 0.25 MG tablet Take 0.25 mg by mouth at bedtime as needed. For anxiety/sleep      . aspirin EC 81 MG tablet Take 81 mg by mouth at bedtime.      Marland Kitchen CALCIUM PO Take 2,000 Units by mouth daily.      . Cholecalciferol 4000 UNITS CAPS Take 1 capsule by mouth daily.      Marland Kitchen levothyroxine (SYNTHROID, LEVOTHROID) 112 MCG tablet Take 112 mcg by mouth daily.       Marland Kitchen Phenylephrine-Acetaminophen (TYLENOL SINUS CONGESTION/PAIN PO) Take by mouth as directed.      . simvastatin (ZOCOR) 40 MG tablet Take 40 mg by mouth at bedtime.        No current facility-administered medications for this visit.    Allergies  Allergen Reactions  . Morphine And Related Nausea And Vomiting    Review of Systems  Constitutional: Negative for fever, chills, activity change, appetite change, fatigue and unexpected weight change.  HENT: Negative.   Respiratory: Positive for cough and shortness of breath (with exertion). Negative for wheezing.   Cardiovascular: Negative for chest pain.       History aortobifemoral bypass and bilateral femoral popliteal bypass, denies claudication  Gastrointestinal: Negative.   Endocrine:       Hypothyroid after treatment of Graves' disease, now on Synthroid  Genitourinary: Negative.   Musculoskeletal: Negative.   Neurological: Negative.   Hematological: Does not bruise/bleed easily.  All other systems reviewed and are negative.    BP 154/92  Pulse 108  Resp 16  Ht 5' (1.524 m)  Wt 120 lb (54.432 kg)  BMI  23.44 kg/m2  SpO2 95% Physical Exam  Vitals reviewed. Constitutional: She is oriented to person, place, and time. She appears well-developed and well-nourished. No distress.  HENT:  Head: Normocephalic and atraumatic.  Eyes: EOM are normal. Pupils are equal, round, and reactive to light.  Neck: Neck supple. No thyromegaly present.  Cardiovascular: Normal rate, regular rhythm, normal heart sounds and intact distal pulses.  Exam reveals no gallop and no friction rub.   No murmur heard. Pulmonary/Chest: Effort normal. She has rales (Bibasilar).  Abdominal: Soft. There is no tenderness.  Musculoskeletal: She exhibits no edema.  Lymphadenopathy:    She has no cervical adenopathy.  Neurological: She is alert and oriented to person, place, and time. No cranial nerve deficit.  Skin: Skin is warm and dry.     Diagnostic Tests: CT chest 05/23/2013 CT CHEST WITHOUT CONTRAST  Technique: Multidetector CT imaging of the chest was performed  following the standard protocol without IV contrast.  Comparison: No priors.  Findings:  Mediastinum: Heart size is normal. There is no significant  pericardial fluid, thickening or pericardial calcification. There  is atherosclerosis of the thoracic aorta, the great vessels of the  mediastinum and the coronary arteries, including calcified  atherosclerotic plaque in the left main, left anterior descending,  left circumflex and right coronary arteries. Mild calcifications of  the mitral annulus. Aberrant right subclavian artery (normal  anatomical variant) incidentally noted. Esophagus is unremarkable  in appearance. Numerous borderline enlarged mediastinal lymph  nodes are conspicuous in number rather in size. No pathologically  enlarged mediastinal or hilar lymph nodes. Please note that  accurate exclusion of hilar adenopathy is limited on noncontrast CT  scans.  Lungs/Pleura: High resolution images demonstrate patchy areas of  ground-glass  attenuation that are most pronounced in the lung bases  bilaterally. These are associated with intralobular septal  thickening, and thickening of the peribronchovascular interstitium.  These findings have a definite craniocaudal gradient, most severe  in the lung bases. There are associated fibrocavitary changes in  the lung bases with what appear to be several thick-walled cystic  areas and some mild cylindrical and varicose bronchiectasis. No  definite honeycombing is identified. Patchy areas of subpleural  reticulation are noted, but this is not a dominant pattern, and  there is no typical honeycombing identified at this time. 4 mm  nodule in the right lower lobe (image 31 of series 6). No other  larger more suspicious appearing pulmonary nodules or masses are  otherwise identified. No acute consolidative airspace disease. No  pleural effusions. Inspiratory and expiratory imaging demonstrates  some areas of lucency surrounded by ground-glass attenuation with  geographic margins on the expiratory phase imaging, suggesting some  mild air trapping.  Upper Abdomen: Diffusely decreased attenuation throughout the  hepatic parenchyma, compatible with hepatic steatosis. Focal fatty  sparing, particularly adjacent to the gallbladder fossa.  Atherosclerosis.  Musculoskeletal: There are no aggressive appearing lytic or blastic  lesions noted in the visualized portions of the skeleton.  IMPRESSION:  1. There are findings suggestive of an interstitial lung disease,  as detailed above. 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 Although there are some  fibrocavitary changes in the lung bases and there is a craniocaudal  gradient to these findings, the lack of a large regions of  subpleural reticulation and definite areas of typical honeycombing  makes usual interstitial pneumonia (UIP) a less likely   consideration. Some of these bibasilar fibrocavitary changes are  likely related to prior episodes of infection. Follow-up high-  resolution chest CT in 6-12 months would be useful to moderate for  temporal changes in the appearance of the lung parenchyma.  2. 4 mm right lower lobe nodule (image 31 of series 6) is  nonspecific, but warrants attention on follow-up studies. Given  the smoking related changes in the lungs, attention to this lesion  on the follow-up high-resolution chest CT obtained in 6-12 months  is recommended.  3. Atherosclerosis, including left main and three-vessel coronary  artery disease. Assessment for potential risk factor modification,  dietary therapy or  pharmacologic therapy may be warranted, if  clinically indicated.  4. Evidence of mild air trapping in the lungs bilaterally,  suggestive of mild small airways disease.  Original Report Authenticated By: Trudie Reed, M.D.   Impression: 67 year old woman with interstitial lung disease of uncertain etiology. She needs a lung biopsy to determine what type of interstitial disease she has in order to guide therapy.  I discussed with her in detail the nature of the procedure, including the incisions be used, need for general anesthesia, expected hospital stay, and overall recovery. We discussed the indications, risks, benefits, and alternatives. She understands that this is a diagnostic and not a therapeutic procedure. She understands that the risks include, but are not limited to, death, MI, DVT, PE, bleeding, possible need for transfusion, infection, prolonged air leak, cardiac arrhythmias, as well as other unforeseeable complications. She accepts the risks and wishes to proceed.  She wishes to wait and have surgery done after October 8.  Plan: Left VATS, lung biopsy on Monday, October 13.

## 2013-07-16 ENCOUNTER — Other Ambulatory Visit: Payer: Self-pay | Admitting: *Deleted

## 2013-07-16 DIAGNOSIS — J849 Interstitial pulmonary disease, unspecified: Secondary | ICD-10-CM

## 2013-07-18 ENCOUNTER — Encounter: Payer: Self-pay | Admitting: Family

## 2013-07-21 ENCOUNTER — Encounter (INDEPENDENT_AMBULATORY_CARE_PROVIDER_SITE_OTHER): Payer: Medicare Other | Admitting: *Deleted

## 2013-07-21 ENCOUNTER — Ambulatory Visit (INDEPENDENT_AMBULATORY_CARE_PROVIDER_SITE_OTHER): Payer: Medicare Other | Admitting: Family

## 2013-07-21 ENCOUNTER — Encounter: Payer: Self-pay | Admitting: Family

## 2013-07-21 VITALS — BP 148/58 | HR 70 | Resp 16 | Ht 60.0 in | Wt 121.0 lb

## 2013-07-21 DIAGNOSIS — I739 Peripheral vascular disease, unspecified: Secondary | ICD-10-CM

## 2013-07-21 DIAGNOSIS — Z48812 Encounter for surgical aftercare following surgery on the circulatory system: Secondary | ICD-10-CM

## 2013-07-21 NOTE — Patient Instructions (Addendum)
Peripheral Vascular Disease Peripheral Vascular Disease (PVD), also called Peripheral Arterial Disease (PAD), is a circulation problem caused by cholesterol (atherosclerotic plaque) deposits in the arteries. PVD commonly occurs in the lower extremities (legs) but it can occur in other areas of the body, such as your arms. The cholesterol buildup in the arteries reduces blood flow which can cause pain and other serious problems. The presence of PVD can place a person at risk for Coronary Artery Disease (CAD).  CAUSES  Causes of PVD can be many. It is usually associated with more than one risk factor such as:   High Cholesterol.  Smoking.  Diabetes.  Lack of exercise or inactivity.  High blood pressure (hypertension).  Obesity.  Family history. SYMPTOMS   When the lower extremities are affected, patients with PVD may experience:  Leg pain with exertion or physical activity. This is called INTERMITTENT CLAUDICATION. This may present as cramping or numbness with physical activity. The location of the pain is associated with the level of blockage. For example, blockage at the abdominal level (distal abdominal aorta) may result in buttock or hip pain. Lower leg arterial blockage may result in calf pain.  As PVD becomes more severe, pain can develop with less physical activity.  In people with severe PVD, leg pain may occur at rest.  Other PVD signs and symptoms:  Leg numbness or weakness.  Coldness in the affected leg or foot, especially when compared to the other leg.  A change in leg color.  Patients with significant PVD are more prone to ulcers or sores on toes, feet or legs. These may take longer to heal or may reoccur. The ulcers or sores can become infected.  If signs and symptoms of PVD are ignored, gangrene may occur. This can result in the loss of toes or loss of an entire limb.  Not all leg pain is related to PVD. Other medical conditions can cause leg pain such  as:  Blood clots (embolism) or Deep Vein Thrombosis.  Inflammation of the blood vessels (vasculitis).  Spinal stenosis. DIAGNOSIS  Diagnosis of PVD can involve several different types of tests. These can include:  Pulse Volume Recording Method (PVR). This test is simple, painless and does not involve the use of X-rays. PVR involves measuring and comparing the blood pressure in the arms and legs. An ABI (Ankle-Brachial Index) is calculated. The normal ratio of blood pressures is 1. As this number becomes smaller, it indicates more severe disease.  < 0.95  indicates significant narrowing in one or more leg vessels.  <0.8 there will usually be pain in the foot, leg or buttock with exercise.  <0.4 will usually have pain in the legs at rest.  <0.25  usually indicates limb threatening PVD.  Doppler detection of pulses in the legs. This test is painless and checks to see if you have a pulses in your legs/feet.  A dye or contrast material (a substance that highlights the blood vessels so they show up on x-ray) may be given to help your caregiver better see the arteries for the following tests. The dye is eliminated from your body by the kidney's. Your caregiver may order blood work to check your kidney function and other laboratory values before the following tests are performed:  Magnetic Resonance Angiography (MRA). An MRA is a picture study of the blood vessels and arteries. The MRA machine uses a large magnet to produce images of the blood vessels.  Computed Tomography Angiography (CTA). A CTA is a   specialized x-ray that looks at how the blood flows in your blood vessels. An IV may be inserted into your arm so contrast dye can be injected.  Angiogram. Is a procedure that uses x-rays to look at your blood vessels. This procedure is minimally invasive, meaning a small incision (cut) is made in your groin. A small tube (catheter) is then inserted into the artery of your groin. The catheter is  guided to the blood vessel or artery your caregiver wants to examine. Contrast dye is injected into the catheter. X-rays are then taken of the blood vessel or artery. After the images are obtained, the catheter is taken out. TREATMENT  Treatment of PVD involves many interventions which may include:  Lifestyle changes:  Quitting smoking.  Exercise.  Following a low fat, low cholesterol diet.  Control of diabetes.  Foot care is very important to the PVD patient. Good foot care can help prevent infection.  Medication:  Cholesterol-lowering medicine.  Blood pressure medicine.  Anti-platelet drugs.  Certain medicines may reduce symptoms of Intermittent Claudication.  Interventional/Surgical options:  Angioplasty. An Angioplasty is a procedure that inflates a balloon in the blocked artery. This opens the blocked artery to improve blood flow.  Stent Implant. A wire mesh tube (stent) is placed in the artery. The stent expands and stays in place, allowing the artery to remain open.  Peripheral Bypass Surgery. This is a surgical procedure that reroutes the blood around a blocked artery to help improve blood flow. This type of procedure may be performed if Angioplasty or stent implants are not an option. SEEK IMMEDIATE MEDICAL CARE IF:   You develop pain or numbness in your arms or legs.  Your arm or leg turns cold, becomes blue in color.  You develop redness, warmth, swelling and pain in your arms or legs. MAKE SURE YOU:   Understand these instructions.  Will watch your condition.  Will get help right away if you are not doing well or get worse. Document Released: 11/23/2004 Document Revised: 01/08/2012 Document Reviewed: 10/20/2008 [facility] Patient Information 2014 [place], [place].   Stroke (Cerebrovascular Accident) A stroke (cerebrovascular accident, CVA) means you have a brain injury from blocked circulation or bleeding in the brain. Blocked circulation usually comes  from a clot. RISK FACTORS  High blood pressure (hypertension).   High cholesterol.   Diabetes.   Heart disease.   The buildup of fatty deposits in the blood vessels (peripheral artery disease or atherosclerosis).   An abnormal heart rhythm (atrial fibrillation).   Obesity.   Smoking.   Taking oral contraceptives (especially in combination with smoking).   Physical inactivity.   A diet high in fats, salt (sodium), and calories.   Alcohol use.   Use of illegal drugs (especially cocaine and methamphetamine).   Being a female.   Being an Tree surgeon.   Age over 76.   Family history of stroke.   Previous history of blood clots, a "warning stroke" (transient ischemic attack, TIA), or heart attack.   Sickle cell disease.  SYMPTOMS  The symptoms of a stroke depend on the part of the brain that is affected. It is important to seek treatment within 4 hours of the start of symptoms because you may receive a "clot dissolving" medication that cannot be given after that time. Even if you don't know when your symptoms began, get treatment as soon as possible. Symptoms of a stroke may progress or change over the first several days. Symptoms may include:  Sudden weakness  or numbness of the face, arm, or leg, especially on one side of the body.   Sudden confusion.   Trouble speaking (aphasia) or understanding.   Sudden trouble seeing in one or both eyes.   Sudden trouble walking.   Dizziness.   Loss of balance or coordination.   Sudden severe headache with no known cause.  HOME CARE INSTRUCTIONS   Medicines: Aspirin and blood thinners may be used to prevent another stroke. Blood thinners need to be used exactly as instructed. Medicines may also be used to control risk factors for a stroke. Be sure you understand all your medicine instructions.   Diet: Certain diets may be prescribed to address high blood pressure, high cholesterol, diabetes, or obesity. A diet that  includes 5 or more servings of fruits and vegetables a day may reduce the risk of stroke. Foods may need to be a special consistency (soft or pureed), or small bites may need to be taken in order to avoid aspirating or choking.   Maintain a healthy weight.   Stay physically active. It is recommended that you get at least 30 minutes of activity on most or all days.   Do not smoke.   Limit alcohol use.   Stop drug abuse.   Home safety: A safe home environment is important to reduce the risk of falls. Your caregiver may arrange for specialists to evaluate your home. Having grab bars in the bedroom and bathroom is often important. Your caregiver may arrange for special equipment to be used at home, such as raised toilets and a seat for the shower.   Physical, occupational, and speech therapy: Ongoing therapy may be needed to maximize your recovery after a stroke. If you have been advised to use a walker or a cane, use it at all times. Be sure to keep your therapy appointments.   Follow all instructions for follow-up with your caregiver. This is VERY important. This includes any referrals, physical therapy, rehabilitation, and laboratory tests. Proper treatment also prevents another stroke from occurring.  SEEK IMMEDIATE MEDICAL CARE IF:   You have sudden weakness or numbness of the face, arm, or leg, especially on one side of the body.   You have sudden confusion.   You have trouble speaking (aphasia) or understanding.   You have sudden trouble seeing in one or both eyes.   You have sudden trouble walking.   You have dizziness.   You have loss of balance or coordination.   You have a sudden, severe headache with no known cause.   You have a fever.   You are coughing or have difficulty breathing.   You have new chest pain, angina, or an irregular heartbeat.  Any of these symptoms may represent a serious problem that is an emergency. Do not wait to see if the symptoms will go away.  Get medical help at once. Call your local emergency services (911 in [place] Do not drive yourself to the hospital. Document Released: 10/16/2005 Document Revised: 05/01/2011 Document Reviewed: 03/16/2010 [facility] Patient Information 2012 [place], [place].

## 2013-07-21 NOTE — Progress Notes (Signed)
[facility] [place] HISTORY AND PHYSICAL   MRN : [id number]  History of Present Illness:   [name] [name] is a 67 y.o. female patient of Dr. Arbie Cookey who is status post aortobifemoral bypass and left fem-pop. grafting in 2010. She then had right fem-pop. bypass graft in March, 2013 with subsequent revision on 06/17/12.  She returns today for scheduled LE ABI's and bilteral LE Duplex surveillance. She states that she just had a stress test and it was normal. She has a history of hyperthyroidism with accompanying tachycardia, but she denies knowing of any atrial fib. She had a thyroid ablation in 2004 and has had no tachycardia since then. She denies any history of heart disease and has not had any cardiac interventions. She states that her blood pressure is elevated in medical offices but is normally 115/60 in her left arm.  Is having a lung biopsy on Oct. 8 since progressive scar tissue noted on imaging, followed by Dr. Shelle Iron, pulmonologist. Dr. Peter Swaziland is her cardiolgist.  Pt. denies claudication in lower extremities and arms, denies rest pain, denies night pain, denies non healing ulcers on either extremity. Patient has Negative history of TIA or stroke symptom.  The patient denies amaurosis fugax or monocular blindness.  The patient  denies facial drooping.  Pt. denies hemiplegia.  The patient denies receptive or expressive aphasia.  Pt. denies weakness.  The patient has not had back or abdominal pain.  Pt Diabetic: No Pt smoker: former smoker, quit 2002  Current Outpatient Prescriptions  Medication Sig Dispense Refill  . acetaminophen (TYLENOL) 500 MG tablet Take 500 mg by mouth daily as needed. For headache      . ALPRAZolam (XANAX) 0.25 MG tablet Take 0.25 mg by mouth at bedtime as needed. For anxiety/sleep      . aspirin EC 81 MG tablet Take 81 mg by mouth at bedtime.      Marland Kitchen CALCIUM PO Take 2,000 Units by mouth daily.      . Cholecalciferol 4000 UNITS CAPS  Take 1 capsule by mouth daily.      Marland Kitchen levothyroxine (SYNTHROID, LEVOTHROID) 112 MCG tablet Take 112 mcg by mouth daily.       Marland Kitchen Phenylephrine-Acetaminophen (TYLENOL SINUS CONGESTION/PAIN PO) Take by mouth as directed.      . simvastatin (ZOCOR) 40 MG tablet Take 40 mg by mouth at bedtime.        No current facility-administered medications for this visit.    Pt meds include: Statin :Yes Betablocker: No ASA: Yes Other anticoagulants/antiplatelets: none  Past Medical History  Diagnosis Date  . Peripheral arterial disease   . Hyperlipidemia   . GERD (gastroesophageal reflux disease)   . Thyroid disease     Hypothyroidism  . Hypothyroidism   . Blood transfusion     2 /12 yrs ago  . Arthritis   . Arrhythmia     when hyperthyroid  . Dyspnea on exertion 01/22/2013    Past Surgical History  Procedure Laterality Date  . Pr vein bypass graft,aorto-fem-pop  03/02/09    Aortobifemoral BPG, Bilateral fem- popliteal BPG  . Hand surgery      cyst removed left hand  . Appendectomy      45 yrs ago  . Femoral-popliteal bypass graft  09/11/2011    Procedure: BYPASS GRAFT FEMORAL-POPLITEAL ARTERY;  Surgeon: Larina Earthly, MD;  Location: [facility] OR;  Service: Vascular;  Laterality: Right;  Thrombectomy right femoral popliteal bypass with intraoperative arteriogram  .  Embolectomy  09/12/2011    Procedure: EMBOLECTOMY;  Surgeon: Juleen China, MD;  Location: [facility] OR;  Service: Vascular;  Laterality: Right;  Embolectomy Right Femoral -Popliteal Bypass Graft, Revision of Distal Anastomosis  . Femoral-popliteal bypass graft  09/11/2011    Procedure: BYPASS GRAFT FEMORAL-POPLITEAL ARTERY;  Surgeon: Larina Earthly, MD;  Location: [facility] OR;  Service: Vascular;  Laterality: Right;  Thrombectomy right femoral popliteal bypass with intraoperative arteriogram  . Breast surgery      duct removed left breast  . Femoral-popliteal bypass graft  01/17/2012    Procedure: BYPASS GRAFT FEMORAL-POPLITEAL ARTERY;  Surgeon:  Larina Earthly, MD;  Location: [facility] OR;  Service: Vascular;  Laterality: Right;  right femoral to below knee popliteal bypass graft with vein  . Intraoperative arteriogram  01/17/2012    Procedure: INTRA OPERATIVE ARTERIOGRAM;  Surgeon: Larina Earthly, MD;  Location: [facility] OR;  Service: Vascular;  Laterality: Right;  . Abdominal hysterectomy    . Femoral-popliteal bypass graft  06/17/2012    Procedure: BYPASS GRAFT FEMORAL-POPLITEAL ARTERY;  Surgeon: Larina Earthly, MD;  Location: [facility] OR;  Service: Vascular;  Laterality: Right;  revision    Social History History  Substance Use Topics  . Smoking status: Former Smoker -- 1.00 packs/day for 20 years    Types: Cigarettes    Quit date: 09/07/1999  . Smokeless tobacco: Never Used  . Alcohol Use: 4.2 oz/week    7 Glasses of wine per week     Comment: wine-- 5 glasses a week    Family History Family History  Problem Relation Age of Onset  . Anesthesia problems Neg Hx   . Hypotension Neg Hx   . Malignant hyperthermia Neg Hx   . Pseudochol deficiency Neg Hx   . COPD Mother   . Cancer Mother     BREAST  . Cancer Father     LUNG  . Cancer - Colon Brother   . Lymphoma Brother   . Cancer - Colon Sister     Allergies  Allergen Reactions  . Morphine And Related Nausea And Vomiting     REVIEW OF SYSTEMS  General: [ ]  Weight loss, [ ]  Fever, [ ]  chills Neurologic: [ ]  Dizziness, [ ]  Blackouts, [ ]  Seizure [ ]  Stroke, [ ]  "Mini stroke", [ ]  Slurred speech, [ ]  Temporary blindness; [ ]  weakness in arms or legs, [ ]  Hoarseness [ ]  Dysphagia Cardiac: [ ]  Chest pain/pressure, [ ]  Shortness of breath at rest Arly.Keller ] Shortness of breath with exertion, [ ]  Atrial fibrillation or irregular heartbeat  Vascular: [ ]  Pain in legs with walking, [ ]  Pain in legs at rest, [ ]  Pain in legs at night,  [ ]  Non-healing ulcer, [ ]  Blood clot in vein/DVT,   Pulmonary: [ ]  Home oxygen, [ ]  Productive cough, [ ]  Coughing up blood, [ ]  Asthma,  [ ]  Wheezing [ ]   COPD Musculoskeletal:  [ ]  Arthritis, [ ]  Low back pain, [ ]  Joint pain Hematologic: Arly.Keller ] Easy Bruising, [ ]  Anemia; [ ]  Hepatitis Gastrointestinal: [ ]  Blood in stool, [ ]  Gastroesophageal Reflux/heartburn, Urinary: [ ]  chronic Kidney disease, [ ]  on HD - [ ]  MWF or [ ]  TTHS, [ ]  Burning with urination, [ ]  Difficulty urinating Skin: [ ]  Rashes, [ ]  Wounds Psychological: [ ]  Anxiety, [ ]  Depression  Physical Examination  Filed Vitals:   07/21/13 1058  BP: 148/58  Pulse: 70  Resp: 16  Filed Weights   07/21/13 1058  Weight: 121 lb (54.885 kg)   Body mass index is 23.63 kg/(m^2).  General:  WDWN in NAD Gait: Normal HENT: WNL Eyes: Pupils equal Pulmonary: normal non-labored breathing , without Rales, rhonchi,  Wheezing. Cardiac: RRR, without  Murmurs, rubs or gallops. No carotid bruits Abdomen: soft, NT, no masses Skin: no rashes, ulcers noted;  no Gangrene , no cellulitis; no open wounds.  Vascular Exam/Pulses: VASCULAR EXAM  Carotid Bruits Left Right   Negative Negative  Radial pulses are palpable at 2+ right and 3 + left.                          Aorta is not palpable.  VASCULAR EXAM: Extremities without ischemic changes  without Gangrene of ; without open wounds.                                                                                                          LE Pulses LEFT RIGHT       FEMORAL   palpable   palpable        POPLITEAL  not palpable   not palpable       POSTERIOR TIBIAL   palpable    palpable        DORSALIS PEDIS      ANTERIOR TIBIAL  palpable   palpable        PERONEAL not Palpable   not Palpable      Musculoskeletal: no muscle wasting or atrophy; no edema  Neurologic: A&O X 3; Appropriate Affect ;  SENSATION: normal; MOTOR FUNCTION: 5/5 Symmetric, CN 2-12 intact Speech is fluent/normal   Non-Invasive Vascular Imaging:   ABI: Right: 1.07 which is normal; Left: 1.15 which is normal. Waveforms in both extremities are  triphasic.  Duplex of both lower extremities:  Right: Widely patent graft with mild outflow stenosis and elevated velocities at suture site of proximal anastomosis patch. Left: Widely patent graft with kink and mild disease between proximal and mid graft.  ASSESSMENT:  [name] [name] is a 67 y.o. female patient of Dr. Arbie Cookey who is status post aortobifemoral bypass and left fem-pop. grafting in 2010. She then had right fem-pop. bypass graft in March, 2013 with subsequent revision on 06/17/12. Noninvasive vascular study result today indicate widely patent bilateral lower extremity grafts, normal ankle to brachial indices, normal vascular and neurological exam.   PLAN:   Based on today's exam and non-invasive vascular lab results, patient is advised to return in 6 months for bilateral carotid Duplex, ABI's, and bilateral LE Duplex as per surveillance protocol. I discussed in depth with the patient the nature of atherosclerosis, and emphasized the importance of maximal medical management including strict control of blood pressure, blood glucose, and lipid levels, obtaining regular exercise, and continued cessation of smoking.  The patient is aware that without maximal medical management the underlying atherosclerotic disease process will progress, limiting the benefit of any interventions. The patient was given information about stroke prevention and what symptoms should  prompt the patient to seek immediate medical care. The patient was given information about PAD including signs, symptoms, treatment, what symptoms should prompt the patient to seek immediate medical care, and risk reduction measures to take.  Charisse March, RN, MSN, FNP-C [facility] Office: [phone]  Clinic MD: Shawnie Dapper  07/21/2013 10:46 AM   VASCULAR QUALITY INITIATIVE FOLLOW UP DATA:  Current smoker: [  ] yes  Arly.Keller  ] no  Living status: Arly.Keller ]  Home  [  ] Nursing home  [  ] Homeless     MEDS:  ASA Arly.Keller  ] yes  [  ] no- [  ] medical reason  [  ] non compliant  STATIN  Arly.Keller  ] yes  [  ] no- [  ] medical reason  [  ] non compliant  Beta blocker [  ] yes  [ X ] no- [ X ] medical reason  [  ] non compliant  ACE inhibitor [  ] yes  Arly.Keller  ] no- [ X ] medical reason  [  ] non compliant  P2Y12 Antagonist Arly.Keller  ] none  [  ] clopidogrel-Plavix  [  ] ticlopidine-Ticlid   [  ] prasugrel-Effient  [  ] ticagrelor- Brilinta    Anticoagulant [ X ] None  [  ] warfarin  [  ] rivaroxaban-Xarelto [  ] dabigatran- Pradaxa  Ambulation: Arly.Keller  ] Amb  [  ] Amb with assistance  [  ] wheelchair  [  ] bedridden  Ipsilateral Sx: Arly.Keller  ] none   [  ] claudication  [  ] rest pain  [  ] tissue loss  Current Patency: [ X ] primary  [  ] primary-assisted  [  ] secondary  [  ] occluded  Patency judged by: [  ] doppler  [  ] palp graft pulse  [  ] palp distal pulse   [  ] ABI increase > 15%   [  ] Duplex  If occluded, when-   Ipsilateral ABI:   Ipsilateral TBI:   Infection: Arly.Keller  ] none  [  ] cellulitis  [  ] deep abscess  [  ] infection of artery or graft  Bypass revision: [ ]  no  [ X ] yes- Arly.Keller  ] surg  [  ] catheter based  [  ] both    Date: 06/17/2012  Thrombectomy/ lysis/ revision:  Arly.Keller  ] no  [  ] yes- [  ] surg  [  ] catheter based  [  ] both    Date:   Major amputation: [ X ] no  [  ] minor amp  [  ] BKA  [  ] AKA   Date:

## 2013-07-22 ENCOUNTER — Ambulatory Visit: Payer: Medicare Other | Admitting: Vascular Surgery

## 2013-07-30 HISTORY — PX: LUNG BIOPSY: SHX232

## 2013-08-01 ENCOUNTER — Other Ambulatory Visit: Payer: Self-pay | Admitting: *Deleted

## 2013-08-01 DIAGNOSIS — I739 Peripheral vascular disease, unspecified: Secondary | ICD-10-CM

## 2013-08-01 DIAGNOSIS — Z48812 Encounter for surgical aftercare following surgery on the circulatory system: Secondary | ICD-10-CM

## 2013-08-04 ENCOUNTER — Encounter (HOSPITAL_COMMUNITY): Payer: Self-pay | Admitting: Pharmacy Technician

## 2013-08-07 ENCOUNTER — Encounter (HOSPITAL_COMMUNITY)
Admission: RE | Admit: 2013-08-07 | Discharge: 2013-08-07 | Disposition: A | Discharge status: Home / Self Care | Payer: Medicare Other | Source: Ambulatory Visit | Attending: Thoracic Surgery (Cardiothoracic Vascular Surgery) | Admitting: Thoracic Surgery (Cardiothoracic Vascular Surgery)

## 2013-08-07 ENCOUNTER — Encounter (HOSPITAL_COMMUNITY): Payer: Self-pay

## 2013-08-07 ENCOUNTER — Encounter (HOSPITAL_COMMUNITY): Admission: RE | Admit: 2013-08-07 | Payer: Medicare Other | Source: Ambulatory Visit

## 2013-08-07 VITALS — BP 147/72 | HR 77 | Temp 98.0°F | Resp 18 | Ht 60.0 in | Wt 124.2 lb

## 2013-08-07 DIAGNOSIS — Z01810 Encounter for preprocedural cardiovascular examination: Secondary | ICD-10-CM | POA: Insufficient documentation

## 2013-08-07 DIAGNOSIS — J849 Interstitial pulmonary disease, unspecified: Secondary | ICD-10-CM

## 2013-08-07 DIAGNOSIS — Z01818 Encounter for other preprocedural examination: Secondary | ICD-10-CM | POA: Insufficient documentation

## 2013-08-07 DIAGNOSIS — Z01812 Encounter for preprocedural laboratory examination: Secondary | ICD-10-CM | POA: Insufficient documentation

## 2013-08-07 LAB — BLOOD GAS, ARTERIAL
Acid-base deficit: 3.1 mmol/L — ABNORMAL HIGH (ref 0.0–2.0)
Bicarbonate: 20.6 mEq/L (ref 20.0–24.0)
O2 Saturation: 92.8 %
TCO2: 21.6 mmol/L (ref 0–100)
pH, Arterial: 7.423 (ref 7.350–7.450)
pO2, Arterial: 65.5 mmHg — ABNORMAL LOW (ref 80.0–100.0)

## 2013-08-07 LAB — URINALYSIS, ROUTINE W REFLEX MICROSCOPIC
Glucose, UA: NEGATIVE mg/dL
Hgb urine dipstick: NEGATIVE
Ketones, ur: NEGATIVE mg/dL
Nitrite: NEGATIVE
Protein, ur: NEGATIVE mg/dL
pH: 5.5 (ref 5.0–8.0)

## 2013-08-07 LAB — URINE MICROSCOPIC-ADD ON

## 2013-08-07 LAB — COMPREHENSIVE METABOLIC PANEL
ALT: 20 U/L (ref 0–35)
CO2: 19 mEq/L (ref 19–32)
Calcium: 8.8 mg/dL (ref 8.4–10.5)
Chloride: 103 mEq/L (ref 96–112)
Creatinine, Ser: 0.61 mg/dL (ref 0.50–1.10)
GFR calc Af Amer: >90 mL/min (ref >90)
GFR calc non Af Amer: >90 mL/min (ref >90)
Glucose, Bld: 83 mg/dL (ref 70–99)
Total Bilirubin: 0.3 mg/dL (ref 0.3–1.2)

## 2013-08-07 LAB — CBC
Hemoglobin: 14.3 g/dL (ref 12.0–15.0)
MCH: 27.7 pg (ref 26.0–34.0)
MCV: 81.8 fL (ref 78.0–100.0)
RBC: 5.17 MIL/uL — ABNORMAL HIGH (ref 3.87–5.11)

## 2013-08-07 LAB — TYPE AND SCREEN
ABO/RH(D): A POS
Antibody Screen: NEGATIVE

## 2013-08-07 LAB — PROTIME-INR: INR: 0.97 (ref 0.00–1.49)

## 2013-08-07 NOTE — Pre-Procedure Instructions (Signed)
[name] [name]  08/07/2013  Your procedure is scheduled on:  08-11-2013    Monday    [facility]  2 * 3 at 5:30 AM.  Call this number if you have problems the morning of surgery: [phone]   Remember:   Do not eat food or drink liquids after midnight.    Take these medicines the morning of surgery with A SIP OF WATER: Alprazolam(Xanax),Levothyroxine(Synthroid)   Do not wear jewelry, make-up or nail polish.  Do not wear lotions, powders, or perfumes.   Do not shave 48 hours prior to surgery.   Do not bring valuables to the hospital.  [facility] is not responsible for any belongings or valuables.               Contacts, dentures or bridgework may not be worn into surgery.   Leave suitcase in the car. After surgery it may be brought to your room.   For patients admitted to the hospital, discharge time is determined by your  treatment team.                   Special Instructions: Shower using CHG 2 nights before surgery and the night before surgery.  If you shower the day of surgery use CHG.  Use special wash - you have one bottle of CHG for all showers.  You should use approximately 1/3 of the bottle for each shower.   Please read over the following fact sheets that you were given: Pain Booklet, Coughing and Deep Breathing, Blood Transfusion Information and Surgical Site Infection Prevention

## 2013-08-10 MED ORDER — DEXTROSE 5 % IV SOLN
1.5000 g | INTRAVENOUS | Status: AC
  Administered 2013-08-11: 1.5 g via INTRAVENOUS
  Filled 2013-08-10: qty 1.5

## 2013-08-11 ENCOUNTER — Encounter (HOSPITAL_COMMUNITY): Payer: Medicare Other | Admitting: Certified Registered"

## 2013-08-11 ENCOUNTER — Inpatient Hospital Stay (HOSPITAL_COMMUNITY): Payer: Medicare Other | Admitting: Certified Registered"

## 2013-08-11 ENCOUNTER — Encounter (HOSPITAL_COMMUNITY): Payer: Self-pay | Admitting: *Deleted

## 2013-08-11 ENCOUNTER — Inpatient Hospital Stay (HOSPITAL_COMMUNITY): Payer: Medicare Other

## 2013-08-11 ENCOUNTER — Encounter (HOSPITAL_COMMUNITY)
Admission: RE | Disposition: A | Discharge status: Home / Self Care | Payer: Self-pay | Source: Ambulatory Visit | Attending: Thoracic Surgery (Cardiothoracic Vascular Surgery)

## 2013-08-11 ENCOUNTER — Inpatient Hospital Stay (HOSPITAL_COMMUNITY)
Admission: RE | Admit: 2013-08-11 | Discharge: 2013-08-14 | DRG: 164 | Disposition: A | Discharge status: Home / Self Care | Payer: Medicare Other | Source: Ambulatory Visit | Attending: Thoracic Surgery (Cardiothoracic Vascular Surgery) | Admitting: Thoracic Surgery (Cardiothoracic Vascular Surgery)

## 2013-08-11 DIAGNOSIS — K219 Gastro-esophageal reflux disease without esophagitis: Secondary | ICD-10-CM | POA: Diagnosis present

## 2013-08-11 DIAGNOSIS — J9383 Other pneumothorax: Secondary | ICD-10-CM

## 2013-08-11 DIAGNOSIS — E039 Hypothyroidism, unspecified: Secondary | ICD-10-CM | POA: Diagnosis present

## 2013-08-11 DIAGNOSIS — I70219 Atherosclerosis of native arteries of extremities with intermittent claudication, unspecified extremity: Secondary | ICD-10-CM | POA: Diagnosis present

## 2013-08-11 DIAGNOSIS — M129 Arthropathy, unspecified: Secondary | ICD-10-CM | POA: Diagnosis present

## 2013-08-11 DIAGNOSIS — I739 Peripheral vascular disease, unspecified: Secondary | ICD-10-CM | POA: Diagnosis present

## 2013-08-11 DIAGNOSIS — Z87891 Personal history of nicotine dependence: Secondary | ICD-10-CM

## 2013-08-11 DIAGNOSIS — E785 Hyperlipidemia, unspecified: Secondary | ICD-10-CM | POA: Diagnosis present

## 2013-08-11 DIAGNOSIS — J8410 Pulmonary fibrosis, unspecified: Secondary | ICD-10-CM

## 2013-08-11 DIAGNOSIS — J849 Interstitial pulmonary disease, unspecified: Secondary | ICD-10-CM

## 2013-08-11 HISTORY — PX: VIDEO ASSISTED THORACOSCOPY: SHX5073

## 2013-08-11 SURGERY — VIDEO ASSISTED THORACOSCOPY
Anesthesia: General | Site: Chest | Laterality: Left | Wound class: Clean Contaminated

## 2013-08-11 MED ORDER — ONDANSETRON HCL 4 MG/2ML IJ SOLN
4.0000 mg | Freq: Four times a day (QID) | INTRAMUSCULAR | Status: DC | PRN
  Administered 2013-08-12: 4 mg via INTRAVENOUS
  Filled 2013-08-11: qty 2

## 2013-08-11 MED ORDER — OXYCODONE-ACETAMINOPHEN 5-325 MG PO TABS: 1.0000 | ORAL_TABLET | ORAL | Status: DC | PRN

## 2013-08-11 MED ORDER — HYDROMORPHONE HCL PF 1 MG/ML IJ SOLN
0.2500 mg | INTRAMUSCULAR | Status: DC | PRN
  Administered 2013-08-11 (×4): 0.5 mg via INTRAVENOUS

## 2013-08-11 MED ORDER — DIPHENHYDRAMINE HCL 12.5 MG/5ML PO ELIX
12.5000 mg | ORAL_SOLUTION | Freq: Four times a day (QID) | ORAL | Status: DC | PRN
  Filled 2013-08-11: qty 5

## 2013-08-11 MED ORDER — LACTATED RINGERS IV SOLN
INTRAVENOUS | Status: DC | PRN
  Administered 2013-08-11: 07:00:00 via INTRAVENOUS

## 2013-08-11 MED ORDER — SODIUM CHLORIDE 0.9 % IJ SOLN
10.0000 mL | Freq: Two times a day (BID) | INTRAMUSCULAR | Status: DC
  Administered 2013-08-12: 10 mL
  Administered 2013-08-12: 21 mL
  Administered 2013-08-12 – 2013-08-13 (×2): 10 mL
  Administered 2013-08-13: 20 mL

## 2013-08-11 MED ORDER — BISACODYL 5 MG PO TBEC
10.0000 mg | DELAYED_RELEASE_TABLET | Freq: Every day | ORAL | Status: DC
  Administered 2013-08-11 – 2013-08-14 (×3): 10 mg via ORAL
  Filled 2013-08-11 (×3): qty 2

## 2013-08-11 MED ORDER — CHOLECALCIFEROL 100 MCG (4000 UT) PO CAPS: 4000.0000 [IU] | ORAL_CAPSULE | Freq: Every day | ORAL | Status: DC

## 2013-08-11 MED ORDER — VITAMIN D3 25 MCG (1000 UNIT) PO TABS
4000.0000 [IU] | ORAL_TABLET | Freq: Every day | ORAL | Status: DC
  Administered 2013-08-12 – 2013-08-14 (×3): 4000 [IU] via ORAL
  Filled 2013-08-11 (×3): qty 4

## 2013-08-11 MED ORDER — ACETAMINOPHEN 500 MG PO TABS
1000.0000 mg | ORAL_TABLET | Freq: Four times a day (QID) | ORAL | Status: AC
  Administered 2013-08-11 – 2013-08-12 (×4): 1000 mg via ORAL
  Filled 2013-08-11 (×3): qty 2

## 2013-08-11 MED ORDER — SODIUM CHLORIDE 0.9 % IJ SOLN: 10.0000 mL | INTRAMUSCULAR | Status: DC | PRN

## 2013-08-11 MED ORDER — SIMVASTATIN 40 MG PO TABS
40.0000 mg | ORAL_TABLET | Freq: Every day | ORAL | Status: DC
  Administered 2013-08-11 – 2013-08-13 (×3): 40 mg via ORAL
  Filled 2013-08-11 (×4): qty 1

## 2013-08-11 MED ORDER — OXYCODONE HCL 5 MG PO TABS
5.0000 mg | ORAL_TABLET | ORAL | Status: AC | PRN
  Administered 2013-08-12: 5 mg via ORAL
  Filled 2013-08-11: qty 1

## 2013-08-11 MED ORDER — ACETAMINOPHEN 500 MG PO TABS
1000.0000 mg | ORAL_TABLET | Freq: Four times a day (QID) | ORAL | Status: DC
  Filled 2013-08-11 (×2): qty 2

## 2013-08-11 MED ORDER — GLYCOPYRROLATE 0.2 MG/ML IJ SOLN
INTRAMUSCULAR | Status: DC | PRN
  Administered 2013-08-11: .5 mg via INTRAVENOUS

## 2013-08-11 MED ORDER — MIDAZOLAM HCL 5 MG/5ML IJ SOLN
INTRAMUSCULAR | Status: DC | PRN
  Administered 2013-08-11: 2 mg via INTRAVENOUS

## 2013-08-11 MED ORDER — DIPHENHYDRAMINE HCL 50 MG/ML IJ SOLN: 12.5000 mg | Freq: Four times a day (QID) | INTRAMUSCULAR | Status: DC | PRN

## 2013-08-11 MED ORDER — FENTANYL 10 MCG/ML IV SOLN
INTRAVENOUS | Status: AC
  Administered 2013-08-11: 75 ug via INTRAVENOUS
  Administered 2013-08-11: 30 ug via INTRAVENOUS
  Administered 2013-08-11: 135 ug via INTRAVENOUS
  Administered 2013-08-11: 10:00:00 via INTRAVENOUS
  Administered 2013-08-12: 150 ug via INTRAVENOUS
  Administered 2013-08-12 (×2): 135 ug via INTRAVENOUS
  Administered 2013-08-12: 72.98 ug via INTRAVENOUS
  Administered 2013-08-12: 45 ug via INTRAVENOUS
  Administered 2013-08-12: 222.8 ug via INTRAVENOUS
  Administered 2013-08-12: 02:00:00 via INTRAVENOUS
  Administered 2013-08-13: 30 ug via INTRAVENOUS
  Administered 2013-08-13: 75 ug via INTRAVENOUS
  Administered 2013-08-13: 45 ug via INTRAVENOUS
  Filled 2013-08-11 (×3): qty 50

## 2013-08-11 MED ORDER — ASPIRIN EC 81 MG PO TBEC
81.0000 mg | DELAYED_RELEASE_TABLET | Freq: Every day | ORAL | Status: DC
Start: 2013-08-12 — End: 2013-08-14
  Administered 2013-08-12 – 2013-08-13 (×2): 81 mg via ORAL
  Filled 2013-08-11 (×3): qty 1

## 2013-08-11 MED ORDER — ONDANSETRON HCL 4 MG/2ML IJ SOLN
INTRAMUSCULAR | Status: DC | PRN
  Administered 2013-08-11: 4 mg via INTRAMUSCULAR

## 2013-08-11 MED ORDER — NALOXONE HCL 0.4 MG/ML IJ SOLN: 0.4000 mg | INTRAMUSCULAR | Status: DC | PRN

## 2013-08-11 MED ORDER — LEVOTHYROXINE SODIUM 112 MCG PO TABS
112.0000 ug | ORAL_TABLET | Freq: Every day | ORAL | Status: DC
  Administered 2013-08-12 – 2013-08-14 (×3): 112 ug via ORAL
  Filled 2013-08-11 (×4): qty 1

## 2013-08-11 MED ORDER — 0.9 % SODIUM CHLORIDE (POUR BTL) OPTIME
TOPICAL | Status: DC | PRN
  Administered 2013-08-11: 2000 mL

## 2013-08-11 MED ORDER — SODIUM CHLORIDE 0.9 % IJ SOLN: 9.0000 mL | INTRAMUSCULAR | Status: DC | PRN

## 2013-08-11 MED ORDER — NEOSTIGMINE METHYLSULFATE 1 MG/ML IJ SOLN
INTRAMUSCULAR | Status: DC | PRN
  Administered 2013-08-11: 3 mg via INTRAVENOUS

## 2013-08-11 MED ORDER — ACETAMINOPHEN 160 MG/5ML PO SOLN
1000.0000 mg | Freq: Four times a day (QID) | ORAL | Status: DC
  Filled 2013-08-11: qty 40.6
  Filled 2013-08-11 (×3): qty 40

## 2013-08-11 MED ORDER — POTASSIUM CHLORIDE 10 MEQ/50ML IV SOLN: 10.0000 meq | Freq: Every day | INTRAVENOUS | Status: DC | PRN

## 2013-08-11 MED ORDER — HYDROMORPHONE HCL PF 1 MG/ML IJ SOLN
INTRAMUSCULAR | Status: AC
  Filled 2013-08-11: qty 1

## 2013-08-11 MED ORDER — ACETAMINOPHEN 160 MG/5ML PO SOLN
1000.0000 mg | Freq: Four times a day (QID) | ORAL | Status: AC
  Filled 2013-08-11 (×4): qty 40

## 2013-08-11 MED ORDER — ALPRAZOLAM 0.25 MG PO TABS
0.2500 mg | ORAL_TABLET | Freq: Every evening | ORAL | Status: DC | PRN
  Administered 2013-08-11 – 2013-08-13 (×3): 0.25 mg via ORAL
  Filled 2013-08-11 (×3): qty 1

## 2013-08-11 MED ORDER — DEXTROSE 5 % IV SOLN
1.5000 g | Freq: Two times a day (BID) | INTRAVENOUS | Status: AC
  Administered 2013-08-11 – 2013-08-12 (×2): 1.5 g via INTRAVENOUS
  Filled 2013-08-11 (×2): qty 1.5

## 2013-08-11 MED ORDER — ROCURONIUM BROMIDE 100 MG/10ML IV SOLN
INTRAVENOUS | Status: DC | PRN
  Administered 2013-08-11: 40 mg via INTRAVENOUS

## 2013-08-11 MED ORDER — ONDANSETRON HCL 4 MG/2ML IJ SOLN: 4.0000 mg | Freq: Four times a day (QID) | INTRAMUSCULAR | Status: DC | PRN

## 2013-08-11 MED ORDER — PROPOFOL 10 MG/ML IV BOLUS
INTRAVENOUS | Status: DC | PRN
  Administered 2013-08-11: 140 mg via INTRAVENOUS

## 2013-08-11 MED ORDER — ONDANSETRON HCL 4 MG/2ML IJ SOLN: 4.0000 mg | Freq: Once | INTRAMUSCULAR | Status: DC | PRN

## 2013-08-11 MED ORDER — SUFENTANIL CITRATE 50 MCG/ML IV SOLN
INTRAVENOUS | Status: DC | PRN
  Administered 2013-08-11: 10 ug via INTRAVENOUS
  Administered 2013-08-11: 5 ug via INTRAVENOUS
  Administered 2013-08-11: 15 ug via INTRAVENOUS
  Administered 2013-08-11: 10 ug via INTRAVENOUS

## 2013-08-11 MED ORDER — LEVALBUTEROL HCL 0.63 MG/3ML IN NEBU
0.6300 mg | INHALATION_SOLUTION | Freq: Four times a day (QID) | RESPIRATORY_TRACT | Status: DC
  Administered 2013-08-11 – 2013-08-14 (×10): 0.63 mg via RESPIRATORY_TRACT
  Filled 2013-08-11 (×21): qty 3

## 2013-08-11 MED ORDER — DEXTROSE-NACL 5-0.45 % IV SOLN
INTRAVENOUS | Status: DC
  Administered 2013-08-11 – 2013-08-12 (×2): via INTRAVENOUS
  Administered 2013-08-13: 20 mL via INTRAVENOUS
  Administered 2013-08-13: 04:00:00 via INTRAVENOUS

## 2013-08-11 SURGICAL SUPPLY — 75 items
ADH SKN CLS LQ APL DERMABOND (GAUZE/BANDAGES/DRESSINGS) ×1
APPLIER CLIP ROT 10 11.4 M/L (STAPLE)
APR CLP MED LRG 11.4X10 (STAPLE)
BAG SPEC RTRVL LRG 6X4 10 (ENDOMECHANICALS)
CANISTER SUCTION 2500CC (MISCELLANEOUS) ×2 IMPLANT
CATH KIT ON Q 5IN SLV (PAIN MANAGEMENT) IMPLANT
CATH THORACIC 28FR (CATHETERS) IMPLANT
CATH THORACIC 28FR RT ANG (CATHETERS) IMPLANT
CATH THORACIC 36FR (CATHETERS) IMPLANT
CATH THORACIC 36FR RT ANG (CATHETERS) IMPLANT
CLIP APPLIE ROT 10 11.4 M/L (STAPLE) IMPLANT
CLIP TI MEDIUM 6 (CLIP) IMPLANT
CONN Y 3/8X3/8X3/8  BEN (MISCELLANEOUS) ×1
CONN Y 3/8X3/8X3/8 BEN (MISCELLANEOUS) ×1 IMPLANT
CONT SPEC 4OZ CLIKSEAL STRL BL (MISCELLANEOUS) ×5 IMPLANT
DERMABOND ADHESIVE PROPEN (GAUZE/BANDAGES/DRESSINGS) ×1
DERMABOND ADVANCED .7 DNX6 (GAUZE/BANDAGES/DRESSINGS) IMPLANT
DRAPE LAPAROSCOPIC ABDOMINAL (DRAPES) ×2 IMPLANT
DRAPE WARM FLUID 44X44 (DRAPE) ×2 IMPLANT
ELECT BLADE 6.5 EXT (BLADE) ×1 IMPLANT
ELECT REM PT RETURN 9FT ADLT (ELECTROSURGICAL) ×2
ELECTRODE REM PT RTRN 9FT ADLT (ELECTROSURGICAL) ×1 IMPLANT
GLOVE BIO SURGEON STRL SZ 6.5 (GLOVE) ×3 IMPLANT
GLOVE BIOGEL PI IND STRL 6.5 (GLOVE) IMPLANT
GLOVE BIOGEL PI IND STRL 7.0 (GLOVE) IMPLANT
GLOVE BIOGEL PI INDICATOR 6.5 (GLOVE) ×2
GLOVE BIOGEL PI INDICATOR 7.0 (GLOVE) ×1
GLOVE SURG SIGNA 7.5 PF LTX (GLOVE) ×4 IMPLANT
GOWN PREVENTION PLUS XLARGE (GOWN DISPOSABLE) ×2 IMPLANT
GOWN STRL NON-REIN LRG LVL3 (GOWN DISPOSABLE) ×4 IMPLANT
HANDLE STAPLE ENDO GIA SHORT (STAPLE) ×1
HEMOSTAT SURGICEL 2X14 (HEMOSTASIS) IMPLANT
KIT BASIN OR (CUSTOM PROCEDURE TRAY) ×2 IMPLANT
KIT ROOM TURNOVER OR (KITS) ×2 IMPLANT
KIT SUCTION CATH 14FR (SUCTIONS) ×2 IMPLANT
NS IRRIG 1000ML POUR BTL (IV SOLUTION) ×4 IMPLANT
PACK CHEST (CUSTOM PROCEDURE TRAY) ×2 IMPLANT
PAD ARMBOARD 7.5X6 YLW CONV (MISCELLANEOUS) ×4 IMPLANT
POUCH ENDO CATCH II 15MM (MISCELLANEOUS) IMPLANT
POUCH SPECIMEN RETRIEVAL 10MM (ENDOMECHANICALS) IMPLANT
RELOAD EGIA 45 MED/THCK PURPLE (STAPLE) ×6 IMPLANT
RELOAD ENDO GIA 30 3.5 (STAPLE) ×2 IMPLANT
SEALANT PROGEL (MISCELLANEOUS) IMPLANT
SEALANT SURG COSEAL 4ML (VASCULAR PRODUCTS) IMPLANT
SEALANT SURG COSEAL 8ML (VASCULAR PRODUCTS) IMPLANT
SOLUTION ANTI FOG 6CC (MISCELLANEOUS) ×2 IMPLANT
SPECIMEN JAR MEDIUM (MISCELLANEOUS) ×2 IMPLANT
SPONGE GAUZE 4X4 12PLY (GAUZE/BANDAGES/DRESSINGS) ×2 IMPLANT
SPONGE INTESTINAL PEANUT (DISPOSABLE) IMPLANT
STAPLER ENDO GIA 12 SHRT THIN (STAPLE) IMPLANT
STAPLER ENDO GIA 12MM SHORT (STAPLE) ×1 IMPLANT
SUT PROLENE 4 0 RB 1 (SUTURE)
SUT PROLENE 4-0 RB1 .5 CRCL 36 (SUTURE) IMPLANT
SUT SILK  1 MH (SUTURE) ×2
SUT SILK 1 MH (SUTURE) ×2 IMPLANT
SUT SILK 2 0SH CR/8 30 (SUTURE) IMPLANT
SUT SILK 3 0SH CR/8 30 (SUTURE) IMPLANT
SUT VIC AB 1 CTX 36 (SUTURE) ×2
SUT VIC AB 1 CTX36XBRD ANBCTR (SUTURE) IMPLANT
SUT VIC AB 2-0 CTX 36 (SUTURE) ×1 IMPLANT
SUT VIC AB 2-0 UR6 27 (SUTURE) IMPLANT
SUT VIC AB 3-0 MH 27 (SUTURE) IMPLANT
SUT VIC AB 3-0 X1 27 (SUTURE) ×1 IMPLANT
SUT VICRYL 2 TP 1 (SUTURE) IMPLANT
SWAB COLLECTION DEVICE MRSA (MISCELLANEOUS) IMPLANT
SYSTEM SAHARA CHEST DRAIN ATS (WOUND CARE) ×2 IMPLANT
TAPE CLOTH 4X10 WHT NS (GAUZE/BANDAGES/DRESSINGS) ×2 IMPLANT
TIP APPLICATOR SPRAY EXTEND 16 (VASCULAR PRODUCTS) IMPLANT
TOWEL OR 17X24 6PK STRL BLUE (TOWEL DISPOSABLE) ×2 IMPLANT
TOWEL OR 17X26 10 PK STRL BLUE (TOWEL DISPOSABLE) ×4 IMPLANT
TRAP SPECIMEN MUCOUS 40CC (MISCELLANEOUS) IMPLANT
TRAY FOLEY CATH 14FRSI W/METER (CATHETERS) ×2 IMPLANT
TUBE ANAEROBIC SPECIMEN COL (MISCELLANEOUS) IMPLANT
TUNNELER SHEATH ON-Q 11GX8 DSP (PAIN MANAGEMENT) IMPLANT
WATER STERILE IRR 1000ML POUR (IV SOLUTION) ×4 IMPLANT

## 2013-08-11 NOTE — Brief Op Note (Addendum)
08/11/2013  9:05 AM  PATIENT:  Luisa Hart  67 y.o. female  PRE-OPERATIVE DIAGNOSIS:   INTERSTITIAL LUNG DISEASE  POST-OPERATIVE DIAGNOSIS:   INTERSTITIAL LUNG DISEASE  PROCEDURE:  Procedure(s) with comments:  VIDEO ASSISTED THORACOSCOPY (Left) -  -Wedge Resection LUL, LLL x 2   SURGEON:  Surgeon(s) and Role:    * Loreli Slot, MD - Primary  PHYSICIAN ASSISTANT: Lowella Dandy PA-C  ANESTHESIA:   general  EBL:     BLOOD ADMINISTERED:none  DRAINS: Left Pleural Chest tube   LOCAL MEDICATIONS USED:  NONE  SPECIMEN:  Source of Specimen:  Left Upper Lobe, Left Lower Lobe  DISPOSITION OF SPECIMEN:  PATHOLOGY  COUNTS:  YES  PLAN OF CARE: Admit to inpatient   PATIENT DISPOSITION:  PACU - hemodynamically stable.   Delay start of Pharmacological VTE agent (>24hrs) due to surgical blood loss or risk of bleeding: yes

## 2013-08-11 NOTE — Transfer of Care (Signed)
Immediate Anesthesia Transfer of Care Note  Patient: [name] [name]  Procedure(s) Performed: Procedure(s) with comments: VIDEO ASSISTED THORACOSCOPY (Left) - LEFT  VATS, LUNG BX  Patient Location: PACU  Anesthesia Type:General  Level of Consciousness: sedated  Airway & Oxygen Therapy: Patient Spontanous Breathing and Patient connected to nasal cannula oxygen  Post-op Assessment: Report given to PACU RN, Post -op Vital signs reviewed and stable and Patient moving all extremities  Post vital signs: Reviewed and stable  Complications: No apparent anesthesia complications

## 2013-08-11 NOTE — Anesthesia Preprocedure Evaluation (Addendum)
Anesthesia Evaluation  Patient identified by MRN, date of birth, ID band Patient awake    Reviewed: Allergy & Precautions, H&P , NPO status , Patient's Chart, lab work & pertinent test results  Airway Mallampati: I TM Distance: <3 FB Neck ROM: Full    Dental  (+) Edentulous Upper and Dental Advisory Given   Pulmonary shortness of breath and with exertion, former smoker,  breath sounds clear to auscultation        Cardiovascular + Peripheral Vascular Disease (s/p multiple vascular procedures on lower extremeties) Rhythm:Regular Rate:Normal     Neuro/Psych    GI/Hepatic   Endo/Other  Hypothyroidism   Renal/GU      Musculoskeletal   Abdominal   Peds  Hematology   Anesthesia Other Findings   Reproductive/Obstetrics                          Anesthesia Physical Anesthesia Plan  ASA: III  Anesthesia Plan: General   Post-op Pain Management:    Induction: Intravenous  Airway Management Planned: Double Lumen EBT  Additional Equipment: Arterial line and CVP  Intra-op Plan:   Post-operative Plan: Extubation in OR  Informed Consent: I have reviewed the patients History and Physical, chart, labs and discussed the procedure including the risks, benefits and alternatives for the proposed anesthesia with the patient or authorized representative who has indicated his/her understanding and acceptance.   Dental advisory given  Plan Discussed with: CRNA, Anesthesiologist and Surgeon  Anesthesia Plan Comments:         Anesthesia Quick Evaluation

## 2013-08-11 NOTE — Anesthesia Procedure Notes (Signed)
Procedure Name: Intubation Date/Time: 08/11/2013 7:49 AM Performed by: Charm Barges, Kileigh Ortmann R Pre-anesthesia Checklist: Patient identified, Emergency Drugs available, Suction available, Patient being monitored and Timeout performed Patient Re-evaluated:Patient Re-evaluated prior to inductionOxygen Delivery Method: Circle system utilized Preoxygenation: Pre-oxygenation with 100% oxygen Intubation Type: IV induction Ventilation: Mask ventilation without difficulty Laryngoscope Size: Mac and 3 Grade View: Grade I Endobronchial tube: Left, Double lumen EBT, EBT position confirmed by auscultation and EBT position confirmed by fiberoptic bronchoscope and 35 Fr Number of attempts: 1 Airway Equipment and Method: Stylet and Fiberoptic brochoscope Placement Confirmation: ETT inserted through vocal cords under direct vision,  positive ETCO2 and breath sounds checked- equal and bilateral Secured at: 29 cm Tube secured with: Tape Dental Injury: Teeth and Oropharynx as per pre-operative assessment

## 2013-08-11 NOTE — Anesthesia Postprocedure Evaluation (Signed)
  Anesthesia Post-op Note  Patient: [name] [name]  Procedure(s) Performed: Procedure(s) with comments: VIDEO ASSISTED THORACOSCOPY (Left) - LEFT  VATS, LUNG BX  Patient Location: PACU  Anesthesia Type:General  Level of Consciousness: awake, alert  and oriented  Airway and Oxygen Therapy: Patient Spontanous Breathing and Patient connected to nasal cannula oxygen  Post-op Pain: mild  Post-op Assessment: Post-op Vital signs reviewed, Patient's Cardiovascular Status Stable, Respiratory Function Stable, Patent Airway and Pain level controlled  Post-op Vital Signs: stable  Complications: No apparent anesthesia complications

## 2013-08-11 NOTE — Interval H&P Note (Signed)
History and Physical Interval Note:  08/11/2013 7:21 AM  [name] [name]  has presented today for surgery, with the diagnosis of INTERSITITAL LUNG DISEASE  The various methods of treatment have been discussed with the patient and family. After consideration of risks, benefits and other options for treatment, the patient has consented to  Procedure(s) with comments: VIDEO ASSISTED THORACOSCOPY (Left) - (L) VATS, LUNG BX as a surgical intervention .  The patient's history has been reviewed, patient examined, no change in status, stable for surgery.  I have reviewed the patient's chart and labs.  Questions were answered to the patient's satisfaction.     [name]

## 2013-08-11 NOTE — H&P (View-Only) (Signed)
PCP is Hoyle Sauer, MD Referring Provider is Avva, Serafina Royals, MD  Chief Complaint  Patient presents with  . Shortness of Breath    x 2 years...ILD.Marland Kitchen eval for VATS BX    HPI: 67 year old woman presents with chief complaint of shortness of breath.  Mrs. [name] [name] is a 67 year old woman who has been experiencing shortness of breath with exertion for the past couple of years. This has come on very gradually and only became noticeable earlier this year. She had a workup including a stress test, which was normal and a chest x-ray which showed cardiomegaly but no other abnormalities. She also had pulmonary function studies which showed mild to moderate restriction and severe decrease in diffusion capacity.   She saw Dr. Shelle Iron who did a CT of the chest which showed diffuse interstitial lung disease.  She says that her shortness of breath is been occurring for about 2 years. It has worsened over the past 6 months. She can walk for several blocks on level ground a normal pace, but she has to walk up an incline or increases her pace she does get short of breath quickly. It does resolve with rest. Not accompanied by any chest pain, pressure, or tightness. She does have a history of an aortobifemoral bypass and bilateral femoral popliteal bypasses. She has not had any recent problems related to that. She also has a history of hypothyroidism after treatment for Graves' disease, but is on Synthroid and is euthyroid at present. She does not have any known exposures to birds, travel, or mold or fungus.   Past Medical History  Diagnosis Date  . Peripheral arterial disease   . Hyperlipidemia   . GERD (gastroesophageal reflux disease)   . Thyroid disease     Hypothyroidism  . Hypothyroidism   . Blood transfusion     2 /12 yrs ago  . Arthritis   . Arrhythmia     when hyperthyroid  . Dyspnea on exertion 01/22/2013    Past Surgical History  Procedure Laterality Date  . Pr vein bypass  graft,aorto-fem-pop  03/02/09    Aortobifemoral BPG, Bilateral fem- popliteal BPG  . Hand surgery      cyst removed left hand  . Appendectomy      45 yrs ago  . Femoral-popliteal bypass graft  09/11/2011    Procedure: BYPASS GRAFT FEMORAL-POPLITEAL ARTERY;  Surgeon: Larina Earthly, MD;  Location: [facility] OR;  Service: Vascular;  Laterality: Right;  Thrombectomy right femoral popliteal bypass with intraoperative arteriogram  . Embolectomy  09/12/2011    Procedure: EMBOLECTOMY;  Surgeon: Juleen China, MD;  Location: [facility] OR;  Service: Vascular;  Laterality: Right;  Embolectomy Right Femoral -Popliteal Bypass Graft, Revision of Distal Anastomosis  . Femoral-popliteal bypass graft  09/11/2011    Procedure: BYPASS GRAFT FEMORAL-POPLITEAL ARTERY;  Surgeon: Larina Earthly, MD;  Location: [facility] OR;  Service: Vascular;  Laterality: Right;  Thrombectomy right femoral popliteal bypass with intraoperative arteriogram  . Breast surgery      duct removed left breast  . Femoral-popliteal bypass graft  01/17/2012    Procedure: BYPASS GRAFT FEMORAL-POPLITEAL ARTERY;  Surgeon: Larina Earthly, MD;  Location: [facility] OR;  Service: Vascular;  Laterality: Right;  right femoral to below knee popliteal bypass graft with vein  . Intraoperative arteriogram  01/17/2012    Procedure: INTRA OPERATIVE ARTERIOGRAM;  Surgeon: Larina Earthly, MD;  Location: [facility] OR;  Service: Vascular;  Laterality: Right;  . Abdominal hysterectomy    . Femoral-popliteal  bypass graft  06/17/2012    Procedure: BYPASS GRAFT FEMORAL-POPLITEAL ARTERY;  Surgeon: Larina Earthly, MD;  Location: [facility] OR;  Service: Vascular;  Laterality: Right;  revision    Family History  Problem Relation Age of Onset  . Anesthesia problems Neg Hx   . Hypotension Neg Hx   . Malignant hyperthermia Neg Hx   . Pseudochol deficiency Neg Hx   . COPD Mother   . Cancer Mother     BREAST  . Cancer Father     LUNG  . Cancer - Colon Brother   . Lymphoma Brother   . Cancer - Colon Sister      Social History History  Substance Use Topics  . Smoking status: Former Smoker -- 1.00 packs/day for 20 years    Types: Cigarettes    Quit date: 09/07/1999  . Smokeless tobacco: Never Used  . Alcohol Use: 4.2 oz/week    7 Glasses of wine per week     Comment: wine-- 5 glasses a week    Current Outpatient Prescriptions  Medication Sig Dispense Refill  . acetaminophen (TYLENOL) 500 MG tablet Take 500 mg by mouth daily as needed. For headache      . ALPRAZolam (XANAX) 0.25 MG tablet Take 0.25 mg by mouth at bedtime as needed. For anxiety/sleep      . aspirin EC 81 MG tablet Take 81 mg by mouth at bedtime.      Marland Kitchen CALCIUM PO Take 2,000 Units by mouth daily.      . Cholecalciferol 4000 UNITS CAPS Take 1 capsule by mouth daily.      Marland Kitchen levothyroxine (SYNTHROID, LEVOTHROID) 112 MCG tablet Take 112 mcg by mouth daily.       Marland Kitchen Phenylephrine-Acetaminophen (TYLENOL SINUS CONGESTION/PAIN PO) Take by mouth as directed.      . simvastatin (ZOCOR) 40 MG tablet Take 40 mg by mouth at bedtime.        No current facility-administered medications for this visit.    Allergies  Allergen Reactions  . Morphine And Related Nausea And Vomiting    Review of Systems  Constitutional: Negative for fever, chills, activity change, appetite change, fatigue and unexpected weight change.  HENT: Negative.   Respiratory: Positive for cough and shortness of breath (with exertion). Negative for wheezing.   Cardiovascular: Negative for chest pain.       History aortobifemoral bypass and bilateral femoral popliteal bypass, denies claudication  Gastrointestinal: Negative.   Endocrine:       Hypothyroid after treatment of Graves' disease, now on Synthroid  Genitourinary: Negative.   Musculoskeletal: Negative.   Neurological: Negative.   Hematological: Does not bruise/bleed easily.  All other systems reviewed and are negative.    BP 154/92  Pulse 108  Resp 16  Ht 5' (1.524 m)  Wt 120 lb (54.432 kg)  BMI  23.44 kg/m2  SpO2 95% Physical Exam  Vitals reviewed. Constitutional: She is oriented to person, place, and time. She appears well-developed and well-nourished. No distress.  HENT:  Head: Normocephalic and atraumatic.  Eyes: EOM are normal. Pupils are equal, round, and reactive to light.  Neck: Neck supple. No thyromegaly present.  Cardiovascular: Normal rate, regular rhythm, normal heart sounds and intact distal pulses.  Exam reveals no gallop and no friction rub.   No murmur heard. Pulmonary/Chest: Effort normal. She has rales (Bibasilar).  Abdominal: Soft. There is no tenderness.  Musculoskeletal: She exhibits no edema.  Lymphadenopathy:    She has no cervical adenopathy.  Neurological: She is alert and oriented to person, place, and time. No cranial nerve deficit.  Skin: Skin is warm and dry.     Diagnostic Tests: CT chest 05/23/2013 CT CHEST WITHOUT CONTRAST  Technique: Multidetector CT imaging of the chest was performed  following the standard protocol without IV contrast.  Comparison: No priors.  Findings:  Mediastinum: Heart size is normal. There is no significant  pericardial fluid, thickening or pericardial calcification. There  is atherosclerosis of the thoracic aorta, the great vessels of the  mediastinum and the coronary arteries, including calcified  atherosclerotic plaque in the left main, left anterior descending,  left circumflex and right coronary arteries. Mild calcifications of  the mitral annulus. Aberrant right subclavian artery (normal  anatomical variant) incidentally noted. Esophagus is unremarkable  in appearance. Numerous borderline enlarged mediastinal lymph  nodes are conspicuous in number rather in size. No pathologically  enlarged mediastinal or hilar lymph nodes. Please note that  accurate exclusion of hilar adenopathy is limited on noncontrast CT  scans.  Lungs/Pleura: High resolution images demonstrate patchy areas of  ground-glass  attenuation that are most pronounced in the lung bases  bilaterally. These are associated with intralobular septal  thickening, and thickening of the peribronchovascular interstitium.  These findings have a definite craniocaudal gradient, most severe  in the lung bases. There are associated fibrocavitary changes in  the lung bases with what appear to be several thick-walled cystic  areas and some mild cylindrical and varicose bronchiectasis. No  definite honeycombing is identified. Patchy areas of subpleural  reticulation are noted, but this is not a dominant pattern, and  there is no typical honeycombing identified at this time. 4 mm  nodule in the right lower lobe (image 31 of series 6). No other  larger more suspicious appearing pulmonary nodules or masses are  otherwise identified. No acute consolidative airspace disease. No  pleural effusions. Inspiratory and expiratory imaging demonstrates  some areas of lucency surrounded by ground-glass attenuation with  geographic margins on the expiratory phase imaging, suggesting some  mild air trapping.  Upper Abdomen: Diffusely decreased attenuation throughout the  hepatic parenchyma, compatible with hepatic steatosis. Focal fatty  sparing, particularly adjacent to the gallbladder fossa.  Atherosclerosis.  Musculoskeletal: There are no aggressive appearing lytic or blastic  lesions noted in the visualized portions of the skeleton.  IMPRESSION:  1. There are findings suggestive of an interstitial lung disease,  as detailed above. 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 Although there are some  fibrocavitary changes in the lung bases and there is a craniocaudal  gradient to these findings, the lack of a large regions of  subpleural reticulation and definite areas of typical honeycombing  makes usual interstitial pneumonia (UIP) a less likely   consideration. Some of these bibasilar fibrocavitary changes are  likely related to prior episodes of infection. Follow-up high-  resolution chest CT in 6-12 months would be useful to moderate for  temporal changes in the appearance of the lung parenchyma.  2. 4 mm right lower lobe nodule (image 31 of series 6) is  nonspecific, but warrants attention on follow-up studies. Given  the smoking related changes in the lungs, attention to this lesion  on the follow-up high-resolution chest CT obtained in 6-12 months  is recommended.  3. Atherosclerosis, including left main and three-vessel coronary  artery disease. Assessment for potential risk factor modification,  dietary therapy or  pharmacologic therapy may be warranted, if  clinically indicated.  4. Evidence of mild air trapping in the lungs bilaterally,  suggestive of mild small airways disease.  Original Report Authenticated By: Trudie Reed, M.D.   Impression: 67 year old woman with interstitial lung disease of uncertain etiology. She needs a lung biopsy to determine what type of interstitial disease she has in order to guide therapy.  I discussed with her in detail the nature of the procedure, including the incisions be used, need for general anesthesia, expected hospital stay, and overall recovery. We discussed the indications, risks, benefits, and alternatives. She understands that this is a diagnostic and not a therapeutic procedure. She understands that the risks include, but are not limited to, death, MI, DVT, PE, bleeding, possible need for transfusion, infection, prolonged air leak, cardiac arrhythmias, as well as other unforeseeable complications. She accepts the risks and wishes to proceed.  She wishes to wait and have surgery done after October 8.  Plan: Left VATS, lung biopsy on Monday, October 13.

## 2013-08-11 NOTE — Preoperative (Signed)
Beta Blockers   Reason not to administer Beta Blockers:Not Applicable 

## 2013-08-12 ENCOUNTER — Inpatient Hospital Stay (HOSPITAL_COMMUNITY): Payer: Medicare Other

## 2013-08-12 LAB — BASIC METABOLIC PANEL
BUN: 7 mg/dL (ref 6–23)
CO2: 25 mEq/L (ref 19–32)
Calcium: 8.3 mg/dL — ABNORMAL LOW (ref 8.4–10.5)
Chloride: 98 mEq/L (ref 96–112)
Creatinine, Ser: 0.68 mg/dL (ref 0.50–1.10)
GFR calc Af Amer: >90 mL/min (ref >90)
Glucose, Bld: 113 mg/dL — ABNORMAL HIGH (ref 70–99)

## 2013-08-12 LAB — BLOOD GAS, ARTERIAL
Acid-Base Excess: 1 mmol/L (ref 0.0–2.0)
Bicarbonate: 25.6 mEq/L — ABNORMAL HIGH (ref 20.0–24.0)
O2 Content: 2 L/min
O2 Saturation: 79.7 %
Patient temperature: 98.6
TCO2: 27 mmol/L (ref 0–100)
pH, Arterial: 7.372 (ref 7.350–7.450)

## 2013-08-12 LAB — CBC
MCH: 27.9 pg (ref 26.0–34.0)
MCHC: 33.2 g/dL (ref 30.0–36.0)
MCV: 84 fL (ref 78.0–100.0)
Platelets: 170 10*3/uL (ref 150–400)
RDW: 14.5 % (ref 11.5–15.5)
WBC: 12.8 10*3/uL — ABNORMAL HIGH (ref 4.0–10.5)

## 2013-08-12 MED ORDER — METOCLOPRAMIDE HCL 5 MG/ML IJ SOLN
10.0000 mg | Freq: Four times a day (QID) | INTRAMUSCULAR | Status: AC
  Administered 2013-08-12 – 2013-08-13 (×4): 10 mg via INTRAVENOUS
  Filled 2013-08-12 (×4): qty 2

## 2013-08-12 NOTE — Progress Notes (Signed)
Utilization review completed.  

## 2013-08-12 NOTE — Op Note (Signed)
[name], [name]                  ACCOUNT NO.:  [id number]  MEDICAL RECORD NO.:  [id number]  LOCATION:  3S02C                        FACILITY:  [facility]  PHYSICIAN:  Salvatore Decent. Dorris Fetch, M.D.DATE OF BIRTH:  [birth year]  DATE OF PROCEDURE:  08/11/2013 DATE OF DISCHARGE:                              OPERATIVE REPORT   PREOPERATIVE DIAGNOSIS:  Interstitial lung disease.  POSTOPERATIVE DIAGNOSIS:  Interstitial lung disease.  PROCEDURE:  Left video-assisted thoracoscopy with  wedge resection biopsies of the left upper lobe and left lower lobe x2.  SURGEON:  Salvatore Decent. Dorris Fetch, M.D.  ASSISTANT:  Lowella Dandy, PA-C.  ANESTHESIA:  General.  FINDINGS:  Lung firm with a vague nodularity.  Frozen section showed adequate tissue for diagnosis of interstitial lung disease.  CLINICAL NOTE:  [name] [name] is a 67 year old woman who has been experiencing progressive shortness of breath.  She had an evaluation including pulmonary function testing, which showed mild-to-moderate restriction and severe decrease in diffusion capacity, and a CT which showed diffuse interstitial lung disease.  She was referred for lung biopsy for diagnostic purposes.  The indications risks, benefits, and alternatives were discussed in detail with the patient.  She understood that this was a diagnostic and not therapeutic procedure.  Her CT did show a very small 4-mm nodule in the right lower lobe.  It was felt that this would be difficult to identify in the setting of interstitial lung disease and biopsies on that side might make it harder to follow the nodule; therefore, she was advised to have a left lung biopsy.  She understood the risk accepted them and agreed to proceed.  OPERATIVE NOTE:  [name] [name] was brought to the preoperative holding area on August 11, 2013.  There Anesthesia established central venous access and placed an arterial blood pressure monitoring line. Intravenous antibiotics were administered.   She was taken to the operating room, anesthetized, and intubated with a double-lumen endotracheal tube.  Sequential compression stockings were placed on the legs for DVT prophylaxis.  Foley catheter was placed.  She was placed in a right lateral decubitus position and the left chest was prepped and draped in the usual sterile fashion.  Single lung ventilation of the right lung was carried out and was tolerated well throughout the procedure.  An incision was made in approximately the eighth intercostal space in the midaxillary line, it was carried through the skin and subcutaneous tissue.  The chest was entered bluntly using a hemostat.  A port was inserted and the thoracoscope was placed in the chest.  A small utility incision was made in approximately the fifth intercostal space in the anterior axillary line.  No rib spreading was performed.  All work was performed through the small utility incision.  Initially, the lingula was grasped and the tip of the lingula was resected with sequential firings of an endoscopic GIA stapler.  It was sent for permanent pathology.  The lateral base of the lower lobe was grasped and a second wedge resection was performed again with sequential firings of an endoscopic GIA stapler.  The specimen was sent for frozen to ensure there was adequate and acceptable tissue for diagnosis of interstitial  lung disease.  While awaiting that result, a second biopsy was taken more posteriorly in the left lower lobe, again with sequential firings of an endoscopic GIA stapler.  There was good hemostasis at all staple lines. The frozen section returned with interstitial lung disease.  There was adequate tissue for diagnostic purposes.  A formal diagnosis will await the final pathologic evaluation.  Final inspection was made of the staple lines for hemostasis.  A 28- French chest tube was placed through the original port incision and secured with a #1 silk suture.  The  left lung was reinflated.  The small utility incision was closed with #1 Vicryl fascial suture followed by a 2-0 Vicryl subcutaneous suture and a 3-0 Vicryl subcuticular suture. All sponge, needle, and instrument counts were correct at the end of the procedure.  There were no intraoperative complications.  The patient was taken from the operating room to the postanesthetic care unit extubated and in good condition.     Salvatore Decent Dorris Fetch, M.D.     SCH/MEDQ  D:  08/11/2013  T:  08/12/2013  Job:  [id number]

## 2013-08-12 NOTE — Progress Notes (Signed)
Pt found by Nurse tech trying to walk to the bath room with out assistance. Pt returned back to bed with call bell in reach pt placed back on the monitor. Pt stated that she felt ok no change in pain or SOB. oncoming RN present for episode. Gave report to oncoming RN.

## 2013-08-12 NOTE — Progress Notes (Addendum)
[facility] DAILY ICU PROGRESS NOTE                   [address]            [place] [address]          [phone]   1 Day Post-Op Procedure(s) (LRB): VIDEO ASSISTED THORACOSCOPY (Left)  Total Length of Stay:  LOS: 1 day   Subjective: Feels pretty well overall  Objective: Vital signs in last 24 hours: Temp:  [97.6 F (36.4 C)-99.6 F (37.6 C)] 98.1 F (36.7 C) (10/14 0700) Pulse Rate:  [62-106] 96 (10/14 0440) Cardiac Rhythm:  [-] Normal sinus rhythm (10/14 0440) Resp:  [10-29] 29 (10/14 0440) BP: (103-160)/(39-114) 133/77 mmHg (10/14 0440) SpO2:  [87 %-100 %] 92 % (10/14 0440) Arterial Line BP: (115-202)/(40-67) 148/49 mmHg (10/14 0400) Weight:  [124 lb 3.2 oz (56.337 kg)] 124 lb 3.2 oz (56.337 kg) (10/13 2000)  Filed Weights   08/11/13 2000  Weight: 124 lb 3.2 oz (56.337 kg)    Weight change:    Hemodynamic parameters for last 24 hours:    Intake/Output from previous day: 10/13 0701 - 10/14 0700 In: 2770 [P.O.:720; I.V.:2000; IV Piggyback:50] Out: 1250 [Urine:950; Blood:50; Chest Tube:250]  Intake/Output this shift:    Current Meds: Scheduled Meds: . acetaminophen  1,000 mg Oral Q6H   Or  . acetaminophen (TYLENOL) oral liquid 160 mg/5 mL  1,000 mg Oral Q6H  . aspirin EC  81 mg Oral QHS  . bisacodyl  10 mg Oral Daily  . cefUROXime (ZINACEF)  IV  1.5 g Intravenous Q12H  . cholecalciferol  4,000 Units Oral Daily  . fentaNYL   Intravenous Q4H  . levalbuterol  0.63 mg Nebulization Q6H  . levothyroxine  112 mcg Oral QAC breakfast  . simvastatin  40 mg Oral QHS  . sodium chloride  10-40 mL Intracatheter Q12H   Continuous Infusions: . dextrose 5 % and 0.45% NaCl 100 mL/hr at 08/12/13 0632   PRN Meds:.ALPRAZolam, diphenhydrAMINE, diphenhydrAMINE, naloxone, ondansetron (ZOFRAN) IV, oxyCODONE, oxyCODONE-acetaminophen, potassium chloride, sodium chloride, sodium chloride  General appearance: alert, cooperative and no distress Heart: regular rate  and rhythm Lungs: clear to auscultation bilaterally Abdomen: benign Extremities: no edema Wound: dressings DRY/INTACT  Lab Results: CBC: Recent Labs  08/12/13 0440  WBC 12.8*  HGB 12.4  HCT 37.4  PLT 170   BMET:  Recent Labs  08/12/13 0440  NA 132*  K 4.2  CL 98  CO2 25  GLUCOSE 113*  BUN 7  CREATININE 0.68  CALCIUM 8.3*    PT/INR: No results found for this basename: LABPROT, INR,  in the last 72 hours Radiology: Dg Chest 2 View  08/11/2013   *RADIOLOGY REPORT*  Clinical Data: Preoperative evaluation for left lung biopsy.  CHEST - 2 VIEW  Comparison: CT of the chest May 23, 2013  Findings: Diffuse interstitial prominence, most apparent in the lung bases without superimposed pleural effusions or focal consolidations.  Cardiac silhouette appears mild to moderate enlarged, mediastinal silhouette is non suspicious. Pulmonary vasculature is unremarkable, no pneumothorax.  Soft tissue planes and included osseous structures are not suspicious.  IMPRESSION: Stable cardiomegaly and chronic interstitial changes without superimposed acute pulmonary process.   Original Report Authenticated By: Awilda Metro   Dg Chest Port 1 View  08/12/2013   CLINICAL DATA:  Post left VATS  EXAM: PORTABLE CHEST - 1 VIEW  COMPARISON:  08/11/2013  FINDINGS: Left chest tube and right central line  remain in place, unchanged. No visible pneumothorax. Diffuse airspace disease and cardiomegaly, unchanged. No effusions. No acute bony abnormality.  IMPRESSION: Stable diffuse bilateral airspace disease. Left chest tube without pneumothorax.   Electronically Signed   By: Charlett Nose M.D.   On: 08/12/2013 07:30   Dg Chest Portable 1 View  08/11/2013   CLINICAL DATA:  Postop left vats.  EXAM: PORTABLE CHEST - 1 VIEW  COMPARISON:  08/11/2013  FINDINGS: There is a right IJ catheter with tip in the cavoatrial junction. A left-sided chest tube is in place. No visible pneumothorax identified. Lung volumes are  decreased. There is diffuse bilateral hazy lung opacities suggestive of interstitial edema. Atelectasis and or consolidation noted within the left midlung and left base.  IMPRESSION: 1.  Left chest tube in place without pneumothorax.  2. Right IJ catheter tip is in the projection of the cavoatrial junction.  3. Diffuse pulmonary edema.   Electronically Signed   By: Signa Kell M.D.   On: 08/11/2013 10:27     Assessment/Plan: S/P Procedure(s) (LRB): VIDEO ASSISTED THORACOSCOPY (Left)  1 Doing well 2 D/C aline and foley 3 CT to H20 seal 4 decrease IVF 5 mobilize/pulm toilet- routine 6 ABG appears to be a venous sample- will not repeat at this time as clinically very stable, other labs-OK 7 cont PCA for now    GOLD,WAYNE E 08/12/2013 7:34 AM  Patient seen and examined. Agree with above but I do think the ABG was done from the a line and is accurate- supplemental O2 as needed CXR shows some gastric distention- will give reglan IV x 24 hours CT to water seal- hopefully can dc tomorrow

## 2013-08-13 ENCOUNTER — Encounter (HOSPITAL_COMMUNITY): Payer: Self-pay | Admitting: Thoracic Surgery (Cardiothoracic Vascular Surgery)

## 2013-08-13 ENCOUNTER — Inpatient Hospital Stay (HOSPITAL_COMMUNITY): Payer: Medicare Other

## 2013-08-13 LAB — COMPREHENSIVE METABOLIC PANEL
ALT: 13 U/L (ref 0–35)
AST: 18 U/L (ref 0–37)
Albumin: 2.7 g/dL — ABNORMAL LOW (ref 3.5–5.2)
Alkaline Phosphatase: 70 U/L (ref 39–117)
BUN: 4 mg/dL — ABNORMAL LOW (ref 6–23)
CO2: 27 mEq/L (ref 19–32)
Calcium: 7.9 mg/dL — ABNORMAL LOW (ref 8.4–10.5)
Chloride: 106 mEq/L (ref 96–112)
Creatinine, Ser: 0.55 mg/dL (ref 0.50–1.10)
GFR calc non Af Amer: >90 mL/min (ref >90)
Glucose, Bld: 139 mg/dL — ABNORMAL HIGH (ref 70–99)
Sodium: 141 mEq/L (ref 135–145)
Total Bilirubin: 0.3 mg/dL (ref 0.3–1.2)

## 2013-08-13 LAB — CBC
HCT: 34.7 % — ABNORMAL LOW (ref 36.0–46.0)
Hemoglobin: 11.5 g/dL — ABNORMAL LOW (ref 12.0–15.0)
MCH: 27.4 pg (ref 26.0–34.0)
MCV: 82.6 fL (ref 78.0–100.0)
Platelets: 143 10*3/uL — ABNORMAL LOW (ref 150–400)
RDW: 14 % (ref 11.5–15.5)

## 2013-08-13 MED ORDER — OXYCODONE HCL 5 MG PO TABS: 5.0000 mg | ORAL_TABLET | ORAL | Status: DC | PRN

## 2013-08-13 NOTE — Discharge Summary (Signed)
Physician Discharge Summary        [address] [place]       [place] [address]             [phone]    Patient ID: [name] [name] MRN: [id number] DOB/AGE: 29-Apr-1946 67 y.o.  Admit date: 08/11/2013 Discharge date: 08/13/2013  Admission Diagnoses: Patient Active Problem List   Diagnosis Date Noted  . Interstitial lung disease 07/04/2013  . Dyspnea on exertion 01/22/2013  . Hyperlipidemia   . Peripheral vascular disease, unspecified 07/09/2012  . Atherosclerosis of native arteries of the extremities with intermittent claudication 05/14/2012  . PAD (peripheral artery disease) 09/07/2011   Discharge Diagnoses:   Patient Active Problem List   Diagnosis Date Noted  . Interstitial lung disease 07/04/2013  . Dyspnea on exertion 01/22/2013  . Hyperlipidemia   . Peripheral vascular disease, unspecified 07/09/2012  . Atherosclerosis of native arteries of the extremities with intermittent claudication 05/14/2012  . PAD (peripheral artery disease) 09/07/2011   Discharged Condition: good  History of Present Illness:   Mrs. Hack is a 67 yo white female with complaints of shortness of breath.  She states this has occurred with exertion over the past couple of years, however over the past six months these symptoms have progressed.  She underwent medical workup with stress testing which showed no evidence of ischemia.  Chest x-ray obtained showed cardiomegaly but no significant abnormalities.  Pulmonary Function studies were performed which showed mild to moderate restriction and a severe decrease in diffusion capacity.  Patient was referred to Dr. Shelle Iron for further pulmonary workup.  The patient underwent CT scan which showed diffuse interstitial lung disease.  Therefore, she was referred to [facility] for surgical biopsy evaluation.  The patient was evaluated by Dr. Dorris Fetch on 07/15/2013 at which time it was decided the patient should undergo lung biopsy to determine the type of  interstitial lung disease present to help guide therapy.  The risks and benefits of the procedure were explained to the patient and she was agreeable to proceed.     Hospital Course:   Mrs. Lefeber presented to [facility] on 08/11/2013.  She was taken to the operating room and underwent Left Video Assisted Thoracoscopy with Wedge resection of her Left Upper Lobe and Left Lower Lobe.  These specimens were sent to Pathology.  The patient tolerated the procedure well, was extubated, and taken the PACU in stable condition.  The patient has done well post operatively.  Her chest tubes are in place with no evidence of air leak.  They were transitioned to water seal without the development of pneumothorax.  Her chest tube drainage has been low and her chest tube was removed without difficulty.  The patient has required high flow rates of oxygen but has been weaned down to 2L.  She continues to desaturate into the 60s with ambulation and home oxygen has been arranged.  The patient continues to do well.  She is ambulating without assistance.  She is tolerating a regular diet.  Her follow up chest xray is free from pneumothorax post chest tube removal.  Should no further issues arise we anticipate discharge home in the next 24-48 hours.  She will need to follow up with Dr. Dorris Fetch in 2 weeks with a CXR prior to her appointment.  She will also need to follow up with Dr. Shelle Iron as he feels necessary.  Final pathology remains pending    Significant Diagnostic Studies: radiology: CT scan:  IMPRESSION:  1. There are findings suggestive of an interstitial lung disease,  as detailed above. 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 Although there are some  fibrocavitary changes in the lung bases and there is a craniocaudal  gradient to these findings, the lack of a large regions of  subpleural reticulation and  definite areas of typical honeycombing  makes usual interstitial pneumonia (UIP) a less likely  consideration. Some of these bibasilar fibrocavitary changes are  likely related to prior episodes of infection. Follow-up high-  resolution chest CT in 6-12 months would be useful to moderate for  temporal changes in the appearance of the lung parenchyma.  2. 4 mm right lower lobe nodule (image 31 of series 6) is  nonspecific, but warrants attention on follow-up studies. Given  the smoking related changes in the lungs, attention to this lesion  on the follow-up high-resolution chest CT obtained in 6-12 months  is recommended.  3. Atherosclerosis, including left main and three-vessel coronary  artery disease. Assessment for potential risk factor modification,  dietary therapy or pharmacologic therapy may be warranted, if  clinically indicated.  4. Evidence of mild air trapping in the lungs bilaterally,  suggestive of mild small airways disease.  Treatments: surgery:   Left video-assisted thoracoscopy with wedge resection, biopsies of the left upper lobe and left lower lobe x2.  Disposition: 01-Home or Self Care  Discharge Medications:     Medication List         acetaminophen 500 MG tablet  Commonly known as:  TYLENOL  Take 500 mg by mouth daily as needed. For headache     ALPRAZolam 0.25 MG tablet  Commonly known as:  XANAX  Take 0.25 mg by mouth at bedtime as needed for sleep or anxiety. For anxiety/sleep     aspirin EC 81 MG tablet  Take 81 mg by mouth at bedtime.     CALCIUM PO  Take 2,000 Units by mouth daily.     Cholecalciferol 4000 UNITS Caps  Take 4,000 Units by mouth daily.     levothyroxine 112 MCG tablet  Commonly known as:  SYNTHROID, LEVOTHROID  Take 112 mcg by mouth daily.     oxyCODONE 5 MG immediate release tablet  Commonly known as:  Oxy IR/ROXICODONE  Take 1-2 tablets (5-10 mg total) by mouth every 3 (three) hours as needed (pain).     simvastatin  40 MG tablet  Commonly known as:  ZOCOR  Take 40 mg by mouth at bedtime.         Future Appointments Provider Department Dept Phone   01/20/2014 9:30 AM [facility] [place] [facility] [address] [phone]   01/20/2014 10:00 AM [facility] [place] [facility] [address] [phone]   01/20/2014 11:00 AM [facility] [address] [phone]   01/20/2014 12:00 PM Carma Lair Nickel, NP [facility] [phone]          Follow-up Information   Follow up with Loreli Slot, MD.   Specialty:  Cardiothoracic Surgery   Contact information:   [address] [place] [address] [phone]       Follow up with [facility].   Contact information:   [place]       Signed: Keilana Morlock 08/13/2013, 8:48 AM

## 2013-08-13 NOTE — Clinical Documentation Improvement (Signed)
THIS DOCUMENT IS NOT A PERMANENT PART OF THE MEDICAL RECORD  Please update your documentation within the medical record to reflect your response to this query. If you need help knowing how to do this please call [phone].  08/13/13  Dear Denny Peon,  In a better effort to capture your patient's severity of illness, reflect appropriate length of stay and utilization of resources, a review of the medical record has revealed the following indicators.   Based on your clinical judgment, please clarify and document in a progress note and/or discharge summary the clinical condition associated with the following supporting information: In responding to this query please exercise your independent judgment.  The fact that a query is asked, does not imply that any particular answer is desired or expected.   Hello Denny Peon!  Abnormal findings (laboratory, x-ray, MRI/CT scans, and other diagnostic results) are not coded and reported unless the physician indicates their clinical significance. The medical record reflects the following clinical findings. If possible, please help by clarifying any diagnosis related to these abnormal findings. Thank you!   DG CHEST PORT 1 VIEW 08/13/13 FINDINGS:There is little change in aeration with little change to slight worsening of airspace disease diffusely. A left chest tube remains and there is a persistent small left apical &pneumothorax. Left chest wall subcutaneous air is present as well. IMPRESSION:Little change in small left apical &pneumothorax with left chest tube remaining. Diffuse airspace disease remains.   Possible Clinical Conditions?  - Small Pneumothorax  - Other condition (please document in the progress notes and/or discharge summary)  - Cannot Clinically determine at this time       additional documentation in chart upon review. SM    Thank You,  Saul Fordyce  Clinical Documentation Specialist: [phone] Health Information  Management [place]

## 2013-08-13 NOTE — Progress Notes (Addendum)
      [address]       [name] [address]             [phone]      2 Days Post-Op Procedure(s) (LRB): VIDEO ASSISTED THORACOSCOPY (Left)  Subjective:  Patient is without complaints this morning.  States she wants to go home.  I explained to the patient that at the earliest she could possibly go home tomorrow.  She remains on high amount of oxygen at 6L via [place]. No BM  Objective: Vital signs in last 24 hours: Temp:  [98 F (36.7 C)-98.5 F (36.9 C)] 98.5 F (36.9 C) (10/15 0315) Pulse Rate:  [64-80] 73 (10/15 0315) Cardiac Rhythm:  [-] Normal sinus rhythm (10/15 0315) Resp:  [13-20] 13 (10/15 0315) BP: (87-138)/(39-61) 92/53 mmHg (10/15 0315) SpO2:  [87 %-96 %] 95 % (10/15 0315) FiO2 (%):  [97 %] 97 % (10/15 0217)  Intake/Output from previous day: 10/14 0701 - 10/15 0700 In: 1559.6 [P.O.:240; I.V.:1319.6] Out: 2940 [Urine:2700; Chest Tube:240]  General appearance: alert, cooperative and no distress Heart: regular rate and rhythm Lungs: clear to auscultation bilaterally Abdomen: soft, non-tender; bowel sounds normal; no masses,  no organomegaly Extremities: edema trace Wound: clean and dry  Lab Results:  Recent Labs  08/12/13 0440 08/13/13 0425  WBC 12.8* 8.3  HGB 12.4 11.5*  HCT 37.4 34.7*  PLT 170 143*   BMET:  Recent Labs  08/12/13 0440 08/13/13 0425  NA 132* 141  K 4.2 4.0  CL 98 106  CO2 25 27  GLUCOSE 113* 139*  BUN 7 4*  CREATININE 0.68 0.55  CALCIUM 8.3* 7.9*    PT/INR: No results found for this basename: LABPROT, INR,  in the last 72 hours ABG    Component Value Date/Time   PHART 7.372 08/12/2013 0440   HCO3 25.6* 08/12/2013 0440   TCO2 27.0 08/12/2013 0440   ACIDBASEDEF 3.1* 08/07/2013 1347   O2SAT 79.7 08/12/2013 0440   CBG (last 3)  No results found for this basename: GLUCAP,  in the last 72 hours  Assessment/Plan: S/P Procedure(s) (LRB): VIDEO ASSISTED THORACOSCOPY (Left)  1. Chest tube- on water seal,  no evidence of air leak, 240 cc output yesterday- CXR shows no evidence of Pneumothorax, stable appearance of bilateral air space disease- management per staff 2. Pulm- wean oxygen as tolerated, encouraged use of IS, continue nebs 3. Place IV fluids to KVO 4. Dispo- patient stable, wean oxygen as tolerated, possibly remove final chest tube today   LOS: 2 days    BARRETT, ERIN 08/13/2013  Feels well No air leak- will dc CT Change from PCA to PO pain meds Probably home tomorrow

## 2013-08-13 NOTE — Progress Notes (Signed)
Fentynal wis 26 mcg verified by Arlice Colt

## 2013-08-13 NOTE — Progress Notes (Signed)
Pt CT d/c per MD order, pt tol well, pt educated to call nursing staff for SOB, CP, any changes from baseline

## 2013-08-14 ENCOUNTER — Inpatient Hospital Stay (HOSPITAL_COMMUNITY): Payer: Medicare Other

## 2013-08-14 MED ORDER — OXYCODONE HCL 5 MG PO TABS: 5.0000 mg | ORAL_TABLET | ORAL | Status: DC | PRN

## 2013-08-14 MED ORDER — LEVALBUTEROL HCL 0.63 MG/3ML IN NEBU: 0.6300 mg | INHALATION_SOLUTION | Freq: Four times a day (QID) | RESPIRATORY_TRACT | Status: DC | PRN

## 2013-08-14 NOTE — Care Management Note (Signed)
    Page 1 of 1   08/14/2013     12:25:44 PM   CARE MANAGEMENT NOTE 08/14/2013  Patient:  SANYLA, SUMMEY   Account Number:  [id number]  Date Initiated:  08/12/2013  Documentation initiated by:  Donn Pierini  Subjective/Objective Assessment:   Pt admitted s/p VATS     Action/Plan:   PTA pt lived at home- NCM to follow post op progress for d/c needs   Anticipated DC Date:  08/14/2013   Anticipated DC Plan:  HOME W HOME HEALTH SERVICES      DC Planning Services  CM consult      [facility]  DURABLE MEDICAL EQUIPMENT   Choice offered to / List presented to:     DME arranged  [facility]      DME agency  [facility]        Status of service:  Completed, signed off Medicare Important Message given?   (If response is "NO", the following Medicare IM given date fields will be blank) Date Medicare IM given:   Date Additional Medicare IM given:    Discharge Disposition:  HOME/SELF CARE  Per UR Regulation:  Reviewed for med. necessity/level of care/duration of stay  If discussed at Long Length of Stay Meetings, dates discussed:    Comments:  08/14/13- 1000- Donn Pierini RN, BSN [phone] Pt for d/c home today- orders for home 02- spoke with Derrian with [facility] regarding DME need for home 02- will look into if pt qualifies for 02. [facility] delivered 02 tank to room for home 02-

## 2013-08-14 NOTE — Progress Notes (Signed)
Pt d/c home per MD order, pt VSS, pt d/c home on 2L o2, family at [facility], d/c instructions given, script given, pt and family verbalized understanding of tx, all questions answered

## 2013-08-14 NOTE — Discharge Planning (Signed)
SATURATION QUALIFICATIONS: (This note is used to comply with regulatory documentation for home oxygen)  Patient Saturations on Room Air at Rest =86   Patient Saturations on Room Air while Ambulating = 59 Patient Saturations on2  Liters of oxygen while Ambulating =92%  Please briefly explain why patient needs home oxygen:

## 2013-08-14 NOTE — Progress Notes (Addendum)
      [address]       [place] [address]             [phone]      3 Days Post-Op Procedure(s) (LRB): VIDEO ASSISTED THORACOSCOPY (Left)  Subjective:  [name] [name] feels great this morning.  She states she is ready to go home today. + BM  Objective: Vital signs in last 24 hours: Temp:  [97.3 F (36.3 C)-99.1 F (37.3 C)] 98.8 F (37.1 C) (10/16 0403) Pulse Rate:  [80-92] 82 (10/16 0403) Cardiac Rhythm:  [-] Normal sinus rhythm (10/15 1945) Resp:  [14-18] 17 (10/16 0403) BP: (100-130)/(54-81) 104/70 mmHg (10/16 0403) SpO2:  [95 %-99 %] 97 % (10/16 0403)  Intake/Output from previous day: 10/15 0701 - 10/16 0700 In: 1392 [P.O.:1080; I.V.:312] Out: 500 [Urine:500]  General appearance: alert, cooperative and no distress Heart: regular rate and rhythm Lungs: clear to auscultation bilaterally Abdomen: soft, non-tender; bowel sounds normal; no masses,  no organomegaly Wound: clean and dry  Lab Results:  Recent Labs  08/12/13 0440 08/13/13 0425  WBC 12.8* 8.3  HGB 12.4 11.5*  HCT 37.4 34.7*  PLT 170 143*   BMET:  Recent Labs  08/12/13 0440 08/13/13 0425  NA 132* 141  K 4.2 4.0  CL 98 106  CO2 25 27  GLUCOSE 113* 139*  BUN 7 4*  CREATININE 0.68 0.55  CALCIUM 8.3* 7.9*    PT/INR: No results found for this basename: LABPROT, INR,  in the last 72 hours ABG    Component Value Date/Time   PHART 7.372 08/12/2013 0440   HCO3 25.6* 08/12/2013 0440   TCO2 27.0 08/12/2013 0440   ACIDBASEDEF 3.1* 08/07/2013 1347   O2SAT 79.7 08/12/2013 0440   CBG (last 3)  No results found for this basename: GLUCAP,  in the last 72 hours  Assessment/Plan: S/P Procedure(s) (LRB): VIDEO ASSISTED THORACOSCOPY (Left)  1. Chest tube removed yesterday- CXR stable, trace right apical pneumothorax remains present 2. Pulm- patient becomes hypoxic with ambulation, sats dropped to the 60s, but improved immediately with oxygen 3. Final Pathology remains pending 4.  Dispo- patient doing very well, will d/c home today with home oxygen  LOS: 3 days    [name], [name] 08/14/2013  patient seen and examined, agree with above

## 2013-08-14 NOTE — Progress Notes (Signed)
Room air resting sat 86%, ambulation on room air 59%, resting on 2L 92%, ambulation on 2L 90% - MD ordering home 02

## 2013-08-14 NOTE — Progress Notes (Signed)
Case Management order placed for home O2, N/PA Barrett of desats on RA of pt, Verbal order given for home O2.

## 2013-08-19 ENCOUNTER — Other Ambulatory Visit: Payer: Self-pay | Admitting: *Deleted

## 2013-08-19 DIAGNOSIS — J8410 Pulmonary fibrosis, unspecified: Secondary | ICD-10-CM

## 2013-08-21 ENCOUNTER — Ambulatory Visit (INDEPENDENT_AMBULATORY_CARE_PROVIDER_SITE_OTHER): Payer: Self-pay | Admitting: *Deleted

## 2013-08-21 DIAGNOSIS — D381 Neoplasm of uncertain behavior of trachea, bronchus and lung: Secondary | ICD-10-CM

## 2013-08-21 DIAGNOSIS — J849 Interstitial pulmonary disease, unspecified: Secondary | ICD-10-CM

## 2013-08-21 DIAGNOSIS — Z4802 Encounter for removal of sutures: Secondary | ICD-10-CM

## 2013-08-21 NOTE — Progress Notes (Signed)
[name] [name] returns s/p L VATS/LUNG BXS for removal of one chest tube  suture.  This site as well as the lateral mini thoracotomy incision are very well healed.  She has no complaints. She is using oxygen that was prescribed on discharge but feels that she really does not need it.  I told her that Dr. Shelle Iron will evaluate her on follow up and make that decision.  She agrees.  We will see her as scheduled with a chest xray.

## 2013-09-02 ENCOUNTER — Ambulatory Visit
Admission: RE | Admit: 2013-09-02 | Discharge: 2013-09-02 | Disposition: A | Discharge status: Home / Self Care | Payer: 59 | Source: Ambulatory Visit | Attending: Thoracic Surgery (Cardiothoracic Vascular Surgery) | Admitting: Thoracic Surgery (Cardiothoracic Vascular Surgery)

## 2013-09-02 ENCOUNTER — Ambulatory Visit (INDEPENDENT_AMBULATORY_CARE_PROVIDER_SITE_OTHER): Payer: Self-pay | Admitting: Thoracic Surgery (Cardiothoracic Vascular Surgery)

## 2013-09-02 ENCOUNTER — Encounter: Payer: Self-pay | Admitting: Thoracic Surgery (Cardiothoracic Vascular Surgery)

## 2013-09-02 VITALS — BP 175/56 | HR 72 | Resp 20 | Ht 60.0 in | Wt 124.0 lb

## 2013-09-02 DIAGNOSIS — Z09 Encounter for follow-up examination after completed treatment for conditions other than malignant neoplasm: Secondary | ICD-10-CM

## 2013-09-02 DIAGNOSIS — Z9889 Other specified postprocedural states: Secondary | ICD-10-CM

## 2013-09-02 DIAGNOSIS — J8410 Pulmonary fibrosis, unspecified: Secondary | ICD-10-CM

## 2013-09-02 DIAGNOSIS — J849 Interstitial pulmonary disease, unspecified: Secondary | ICD-10-CM

## 2013-09-02 DIAGNOSIS — G8918 Other acute postprocedural pain: Secondary | ICD-10-CM

## 2013-09-02 MED ORDER — OXYCODONE HCL 5 MG PO TABS: 5.0000 mg | ORAL_TABLET | ORAL | Status: DC | PRN

## 2013-09-02 NOTE — Progress Notes (Signed)
HPI:  [name] [name] returns today for a postoperative followup visit.  She had a left thoracoscopic lung biopsy for interstitial lung disease on 08/11/2013. She did well postoperatively and was discharged home on postoperative day #3. She was desaturating with activity was sent home with home oxygen. She says that her breathing has been relatively stable and she does not feel like she needs the oxygen anymore.  She still having some pain and is requesting additional pain medication. She mainly needs it at night before she goes to bed. Most of the time during the day she is fairly comfortable.  Past Medical History  Diagnosis Date  . Peripheral arterial disease   . Hyperlipidemia   . Thyroid disease     Hypothyroidism  . Hypothyroidism   . Blood transfusion     2 /12 yrs ago  . Arthritis   . Arrhythmia     when hyperthyroid  . Dyspnea on exertion 01/22/2013  . GERD (gastroesophageal reflux disease)     otc      Current Outpatient Prescriptions  Medication Sig Dispense Refill  . acetaminophen (TYLENOL) 500 MG tablet Take 500 mg by mouth daily as needed. For headache      . ALPRAZolam (XANAX) 0.25 MG tablet Take 0.25 mg by mouth at bedtime as needed for sleep or anxiety. For anxiety/sleep      . aspirin EC 81 MG tablet Take 81 mg by mouth at bedtime.      Marland Kitchen CALCIUM PO Take 2,000 Units by mouth daily.      . Cholecalciferol 4000 UNITS CAPS Take 4,000 Units by mouth daily.       Marland Kitchen levothyroxine (SYNTHROID, LEVOTHROID) 112 MCG tablet Take 112 mcg by mouth daily.       Marland Kitchen oxyCODONE (OXY IR/ROXICODONE) 5 MG immediate release tablet Take 1-2 tablets (5-10 mg total) by mouth every 3 (three) hours as needed (pain).  30 tablet  0  . simvastatin (ZOCOR) 40 MG tablet Take 40 mg by mouth at bedtime.        No current facility-administered medications for this visit.    Physical Exam BP 175/56  Pulse 72  Resp 20  Ht 5' (1.524 m)  Wt 124 lb (56.246 kg)  BMI 24.22 kg/m2  SpO11  1% 67 year old woman in no acute distress Nasal cannula oxygen in place Lungs dry crackles at bases Incision and chest tube site healing well  Diagnostic Tests: Chest x-ray 09/02/2013  CHEST 2 VIEW  COMPARISON: Chest x-ray of 08/14/2013  FINDINGS:  Prominent interstitial markings remain diffusely throughout the  lungs. No focal infiltrate or effusion is seen. Mediastinal contours  are stable. The heart is mildly enlarged and stable. No bony  abnormality is seen.  IMPRESSION:  Stable chronic interstitial lung disease. No active process.  Electronically Signed  By: Dwyane Dee M.D.  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   Overall she's doing well. She probably does not need the oxygen anymore but I just asked her to wait until she sees Dr. Shelle Iron tomorrow to make sure he is okay with discontinuing it.  She still having some incisional discomfort which is not unexpected. I gave her a prescription for oxycodone 5 mg tablets, one to 2 tablets twice daily as needed for pain, dispense 30, no refills.  Her activities are unrestricted, but she should build into new activities gradually. She  should not drive within 6 hours of taking a pain pill.  Plan: She will see Dr. Shelle Iron tomorrow to discuss the diagnosis and treatment.  I will be happy to see her back any time the future if I can be of any further assistance with her care

## 2013-09-03 ENCOUNTER — Encounter: Payer: Self-pay | Admitting: Pulmonary Disease

## 2013-09-03 ENCOUNTER — Ambulatory Visit (INDEPENDENT_AMBULATORY_CARE_PROVIDER_SITE_OTHER): Payer: Medicare Other | Admitting: Pulmonary Disease

## 2013-09-03 ENCOUNTER — Other Ambulatory Visit: Payer: 59

## 2013-09-03 VITALS — BP 150/70 | HR 84 | Temp 98.0°F | Ht 60.0 in | Wt 117.8 lb

## 2013-09-03 DIAGNOSIS — Z23 Encounter for immunization: Secondary | ICD-10-CM

## 2013-09-03 DIAGNOSIS — J849 Interstitial pulmonary disease, unspecified: Secondary | ICD-10-CM

## 2013-09-03 DIAGNOSIS — J8410 Pulmonary fibrosis, unspecified: Secondary | ICD-10-CM

## 2013-09-03 MED ORDER — PREDNISONE 20 MG PO TABS: 40.0000 mg | ORAL_TABLET | Freq: Every day | ORAL | Status: DC

## 2013-09-03 NOTE — Progress Notes (Signed)
  Subjective:    Patient ID: [name] [name], female    DOB: 1946-07-21, 67 y.o.   MRN: [id number]  HPI Patient comes in today for followup after her recent thoracoscopic lung biopsy for pulmonary fibrosis.  She has done fairly well since the biopsy, but did go home on supplemental oxygen.  She feels that her breathing is slowly returning to baseline, but has noted that she continues to desaturate with heavier exertional activities on room air.  She denies any significant cough or mucus production.  Her pathology was very complicated, and was not consistent with UIP.  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  This certainly raises the question of chronic hypersensitivity pneumonitis, but the patient denies any exposures associated with this disease.  I have reviewed the biopsy results with her in detail, and answered all of hers and her daughter's questions.   Review of Systems  Constitutional: Negative for fever and unexpected weight change.  HENT: Negative for congestion, dental problem, ear pain, nosebleeds, postnasal drip, rhinorrhea, sinus pressure, sneezing, sore throat and trouble swallowing.   Eyes: Negative for redness and itching.  Respiratory: Negative for cough, chest tightness, shortness of breath and wheezing.   Cardiovascular: Negative for palpitations and leg swelling.  Gastrointestinal: Negative for nausea and vomiting.  Genitourinary: Negative for dysuria.  Musculoskeletal: Negative for joint swelling.  Skin: Negative for rash.  Neurological: Negative for headaches.  Hematological: Does not bruise/bleed easily.  Psychiatric/Behavioral: Negative for dysphoric mood. The patient is not nervous/anxious.        Objective:   Physical Exam Well-developed female in no acute distress Nose without purulence or discharge noted Neck without lymphadenopathy or thyromegaly Chest with mild basilar crackles,  otherwise clear Cardiac exam was regular rate and rhythm Lower extremities without edema, no cyanosis Alert and oriented, moves all 4 extremities.        Assessment & Plan:

## 2013-09-03 NOTE — Patient Instructions (Addendum)
Will start on prednisone at 40mg  each am on full stomach. Stay on oxygen at 2 liters with heavier exertional activities.  Do not need at rest. Please check home and work environment for exposures Will check bloodwork for hypersensitivity pneumonitis. followup with me in 4 weeks.

## 2013-09-03 NOTE — Assessment & Plan Note (Signed)
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  I have explained to her that cellular biopsies portend a better prognosis than very fibrotic pathology.  However, fibrosing NSIP is usually somewhere in between regular NSIP and UIP with respect to prognosis.  It is often associated with an autoimmune process, however her autoimmune labs were unremarkable.  She also has no symptoms consistent with autoimmune disease.  She understands the fibrotic lung disease can predate the clinical manifestations of connective tissue disease by years.  Given multinucleated giant cells on the biopsy, I think we also need to consider the possibility of chronic hypersensitivity pneumonitis.  I have reviewed an exposure history with her, with no obvious source.  I have asked her to do an environmental survey of her home and workplace, and we'll also send hypersensitivity serologies.  I would like to start her on prednisone to see if she has a good clinical response.

## 2013-09-08 ENCOUNTER — Ambulatory Visit: Payer: Medicare Other | Admitting: Pulmonary Disease

## 2013-09-12 LAB — HYPERSENSITIVITY PNUEMONITIS PROFILE

## 2013-09-30 ENCOUNTER — Other Ambulatory Visit: Payer: Self-pay | Admitting: Pulmonary Disease

## 2013-09-30 NOTE — Telephone Encounter (Signed)
Pt has follow up appt with [facility] (4wks f/u) refilled pred up to that point -[facility]

## 2013-10-09 ENCOUNTER — Encounter: Payer: Self-pay | Admitting: Pulmonary Disease

## 2013-10-09 ENCOUNTER — Other Ambulatory Visit: Payer: Self-pay | Admitting: Pulmonary Disease

## 2013-10-09 ENCOUNTER — Ambulatory Visit (INDEPENDENT_AMBULATORY_CARE_PROVIDER_SITE_OTHER): Payer: Medicare Other | Admitting: Pulmonary Disease

## 2013-10-09 VITALS — BP 128/70 | HR 82 | Temp 97.0°F | Ht 60.0 in | Wt 122.0 lb

## 2013-10-09 DIAGNOSIS — J849 Interstitial pulmonary disease, unspecified: Secondary | ICD-10-CM

## 2013-10-09 DIAGNOSIS — J8410 Pulmonary fibrosis, unspecified: Secondary | ICD-10-CM

## 2013-10-09 MED ORDER — PREDNISONE 10 MG PO TABS: 10.0000 mg | ORAL_TABLET | ORAL | Status: DC

## 2013-10-09 NOTE — Patient Instructions (Signed)
Decrease prednisone to 30mg  each day for 2 weeks, then decrease to 20mg  each day for 2 weeks, then decrease to 15mg  a day and stay there.  If you have increased symptoms during the weaning process, please call us.  Will arrange for prednisone 10mg  tabs as well that can be split.   Will discontinue oxygen if you are able to keep oxygen level up with brisk walking. followup with me again in 8 weeks, but call if questions or problems.

## 2013-10-09 NOTE — Progress Notes (Signed)
   Subjective:    Patient ID: [name] [name], female    DOB: 01/16/1946, 67 y.o.   MRN: [id number]  HPI Patient comes in today for followup of her interstitial lung disease, secondary to NSIP. by lung biopsy.  She has had a completely negative autoimmune panel, and her hypersensitivity panel was unremarkable except for a slight increase signal for Aspergillus. She has looked closely at her home and working environment, with no obvious source from mold. She has no unusual pets or hobbies.  She has been started on a prednisone taper with a significant improvement in her breathing. Her cough has totally resolved as well. She has had no significant issues from the prednisone except disruption of sleep.   Review of Systems  Constitutional: Negative for fever and unexpected weight change.  HENT: Negative for congestion, dental problem, ear pain, nosebleeds, postnasal drip, rhinorrhea, sinus pressure, sneezing, sore throat and trouble swallowing.   Eyes: Negative for redness and itching.  Respiratory: Negative for cough, chest tightness, shortness of breath and wheezing.   Cardiovascular: Negative for palpitations and leg swelling.  Gastrointestinal: Negative for nausea and vomiting.  Genitourinary: Negative for dysuria.  Musculoskeletal: Negative for joint swelling.  Skin: Negative for rash.  Neurological: Negative for headaches.  Hematological: Does not bruise/bleed easily.  Psychiatric/Behavioral: Negative for dysphoric mood. The patient is not nervous/anxious.        Objective:   Physical Exam Well-developed female in no acute distress Nose without purulence or discharge noted Neck without lymphadenopathy or thyromegaly Chest with clear breath sounds, surprisingly no crackles. Cardiac exam with regular rate and rhythm Lower extremities without edema, no cyanosis Alert and oriented, moves all 4 extremities.       Assessment & Plan:

## 2013-10-09 NOTE — Addendum Note (Signed)
Addended by: Maisie Fus on: 10/09/2013 01:49 PM   Modules accepted: Orders

## 2013-10-09 NOTE — Assessment & Plan Note (Signed)
The patient has had a dramatic response to prednisone, with significant improvement in her symptoms. She has looked closely at her home and working environment, with no evidence for significant mold. It remains unclear whether this is chronic hypersensitivity pneumonitis, or whether she has an autoimmune process that has not been identified as of yet. I would like to slowly wean her prednisone over time, and try and get her to a dose below 20 mg a day. If we are unable to do so, would consider adding either Imuran or CellCept. Will also check her angulatory saturations today to see if we can discontinue oxygen.

## 2013-10-10 ENCOUNTER — Other Ambulatory Visit: Payer: Self-pay | Admitting: *Deleted

## 2013-10-10 DIAGNOSIS — J8410 Pulmonary fibrosis, unspecified: Secondary | ICD-10-CM

## 2013-10-13 ENCOUNTER — Ambulatory Visit
Admission: RE | Admit: 2013-10-13 | Discharge: 2013-10-13 | Disposition: A | Discharge status: Home / Self Care | Payer: 59 | Source: Ambulatory Visit | Attending: Thoracic Surgery (Cardiothoracic Vascular Surgery) | Admitting: Thoracic Surgery (Cardiothoracic Vascular Surgery)

## 2013-10-13 ENCOUNTER — Ambulatory Visit (INDEPENDENT_AMBULATORY_CARE_PROVIDER_SITE_OTHER): Payer: Self-pay | Admitting: Physician Assistant

## 2013-10-13 VITALS — BP 163/69 | HR 83 | Resp 20 | Ht 60.0 in | Wt 122.0 lb

## 2013-10-13 DIAGNOSIS — J849 Interstitial pulmonary disease, unspecified: Secondary | ICD-10-CM

## 2013-10-13 DIAGNOSIS — G8918 Other acute postprocedural pain: Secondary | ICD-10-CM

## 2013-10-13 DIAGNOSIS — J8410 Pulmonary fibrosis, unspecified: Secondary | ICD-10-CM

## 2013-10-13 NOTE — Progress Notes (Signed)
  HPI: Patient returns for routine postoperative follow-up having undergone Left Upper Lobe and Left lower lobe Lung Biopsy on 08/11/2013.  The patient did well post operatively.  She presented for post operative follow up with Dr. Dorris Fetch on 09/02/2013 at which time patient was doing well and was instructed to return to the office as needed.  However, approximately 1 week ago the patient developed a bump below her old chest tube site.  She states this area was extremely tender and she was concerned.  There has been no drainage from the surgical wound and currently patient is pain free as the bump has resolved, but she felt she should still come in.   Current Outpatient Prescriptions  Medication Sig Dispense Refill  . acetaminophen (TYLENOL) 500 MG tablet Take 500 mg by mouth daily as needed. For headache      . ALPRAZolam (XANAX) 0.25 MG tablet Take 0.25 mg by mouth at bedtime as needed for sleep or anxiety. For anxiety/sleep      . aspirin EC 81 MG tablet Take 81 mg by mouth at bedtime.      Marland Kitchen CALCIUM PO Take 2,000 Units by mouth daily.      . Cholecalciferol 4000 UNITS CAPS Take 4,000 Units by mouth daily.       Marland Kitchen levothyroxine (SYNTHROID, LEVOTHROID) 112 MCG tablet Take 112 mcg by mouth daily.       . predniSONE (DELTASONE) 10 MG tablet Take 1 tablet (10 mg total) by mouth as directed.  100 tablet  1  . predniSONE (DELTASONE) 20 MG tablet TAKE 2 TABLETS (40 MG TOTAL) BY MOUTH DAILY WITH BREAKFAST.  20 tablet  0  . simvastatin (ZOCOR) 40 MG tablet Take 40 mg by mouth at bedtime.        No current facility-administered medications for this visit.    Physical Exam:  Gen: no apparent distress Lungs: CTA bilaterally Heart: RRR Incisions: completely healed, no evidence of acute infection.  Area palpated and no bump was appreciated and no areas of tenderness noted.  Diagnostic Tests:  CXR: interstitial lung disease, no evidence of acute lung process or pneumothorax  A/P:  [name] [name] is  S/P Lung Biopsy from Left Upper Lobe and Left Lower Lobe doing very well.  I suspect her pain is related to muscle soreness.  Her CXR does not show evidence of an acute lung process.  I encouraged the patient to return to the office should this develop again.  She was agreeable to this.

## 2013-12-04 ENCOUNTER — Encounter: Payer: Self-pay | Admitting: Pulmonary Disease

## 2013-12-04 ENCOUNTER — Ambulatory Visit (INDEPENDENT_AMBULATORY_CARE_PROVIDER_SITE_OTHER): Payer: Medicare Other | Admitting: Pulmonary Disease

## 2013-12-04 VITALS — BP 152/78 | HR 98 | Temp 98.0°F | Ht 60.0 in | Wt 124.6 lb

## 2013-12-04 DIAGNOSIS — J8410 Pulmonary fibrosis, unspecified: Secondary | ICD-10-CM

## 2013-12-04 DIAGNOSIS — J849 Interstitial pulmonary disease, unspecified: Secondary | ICD-10-CM

## 2013-12-04 NOTE — Patient Instructions (Signed)
Stay on prednisone at 15mg  a day for the next 4 weeks.  Then decrease to 12.5mg  each day for 4 weeks, then decrease to 10mg  each day until next visit. Please call if you see a change in your breathing. Work on conditioning program followup with me again in 3 mos.

## 2013-12-04 NOTE — Progress Notes (Signed)
   Subjective:    Patient ID: [name] [name], female    DOB: 1945-12-17, 68 y.o.   MRN: [id number]  HPI The patient comes in today for followup of her known interstitial lung disease. She has been diagnosed with NSIP by biopsy, but has had a negative autoimmune workup and also hypersensitivity panel. She has seen a significant improvement since being on prednisone, and is down to 15 mg a day without issues. She feels that her breathing is adequate, and no longer requires oxygen. She denies cough or congestion.   Review of Systems  Constitutional: Negative for fever and unexpected weight change.  HENT: Negative for congestion, dental problem, ear pain, nosebleeds, postnasal drip, rhinorrhea, sinus pressure, sneezing, sore throat and trouble swallowing.   Eyes: Negative for redness and itching.  Respiratory: Negative for cough, chest tightness, shortness of breath and wheezing.   Cardiovascular: Negative for palpitations and leg swelling.  Gastrointestinal: Negative for nausea and vomiting.  Genitourinary: Negative for dysuria.  Musculoskeletal: Negative for joint swelling.  Skin: Negative for rash.  Neurological: Negative for headaches.  Hematological: Does not bruise/bleed easily.  Psychiatric/Behavioral: Negative for dysphoric mood. The patient is not nervous/anxious.        Objective:   Physical Exam Well-developed female in no acute distress Nose without purulence or discharge noted Neck without lymphadenopathy or thyromegaly Chest with no significant crackles or wheezes heard Cardiac exam with regular rate and rhythm Lower extremities without edema, no cyanosis Alert and oriented, moves all 4 extremities.       Assessment & Plan:

## 2013-12-04 NOTE — Assessment & Plan Note (Signed)
The patient has done very well with a prednisone course, and is now down to 15 mg a day without pulmonary symptoms. We will slowly taper her prednisone, and at some point may consider trying her off medication. We need to continue to be vigilant, and possibly recheck her autoimmune panel within the next 6-12 months.  I have also encouraged her to work aggressively on a conditioning program.

## 2014-01-19 ENCOUNTER — Encounter: Payer: Self-pay | Admitting: Family

## 2014-01-20 ENCOUNTER — Ambulatory Visit (INDEPENDENT_AMBULATORY_CARE_PROVIDER_SITE_OTHER)
Admission: RE | Admit: 2014-01-20 | Discharge: 2014-01-20 | Disposition: A | Discharge status: Home / Self Care | Payer: Medicare Other | Source: Ambulatory Visit | Attending: Family | Admitting: Family

## 2014-01-20 ENCOUNTER — Encounter: Payer: Self-pay | Admitting: Family

## 2014-01-20 ENCOUNTER — Other Ambulatory Visit: Payer: Self-pay | Admitting: Family

## 2014-01-20 ENCOUNTER — Ambulatory Visit (INDEPENDENT_AMBULATORY_CARE_PROVIDER_SITE_OTHER): Payer: Medicare Other | Admitting: Family

## 2014-01-20 ENCOUNTER — Ambulatory Visit (HOSPITAL_COMMUNITY)
Admission: RE | Admit: 2014-01-20 | Discharge: 2014-01-20 | Disposition: A | Discharge status: Home / Self Care | Payer: Medicare Other | Source: Ambulatory Visit | Attending: Family | Admitting: Family

## 2014-01-20 VITALS — BP 152/72 | HR 85 | Resp 16 | Ht 61.0 in | Wt 122.0 lb

## 2014-01-20 DIAGNOSIS — Z48812 Encounter for surgical aftercare following surgery on the circulatory system: Secondary | ICD-10-CM

## 2014-01-20 DIAGNOSIS — I6529 Occlusion and stenosis of unspecified carotid artery: Secondary | ICD-10-CM

## 2014-01-20 DIAGNOSIS — I739 Peripheral vascular disease, unspecified: Secondary | ICD-10-CM

## 2014-01-20 NOTE — Progress Notes (Signed)
[facility] [place] HISTORY AND PHYSICAL   MRN : [id number]  History of Present Illness:   [name] [name] is a 68 y.o. female patient of Dr. Donnetta Hutching who is status post aortobifemoral bypass and left fem-pop. grafting in 2010. She then had right fem-pop. bypass graft in March, 2013 with subsequent revision on 06/17/12.  She returns today for scheduled LE ABI's and bilteral LE Duplex surveillance.  She states that she had a stress test and it was normal.  She has a history of hyperthyroidism with accompanying tachycardia, but she denies knowing of any atrial fib. She had a thyroid ablation in 2004 and has had no tachycardia since then. She denies any history of heart disease and has not had any cardiac interventions.  She states that her blood pressure is elevated in medical offices but is normally 115/60 in her left arm.  She had a lung biopsy on Oct. 8, 2014 for progressive scar tissue noted on imaging, followed by Dr. Gwenette Greet, pulmonologist, no malignancy found, she was mostly bed bound for 4 days due to being "hooked up".  She is taking prednisone for a short time post lung biopsy. Dr. Peter Martinique is her cardiolgist.  Pt. denies claudication in lower extremities and arms, denies rest pain, denies night pain, denies non healing ulcers on either extremity.  Patient has Negative history of TIA or stroke symptom. The patient denies amaurosis fugax or monocular blindness. The patient denies facial drooping.  Pt. denies hemiplegia. The patient denies receptive or expressive aphasia. Pt. denies weakness.  The patient has not had back or abdominal pain.    Pt Diabetic: No  Pt smoker: former smoker, quit 2002   Current Outpatient Prescriptions  Medication Sig Dispense Refill  . acetaminophen (TYLENOL) 500 MG tablet Take 500 mg by mouth daily as needed. For headache      . ALPRAZolam (XANAX) 0.25 MG tablet Take 0.25 mg by mouth at bedtime as needed for sleep or anxiety. For  anxiety/sleep      . aspirin EC 81 MG tablet Take 81 mg by mouth at bedtime.      Marland Kitchen CALCIUM PO Take 2,000 Units by mouth daily.      . Cholecalciferol 4000 UNITS CAPS Take 2,000 Units by mouth daily.       Marland Kitchen dextromethorphan-guaiFENesin (MUCINEX DM) 30-600 MG per 12 hr tablet Take 1 tablet by mouth 2 (two) times daily as needed for cough.      . levothyroxine (SYNTHROID, LEVOTHROID) 112 MCG tablet Take 112 mcg by mouth daily.       . predniSONE (DELTASONE) 10 MG tablet Take 10 mg by mouth as directed.       . simvastatin (ZOCOR) 40 MG tablet Take 40 mg by mouth at bedtime.        No current facility-administered medications for this visit.    Pt meds include: Statin :Yes ASA: Yes Other anticoagulants/antiplatelets: no  Past Medical History  Diagnosis Date  . Peripheral arterial disease   . Hyperlipidemia   . Thyroid disease     Hypothyroidism  . Hypothyroidism   . Blood transfusion     2 /12 yrs ago  . Arthritis   . Arrhythmia     when hyperthyroid  . Dyspnea on exertion 01/22/2013  . GERD (gastroesophageal reflux disease)     otc    Past Surgical History  Procedure Laterality Date  . Pr vein bypass graft,aorto-fem-pop  03/02/09    Aortobifemoral BPG, Bilateral  fem- popliteal BPG  . Hand surgery      cyst removed left hand  . Appendectomy      45 yrs ago  . Femoral-popliteal bypass graft  09/11/2011    Procedure: BYPASS GRAFT FEMORAL-POPLITEAL ARTERY;  Surgeon: Rosetta Posner, MD;  Location: [place];  Service: Vascular;  Laterality: Right;  Thrombectomy right femoral popliteal bypass with intraoperative arteriogram  . Embolectomy  09/12/2011    Procedure: EMBOLECTOMY;  Surgeon: Theotis Burrow, MD;  Location: [facility] OR;  Service: Vascular;  Laterality: Right;  Embolectomy Right Femoral -Popliteal Bypass Graft, Revision of Distal Anastomosis  . Femoral-popliteal bypass graft  09/11/2011    Procedure: BYPASS GRAFT FEMORAL-POPLITEAL ARTERY;  Surgeon: Rosetta Posner, MD;  Location: [place];  Service: Vascular;  Laterality: Right;  Thrombectomy right femoral popliteal bypass with intraoperative arteriogram  . Breast surgery      duct removed left breast  . Femoral-popliteal bypass graft  01/17/2012    Procedure: BYPASS GRAFT FEMORAL-POPLITEAL ARTERY;  Surgeon: Rosetta Posner, MD;  Location[facility] OR;  Service: Vascular;  Laterality: Right;  right femoral to below knee popliteal bypass graft with vein  . Intraoperative arteriogram  01/17/2012    Procedure: INTRA OPERATIVE ARTERIOGRAM;  Surgeon: Rosetta Posner, MD;  Location: [facility] OR;  Service: Vascular;  Laterality: Right;  . Abdominal hysterectomy    . Femoral-popliteal bypass graft  06/17/2012    Procedure: BYPASS GRAFT FEMORAL-POPLITEAL ARTERY;  Surgeon: Rosetta Posner, MD;  Location: [facility] OR;  Service: Vascular;  Laterality: Right;  revision  . Cardiovascular stress test  May 2014  . Pulmonary stress test  July 2014  . Video assisted thoracoscopy Left 08/11/2013    Procedure: VIDEO ASSISTED THORACOSCOPY;  Surgeon: Melrose Nakayama, MD;  Location: [place];  Service: Thoracic;  Laterality: Left;  LEFT  VATS, LUNG BX  . Lung biopsy  Oct. 2014    Social History History  Substance Use Topics  . Smoking status: Former Smoker -- 1.00 packs/day for 20 years    Types: Cigarettes    Quit date: 09/07/1999  . Smokeless tobacco: Never Used  . Alcohol Use: 4.2 oz/week    7 Glasses of wine per week     Comment: wine-- 5 glasses a week    Family History Family History  Problem Relation Age of Onset  . Anesthesia problems Neg Hx   . Hypotension Neg Hx   . Malignant hyperthermia Neg Hx   . Pseudochol deficiency Neg Hx   . COPD Mother   . Cancer Mother     BREAST  . Cancer Father     LUNG  . Cancer - Colon Brother   . Cancer Brother     Colon  . Heart attack Brother   . Lymphoma Brother   . Heart disease Brother     Heart disease before age 66  . Cancer Brother   . Cancer - Colon Sister   . Cancer Sister     Allergies   Allergen Reactions  . Morphine And Related Nausea And Vomiting     REVIEW OF SYSTEMS: See HPI for pertinent positives and negatives.  Physical Examination Filed Vitals:   01/20/14 1235  BP: 152/72  Pulse: 85  Resp: 16  Height: 5\' 1"  (1.549 m)  Weight: 122 lb (55.339 kg)  SpO2: 99%   Body mass index is 23.06 kg/(m^2).  General:  WDWN in NAD Gait: Normal HENT: WNL Eyes: Pupils equal Pulmonary: normal non-labored breathing ,  without Rales, rhonchi,  wheezing Cardiac: RRR, no murmurs detected  Abdomen: soft, NT, no masses Skin: no rashes, ulcers noted;  no Gangrene , no cellulitis; no open wounds;   VASCULAR EXAM  Carotid Bruits Left Right   Negative Negative                             VASCULAR EXAM: Extremities without ischemic changes  without Gangrene; without open wounds.                                                                                                          LE Pulses LEFT RIGHT       FEMORAL   palpable   palpable        POPLITEAL  not palpable   not palpable       POSTERIOR TIBIAL  not palpable    palpable        DORSALIS PEDIS      ANTERIOR TIBIAL faintly palpable   palpable      Musculoskeletal: no muscle wasting or atrophy; no edema  Neurologic: A&O X 3; Appropriate Affect ;  SENSATION: normal; MOTOR FUNCTION: 5/5 Symmetric, CN 2-12 intact Speech is fluent/normal   Significant Diagnostic Studies:   Non-Invasive Vascular Imaging )01/20/2014):   CEREBROVASCULAR DUPLEX EVALUATION    INDICATION: Carotid stenosis     PREVIOUS INTERVENTION(S): N/A    DUPLEX EXAM:     RIGHT  LEFT  Peak Systolic Velocities (cm/s) End Diastolic Velocities (cm/s) Plaque LOCATION Peak Systolic Velocities (cm/s) End Diastolic Velocities (cm/s) Plaque  97 9  CCA PROXIMAL 87 14   71 8  CCA MID 53 14   86 8 CP CCA DISTAL 45/105 12/22 CP  177 0 CP ECA 124 0 CP  202 30 CP ICA PROXIMAL 194 42 CP  162 24 HT ICA MID 132 24   66 14  ICA DISTAL 115  28     2.9 ICA / CCA Ratio (PSV) 3.7  Antegrade Vertebral Flow Antegrade  78 Brachial Systolic Pressure (mmHg) 188  Monophasic Brachial Artery Waveforms Triphasic    Plaque Morphology:  HM = Homogeneous, HT = Heterogeneous, CP = Calcific Plaque, SP = Smooth Plaque, IP = Irregular Plaque     ADDITIONAL FINDINGS:           IMPRESSION: Right internal carotid artery stenosis present in the less than 40% range, however may be significantly underestimated due to calcific plaque present making Doppler interrogation difficult. Left distal common carotid artery disease of at least 50% present. Left internal carotid artery stenosis present in the 40%-59% range, however underestimated due to calcific plaque present making Doppler interrogation difficult and distal common carotid artery disease present. Left external carotid artery unable to obtain color or spectral waveform at origin which may be suggestive of a short segment occlusion.   LOWER EXTREMITY ARTERIAL DUPLEX EVALUATION    INDICATION: Peripheral vascular disease     PREVIOUS INTERVENTION(S): Aorto bi-femoral bypass graft and bilateral femoral-popliteal bypass grafts placed  03/02/2009; Right fem-pop redo 01/17/2012; Revision proximal and distal anastomosis right fem-pop graft 06/17/2012    DUPLEX EXAM:     RIGHT  LEFT   Peak Systolic Velocity (cm/s) Ratio (if abnormal) Waveform  Peak Systolic Velocity (cm/s) Ratio (if abnormal) Waveform  195  B Inflow Artery 84  B  220  T Proximal Anastomosis 46  M  167  B Proximal Graft 0  Occluded  92  B Mid Graft 0  Occluded  117  B  Distal Graft 0  Occluded  90  B Distal Anastomosis 73  M  127/124  B/B Outflow Artery 30/37  M/M  1.0/0.87 Today's ABI / TBI 0.62/0.41  1.07/0.69 Previous ABI / TBI (07/21/2013 ) 1.15/0.74    Waveform:    M - Monophasic       B - Biphasic       T - Triphasic  If Ankle Brachial Index (ABI) or Toe Brachial Index (TBI) performed, please see complete report      ADDITIONAL FINDINGS:     IMPRESSION: Elevated velocities present involving the right proximal femoral-popliteal artery graft anastomosis suggestive of 50%-70% stenosis.  Remainder of the right femoral-popliteal artery bypass graft is within normal limits. No color or spectral Doppler waveforms could be obtained involving the proximal to distal segment of the left femoral-popliteal artery bypass graft or the left anterior tibial artery suggesting vessel occlusions.    Compared to the previous exam:  Stable right ankle brachial index and decrease of the left since previous study on 07/21/2013.     ASSESSMENT:  [name] [name] is a 68 y.o. female who is status post aortobifemoral bypass and left fem-pop. grafting in 2010. She then had right fem-pop. bypass graft in March, 2013 with subsequent revision on 06/17/12.  6 months ago both LE bypass grafts were patent. Today the left fem-pop graft is occluded, and the left anterior tibial artery is occluded, but she has no claudication symptoms.  Stable right ankle brachial index and decrease of the left since previous study on 07/21/2013. The right fem-pop. bypass graft remains patent. She has diffuse mild/moderate carotid artery disease, is asymptomatic.  Dr. Donnetta Hutching spoke with patient.   PLAN:   Based on today's exam and non-invasive vascular lab results, and after discussing with Dr. Donnetta Hutching, and Dr. Donnetta Hutching speaking with the patient,  the patient will follow up in 6 months with the following tests:  bilateral LE arterial Duplex, ABI's. I discussed in depth with the patient the nature of atherosclerosis, and emphasized the importance of maximal medical management including strict control of blood pressure, blood glucose, and lipid levels, obtaining regular exercise, and continued cessation of smoking.  The patient is aware that without maximal medical management the underlying atherosclerotic disease process will progress, limiting the benefit of any  interventions.  The patient was given information about stroke prevention and what symptoms should prompt the patient to seek immediate medical care.  The patient was given information about PAD including signs, symptoms, treatment, what symptoms should prompt the patient to seek immediate medical care, and risk reduction measures to take. Thank you for allowing [place] to participate in this patient's care.  Clemon Chambers, RN, MSN, FNP-C [facility] Office: [phone]  Clinic MD: Early 01/20/2014 12:47 PM

## 2014-01-20 NOTE — Patient Instructions (Signed)
Peripheral Vascular Disease Peripheral Vascular Disease (PVD), also called Peripheral Arterial Disease (PAD), is a circulation problem caused by cholesterol (atherosclerotic plaque) deposits in the arteries. PVD commonly occurs in the lower extremities (legs) but it can occur in other areas of the body, such as your arms. The cholesterol buildup in the arteries reduces blood flow which can cause pain and other serious problems. The presence of PVD can place a person at risk for Coronary Artery Disease (CAD).  CAUSES  Causes of PVD can be many. It is usually associated with more than one risk factor such as:   High Cholesterol.  Smoking.  Diabetes.  Lack of exercise or inactivity.  High blood pressure (hypertension).  Obesity.  Family history. SYMPTOMS   When the lower extremities are affected, patients with PVD may experience:  Leg pain with exertion or physical activity. This is called INTERMITTENT CLAUDICATION. This may present as cramping or numbness with physical activity. The location of the pain is associated with the level of blockage. For example, blockage at the abdominal level (distal abdominal aorta) may result in buttock or hip pain. Lower leg arterial blockage may result in calf pain.  As PVD becomes more severe, pain can develop with less physical activity.  In people with severe PVD, leg pain may occur at rest.  Other PVD signs and symptoms:  Leg numbness or weakness.  Coldness in the affected leg or foot, especially when compared to the other leg.  A change in leg color.  Patients with significant PVD are more prone to ulcers or sores on toes, feet or legs. These may take longer to heal or may reoccur. The ulcers or sores can become infected.  If signs and symptoms of PVD are ignored, gangrene may occur. This can result in the loss of toes or loss of an entire limb.  Not all leg pain is related to PVD. Other medical conditions can cause leg pain such  as:  Blood clots (embolism) or Deep Vein Thrombosis.  Inflammation of the blood vessels (vasculitis).  Spinal stenosis. DIAGNOSIS  Diagnosis of PVD can involve several different types of tests. These can include:  Pulse Volume Recording Method (PVR). This test is simple, painless and does not involve the use of X-rays. PVR involves measuring and comparing the blood pressure in the arms and legs. An ABI (Ankle-Brachial Index) is calculated. The normal ratio of blood pressures is 1. As this number becomes smaller, it indicates more severe disease.  < 0.95  indicates significant narrowing in one or more leg vessels.  <0.8 there will usually be pain in the foot, leg or buttock with exercise.  <0.4 will usually have pain in the legs at rest.  <0.25  usually indicates limb threatening PVD.  Doppler detection of pulses in the legs. This test is painless and checks to see if you have a pulses in your legs/feet.  A dye or contrast material (a substance that highlights the blood vessels so they show up on x-ray) may be given to help your caregiver better see the arteries for the following tests. The dye is eliminated from your body by the kidney's. Your caregiver may order blood work to check your kidney function and other laboratory values before the following tests are performed:  Magnetic Resonance Angiography (MRA). An MRA is a picture study of the blood vessels and arteries. The MRA machine uses a large magnet to produce images of the blood vessels.  Computed Tomography Angiography (CTA). A CTA is a   specialized x-ray that looks at how the blood flows in your blood vessels. An IV may be inserted into your arm so contrast dye can be injected.  Angiogram. Is a procedure that uses x-rays to look at your blood vessels. This procedure is minimally invasive, meaning a small incision (cut) is made in your groin. A small tube (catheter) is then inserted into the artery of your groin. The catheter is  guided to the blood vessel or artery your caregiver wants to examine. Contrast dye is injected into the catheter. X-rays are then taken of the blood vessel or artery. After the images are obtained, the catheter is taken out. TREATMENT  Treatment of PVD involves many interventions which may include:  Lifestyle changes:  Quitting smoking.  Exercise.  Following a low fat, low cholesterol diet.  Control of diabetes.  Foot care is very important to the PVD patient. Good foot care can help prevent infection.  Medication:  Cholesterol-lowering medicine.  Blood pressure medicine.  Anti-platelet drugs.  Certain medicines may reduce symptoms of Intermittent Claudication.  Interventional/Surgical options:  Angioplasty. An Angioplasty is a procedure that inflates a balloon in the blocked artery. This opens the blocked artery to improve blood flow.  Stent Implant. A wire mesh tube (stent) is placed in the artery. The stent expands and stays in place, allowing the artery to remain open.  Peripheral Bypass Surgery. This is a surgical procedure that reroutes the blood around a blocked artery to help improve blood flow. This type of procedure may be performed if Angioplasty or stent implants are not an option. SEEK IMMEDIATE MEDICAL CARE IF:   You develop pain or numbness in your arms or legs.  Your arm or leg turns cold, becomes blue in color.  You develop redness, warmth, swelling and pain in your arms or legs. MAKE SURE YOU:   Understand these instructions.  Will watch your condition.  Will get help right away if you are not doing well or get worse. Document Released: 11/23/2004 Document Revised: 01/08/2012 Document Reviewed: 10/20/2008 Exit[employer] Patient Information 2014 [employer].   Stroke Prevention Some medical conditions and behaviors are associated with an increased chance of having a stroke. You may prevent a stroke by making healthy choices and managing medical  conditions. HOW CAN I REDUCE MY RISK OF HAVING A STROKE?   Stay physically active. Get at least 30 minutes of activity on most or all days.  Do not smoke. It may also be helpful to avoid exposure to secondhand smoke.  Limit alcohol use. Moderate alcohol use is considered to be:  No more than 2 drinks per day for men.  No more than 1 drink per day for nonpregnant women.  Eat healthy foods. This involves  Eating 5 or more servings of fruits and vegetables a day.  Following a diet that addresses high blood pressure (hypertension), high cholesterol, diabetes, or obesity.  Manage your cholesterol levels.  A diet low in saturated fat, trans fat, and cholesterol and high in fiber may control cholesterol levels.  Take any prescribed medicines to control cholesterol as directed by your health care provider.  Manage your diabetes.  A controlled-carbohydrate, controlled-sugar diet is recommended to manage diabetes.  Take any prescribed medicines to control diabetes as directed by your health care provider.  Control your hypertension.  A low-salt (sodium), low-saturated fat, low-trans fat, and low-cholesterol diet is recommended to manage hypertension.  Take any prescribed medicines to control hypertension as directed by your health care provider.    Maintain a healthy weight.  A reduced-calorie, low-sodium, low-saturated fat, low-trans fat, low-cholesterol diet is recommended to manage weight.  Stop drug abuse.  Avoid taking birth control pills.  Talk to your health care provider about the risks of taking birth control pills if you are over 35 years old, smoke, get migraines, or have ever had a blood clot.  Get evaluated for sleep disorders (sleep apnea).  Talk to your health care provider about getting a sleep evaluation if you snore a lot or have excessive sleepiness.  Take medicines as directed by your health care provider.  For some people, aspirin or blood thinners  (anticoagulants) are helpful in reducing the risk of forming abnormal blood clots that can lead to stroke. If you have the irregular heart rhythm of atrial fibrillation, you should be on a blood thinner unless there is a good reason you cannot take them.  Understand all your medicine instructions.  Make sure that other other conditions (such as anemia or atherosclerosis) are addressed. SEEK IMMEDIATE MEDICAL CARE IF:   You have sudden weakness or numbness of the face, arm, or leg, especially on one side of the body.  Your face or eyelid droops to one side.  You have sudden confusion.  You have trouble speaking (aphasia) or understanding.  You have sudden trouble seeing in one or both eyes.  You have sudden trouble walking.  You have dizziness.  You have a loss of balance or coordination.  You have a sudden, severe headache with no known cause.  You have new chest pain or an irregular heartbeat. Any of these symptoms may represent a serious problem that is an emergency. Do not wait to see if the symptoms will go away. Get medical help at once. Call your local emergency services  (911 in [place] Do not drive yourself to the hospital. Document Released: 11/23/2004 Document Revised: 08/06/2013 Document Reviewed: 04/18/2013 Exit[employer] Patient Information 2014 [employer].  

## 2014-03-03 ENCOUNTER — Encounter: Payer: Self-pay | Admitting: Pulmonary Disease

## 2014-03-03 ENCOUNTER — Ambulatory Visit (INDEPENDENT_AMBULATORY_CARE_PROVIDER_SITE_OTHER): Payer: Medicare Other | Admitting: Pulmonary Disease

## 2014-03-03 VITALS — BP 140/90 | HR 104 | Temp 97.2°F | Ht 60.0 in | Wt 126.8 lb

## 2014-03-03 DIAGNOSIS — J849 Interstitial pulmonary disease, unspecified: Secondary | ICD-10-CM

## 2014-03-03 DIAGNOSIS — J8410 Pulmonary fibrosis, unspecified: Secondary | ICD-10-CM

## 2014-03-03 MED ORDER — PREDNISONE 10 MG PO TABS: 10.0000 mg | ORAL_TABLET | ORAL | Status: DC

## 2014-03-03 NOTE — Progress Notes (Signed)
   Subjective:    Patient ID: [name] [name], female    DOB: [birth year], 68 y.o.   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  She has been treated with steroids, and has had a significant improvement in her breathing. She is now down to 10 mg a day and appears to be maintaining her baseline. She still has dyspnea on exertion, but is able to work around it. She has no significant cough. A new symptom for her has been swelling and discomfort in her MCP's.     Review of Systems  Constitutional: Negative for fever and unexpected weight change.  HENT: Negative for congestion, dental problem, ear pain, nosebleeds, postnasal drip, rhinorrhea, sinus pressure, sneezing, sore throat and trouble swallowing.   Eyes: Negative for redness and itching.  Respiratory: Negative for cough, chest tightness, shortness of breath and wheezing.   Cardiovascular: Negative for palpitations and leg swelling.  Gastrointestinal: Negative for nausea and vomiting.  Genitourinary: Negative for dysuria.  Musculoskeletal: Negative for joint swelling.  Skin: Negative for rash.  Neurological: Negative for headaches.  Hematological: Does not bruise/bleed easily.  Psychiatric/Behavioral: Negative for dysphoric mood. The patient is not nervous/anxious.        Objective:   Physical Exam Well-developed female in no acute distress Nose without purulence or discharge noted Neck without lymphadenopathy or thyromegaly Chest with basilar crackles one third the way up, no wheezing Cardiac exam with regular rate and rhythm Lower extremities without edema, no cyanosis Alert and oriented, moves all 4 extremities.       Assessment & Plan:

## 2014-03-03 NOTE — Assessment & Plan Note (Signed)
The patient has done very well from an interstitial lung disease standpoint on prednisone, and she is now down to a low dose and is maintaining baseline. She is satisfied with her exertional tolerance, but has a new complaint of swelling in her knuckles with discomfort. This concerns me for the possibility of an inflammatory arthritis, and will therefore send her to rheumatology for evaluation. I would like her to stay on her current dose of prednisone for at least the next 3 months, and she may need additional therapy depending upon rheumatology findings.

## 2014-03-03 NOTE — Addendum Note (Signed)
Addended by: Lilli Few on: 03/03/2014 10:51 AM   Modules accepted: Orders

## 2014-03-03 NOTE — Patient Instructions (Signed)
Continue on prednisone 10mg  each day for now. Will refer you to rheumatology for evaluation of your joints.  Let me know after your visit so I can get their note for review.  followup with me in 64mos.

## 2014-06-03 ENCOUNTER — Ambulatory Visit (INDEPENDENT_AMBULATORY_CARE_PROVIDER_SITE_OTHER): Payer: Medicare Other | Admitting: Pulmonary Disease

## 2014-06-03 ENCOUNTER — Encounter: Payer: Self-pay | Admitting: Pulmonary Disease

## 2014-06-03 ENCOUNTER — Ambulatory Visit (INDEPENDENT_AMBULATORY_CARE_PROVIDER_SITE_OTHER)
Admission: RE | Admit: 2014-06-03 | Discharge: 2014-06-03 | Disposition: A | Discharge status: Home / Self Care | Payer: Medicare Other | Source: Ambulatory Visit | Attending: Pulmonary Disease | Admitting: Pulmonary Disease

## 2014-06-03 VITALS — BP 140/68 | HR 103 | Temp 97.8°F | Ht 61.0 in | Wt 122.2 lb

## 2014-06-03 DIAGNOSIS — J8410 Pulmonary fibrosis, unspecified: Secondary | ICD-10-CM

## 2014-06-03 DIAGNOSIS — J849 Interstitial pulmonary disease, unspecified: Secondary | ICD-10-CM

## 2014-06-03 LAB — PULMONARY FUNCTION TEST
DL/VA % pred: 59 %
DL/VA: 2.57 ml/min/mmHg/L
DLCO UNC: 6.97 ml/min/mmHg
DLCO unc % pred: 35 %
FEF 25-75 POST: 3.87 L/s
FEF 25-75 Pre: 3.47 L/sec
FEF2575-%Change-Post: 11 %
FEF2575-%Pred-Post: 214 %
FEF2575-%Pred-Pre: 192 %
FEV1-%Change-Post: 3 %
FEV1-%Pred-Post: 80 %
FEV1-%Pred-Pre: 77 %
FEV1-Post: 1.62 L
FEV1-Pre: 1.56 L
FEV1FVC-%Change-Post: 0 %
FEV1FVC-%Pred-Pre: 122 %
FEV6-%Change-Post: 1 %
FEV6-%Pred-Post: 67 %
FEV6-%Pred-Pre: 65 %
FEV6-Post: 1.7 L
FEV6-Pre: 1.67 L
FEV6FVC-%Change-Post: 0 %
FEV6FVC-%PRED-POST: 105 %
FEV6FVC-%Pred-Pre: 104 %
FVC-%CHANGE-POST: 2 %
FVC-%PRED-POST: 64 %
FVC-%Pred-Pre: 63 %
FVC-POST: 1.72 L
FVC-PRE: 1.68 L
POST FEV6/FVC RATIO: 100 %
PRE FEV1/FVC RATIO: 93 %
PRE FEV6/FVC RATIO: 100 %
Post FEV1/FVC ratio: 94 %
RV % PRED: 45 %
RV: 0.9 L
TLC % PRED: 60 %
TLC: 2.73 L

## 2014-06-03 NOTE — Progress Notes (Signed)
   Subjective:    Patient ID: [name] [name], female    DOB: 07-18-1946, 68 y.o.   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 She has had a definite response to prednisone, and has been tapered to 10 mg a day. Most recently, she has had hand discomfort, and was referred to rheumatology for possible rheumatoid arthritis. It was unclear whether she did or did not have RA, and rheumatology felt that we should try and wean her off prednisone to see if she had a flare up of her joint disease.  She comes in today where she feels that her breathing is stable, and she continues to be active. However, she has had some increased cough over the last 2 months that she blames on allergy symptoms. It should be noted the patient is due for her followup chest x-ray and PFTs.   Review of Systems  Constitutional: Negative for fever and unexpected weight change.  HENT: Negative for congestion, dental problem, ear pain, nosebleeds, postnasal drip, rhinorrhea, sinus pressure, sneezing, sore throat and trouble swallowing.   Eyes: Negative for redness and itching.  Respiratory: Positive for cough. Negative for chest tightness, shortness of breath and wheezing.   Cardiovascular: Negative for palpitations and leg swelling.  Gastrointestinal: Negative for nausea and vomiting.  Genitourinary: Negative for dysuria.  Musculoskeletal: Negative for joint swelling.  Skin: Negative for rash.  Neurological: Negative for headaches.  Hematological: Does not bruise/bleed easily.  Psychiatric/Behavioral: Negative for dysphoric mood. The patient is not nervous/anxious.        Objective:   Physical Exam Well-developed female in no acute distress Nose without purulence or discharge noted Neck without lymphadenopathy or thyromegaly Chest with minimal basilar crackles, no wheezing Cardiac exam with regular  rate and rhythm Lower extremities without edema, no cyanosis Alert and oriented, moves all 4 extremities.       Assessment & Plan:

## 2014-06-03 NOTE — Progress Notes (Signed)
PFT done today. 

## 2014-06-03 NOTE — Assessment & Plan Note (Signed)
The patient has biopsy-proven NSIP, and has done very well on prednisone in a low dose. I suspect she does have some type of autoimmune process, and recently she has had joint complaints that are difficult to characterize. She has seen rheumatology who cannot find any physical evidence for rheumatoid arthritis, but remains suspicious. The patient currently is having increased cough over the last 2 months that may or may not be allergy related. I am concerned that it may be related to her interstitial lung disease, and she is due for her followup chest x-ray and PFTs. If there is definite worsening of her disease radiographically or by her breathing studies, I would support more aggressive therapy with the addition of Imuran or CellCept. If there is no change in her x-ray and PFTs, and her cough resolves with treatment of allergies, we can consider tapering her prednisone to better evaluate her joint complaints. The patient is agreeable to this approach.

## 2014-06-03 NOTE — Patient Instructions (Addendum)
Will schedule for breathing tests, and do a chest xray today to re-evaluate your lung disease. Stay on prednisone at 10mg  each day for now. Get chlorpheniramine 4mg  otc, and take 2 at bedtime each night.  Would also take one at lunch for next few weeks to see if your cough and allergy symptoms get better. Will call you with results of your testing, and we can discuss your treatment going forward.

## 2014-06-04 ENCOUNTER — Telehealth: Payer: Self-pay | Admitting: Pulmonary Disease

## 2014-06-04 NOTE — Telephone Encounter (Signed)
[email or web address].Beatrix Breece, CMA  

## 2014-06-04 NOTE — Telephone Encounter (Signed)
Let pt know that her cxr is completely stable, and her breathing studies do show some decline from last year.  It is a small amount but a decline nevertheless.  I would like to see what her cough does over the next 2-3 weeks.  Have her call me in 2-3 weeks to give update, and then we can decide where to go from there.

## 2014-06-05 NOTE — Telephone Encounter (Signed)
Pt advised. Cardale Dorer, CMA  

## 2014-06-05 NOTE — Telephone Encounter (Signed)
Pt returned call & asks to be reached at [phone].  Satira Anis

## 2014-06-26 ENCOUNTER — Telehealth: Payer: Self-pay | Admitting: Pulmonary Disease

## 2014-06-26 NOTE — Telephone Encounter (Signed)
Pt returned triage's call & asks to be reached at  [phone].  [name] [name]

## 2014-06-26 NOTE — Telephone Encounter (Signed)
Per 8.5.15 ov w/ KC: Patient Instructions     Will schedule for breathing tests, and do a chest xray today to re-evaluate your lung disease.  Stay on prednisone at 10mg  each day for now.  Get chlorpheniramine 4mg  otc, and take 2 at bedtime each night. Would also take one at lunch for next few weeks to see if your cough and allergy symptoms get better.  Will call you with results of your testing, and we can discuss your treatment going forward.   Per the 8.6.15 phone note: Kathee Delton, MD at 06/04/2014 9:20 AM     Let pt know that her cxr is completely stable, and her breathing studies do show some decline from last year. It is a small amount but a decline nevertheless. I would like to see what her cough does over the next 2-3 weeks. Have her call me in 2-3 weeks to give update, and then we can decide where to go from there.    LMOM TCB x1.

## 2014-06-26 NOTE — Telephone Encounter (Signed)
Called spoke with patient who reported that "everything is doing much better and her cough is not near as bad."  She does still have some cough, primarily in the mornings with some PND.  She denies being able to produce any purulent sputum.  Pt also denies any wheezing, tightness, dyspnea, sinus pressure/congestion, or ear congestion.  Pt is aware [place] is not in the office this week but will return on 8.31.15 - pt is okay with call back next week.  Dr Gwenette Greet please advise how you would like to proceed.  Thanks.

## 2014-06-27 ENCOUNTER — Other Ambulatory Visit: Payer: Self-pay | Admitting: Pulmonary Disease

## 2014-06-29 NOTE — Telephone Encounter (Signed)
It sounds like her cough has significantly improved?  If it has, why don't we decrease her prednisone to 5mg  a day, and have her see me again in 93mos.

## 2014-06-30 NOTE — Telephone Encounter (Signed)
[email or web address].Jennifer Castillo, CMA  

## 2014-07-01 NOTE — Telephone Encounter (Signed)
Called spoke with pt. She reports cough has improved and will decrease prednisone.  Appt scheduled in November. Nothing further needed

## 2014-07-01 NOTE — Telephone Encounter (Signed)
LMTCB

## 2014-07-01 NOTE — Telephone Encounter (Signed)
Patient returning call. [phone]

## 2014-07-27 ENCOUNTER — Ambulatory Visit (INDEPENDENT_AMBULATORY_CARE_PROVIDER_SITE_OTHER): Payer: Medicare Other | Admitting: Adult Health

## 2014-07-27 ENCOUNTER — Telehealth: Payer: Self-pay | Admitting: Pulmonary Disease

## 2014-07-27 ENCOUNTER — Encounter: Payer: Self-pay | Admitting: Adult Health

## 2014-07-27 ENCOUNTER — Encounter: Payer: Self-pay | Admitting: Family

## 2014-07-27 ENCOUNTER — Ambulatory Visit (INDEPENDENT_AMBULATORY_CARE_PROVIDER_SITE_OTHER)
Admission: RE | Admit: 2014-07-27 | Discharge: 2014-07-27 | Disposition: A | Discharge status: Home / Self Care | Payer: Medicare Other | Source: Ambulatory Visit | Attending: Adult Health | Admitting: Adult Health

## 2014-07-27 VITALS — BP 144/72 | HR 101 | Temp 97.8°F | Ht 61.0 in | Wt 122.2 lb

## 2014-07-27 DIAGNOSIS — J8410 Pulmonary fibrosis, unspecified: Secondary | ICD-10-CM

## 2014-07-27 DIAGNOSIS — J849 Interstitial pulmonary disease, unspecified: Secondary | ICD-10-CM

## 2014-07-27 MED ORDER — PREDNISONE 10 MG PO TABS: ORAL_TABLET | ORAL | Status: DC

## 2014-07-27 NOTE — Telephone Encounter (Signed)
Called and spoke with pt and she is aware of appt today with TP at 2:15.

## 2014-07-27 NOTE — Patient Instructions (Signed)
Increase Prednisone 20mg  daily for 2 weeks then 15mg  daily for 1 week then back to 10mg  daily -hold at his dose.  Begin Oxygen 2l/m continuous flow 24/7 .  Follow up Dr. Gwenette Greet in 1 week and As needed   Please contact office for sooner follow up if symptoms do not improve or worsen or seek emergency care

## 2014-07-27 NOTE — Addendum Note (Signed)
Addended by: Parke Poisson E on: 07/27/2014 03:18 PM   Modules accepted: Orders

## 2014-07-27 NOTE — Progress Notes (Signed)
   Subjective:    Patient ID: [name] [name], female    DOB: 02/21/1946, 68 y.o.   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 On chronic prednisone  -decreased prednisone 5mg  6 weeks ago, she increased 10mg  few days ago.  Complains of dry cough, chest congestion, increased SOB, some head congestion x4 days. Has  Denies f/c/s, n/v/d, hemoptysis, wheezing, tightness, PND, leg swelling.  No discolored mucus.  Sats were 85% RA upon entering exam room, placed on O2 2l/m with sats 94%.  Was on O2 last year, d/c in 09/2013 .    Review of Systems Constitutional:   No  weight loss, night sweats,  Fevers, chills,  +fatigue, or  lassitude.  HEENT:   No headaches,  Difficulty swallowing,  Tooth/dental problems, or  Sore throat,                No sneezing, itching, ear ache, nasal congestion, post nasal drip,   CV:  No chest pain,  Orthopnea, PND, swelling in lower extremities, anasarca, dizziness, palpitations, syncope.   GI  No heartburn, indigestion, abdominal pain, nausea, vomiting, diarrhea, change in bowel habits, loss of appetite, bloody stools.   Resp:    No chest wall deformity  Skin: no rash or lesions.  GU: no dysuria, change in color of urine, no urgency or frequency.  No flank pain, no hematuria   MS:  No joint pain or swelling.  No decreased range of motion.  No back pain.  Psych:  No change in mood or affect. No depression or anxiety.  No memory loss.         Objective:   Physical Exam GEN: A/Ox3; pleasant , NAD,  Elderly petite   HEENT:  [place]/AT,  EACs-clear, TMs-wnl, NOSE-clear, THROAT-clear, no lesions, no postnasal drip or exudate noted.   NECK:  Supple w/ fair ROM; no JVD; normal  carotid impulses w/o bruits; no thyromegaly or nodules palpated; no lymphadenopathy.  RESP  Bibasilar crackles no accessory muscle use, no dullness to percussion  CARD:  RRR, no m/r/g  , no peripheral edema, pulses intact, no cyanosis or clubbing., no calf pain , neg homans   GI:   Soft & nt; nml bowel sounds; no organomegaly or masses detected.  Musco: Warm bil, no deformities or joint swelling noted.   Neuro: alert, no focal deficits noted.    Skin: Warm, no lesions or rashes         Assessment & Plan:

## 2014-07-27 NOTE — Assessment & Plan Note (Addendum)
Suspect flare on lower dose of steroids  Previously on O2 last year  Will restart O2 -same day start  Hold on hospital admit at this time as does not appear to be toxic , advised that is worsens or not improving will need admit to hospital   Plan  Increase Prednisone 20mg  daily for 2 weeks then 15mg  daily for 1 week then back to 10mg  daily -hold at his dose.  Begin Oxygen 2l/m continuous flow 24/7 .  Follow up Dr. Gwenette Greet in 1 week and As needed   Please contact office for sooner follow up if symptoms do not improve or worsen or seek emergency care

## 2014-07-27 NOTE — Telephone Encounter (Signed)
Nothing to offer over the phone Needs ov this week with Tammy NP, Clance or me

## 2014-07-27 NOTE — Telephone Encounter (Signed)
Called and spoke with pt and she stated that her cough is back---cough is dry most of the time, it is non productive--pt stated that sometimes she can feel the congestion in her lungs but is not able to get this up.  She stated that as long as she is lying down she is ok, but when she gets up moving around she starts with the coughing and cannot stop.  She has no energy.  She stated that the coughing started last week and she has been on a lower dose of the prednisone x 1 month.  [place] is not in the office today.  MW please advise. Thanks  Allergies  Allergen Reactions  . Morphine And Related Nausea And Vomiting    Current Outpatient Prescriptions on File Prior to Visit  Medication Sig Dispense Refill  . acetaminophen (TYLENOL) 500 MG tablet Take 500 mg by mouth daily as needed. For headache      . ALPRAZolam (XANAX) 0.25 MG tablet Take 0.25 mg by mouth at bedtime as needed for sleep or anxiety. For anxiety/sleep      . aspirin EC 81 MG tablet Take 81 mg by mouth at bedtime.      Marland Kitchen CALCIUM PO Take 2,000 Units by mouth daily.      Marland Kitchen dextromethorphan-guaiFENesin (MUCINEX DM) 30-600 MG per 12 hr tablet Take 1 tablet by mouth 2 (two) times daily as needed for cough.      . levothyroxine (SYNTHROID, LEVOTHROID) 112 MCG tablet Take 112 mcg by mouth daily.       . predniSONE (DELTASONE) 10 MG tablet TAKE 1 TABLET (10 MG TOTAL) BY MOUTH AS DIRECTED.  30 tablet  3  . simvastatin (ZOCOR) 40 MG tablet Take 40 mg by mouth at bedtime.        No current facility-administered medications on file prior to visit.

## 2014-07-27 NOTE — Addendum Note (Signed)
Addended by: Parke Poisson E on: 07/27/2014 03:24 PM   Modules accepted: Orders

## 2014-07-28 ENCOUNTER — Encounter (HOSPITAL_COMMUNITY): Payer: Medicare Other

## 2014-07-28 ENCOUNTER — Ambulatory Visit: Payer: Medicare Other | Admitting: Family

## 2014-07-28 ENCOUNTER — Other Ambulatory Visit (HOSPITAL_COMMUNITY): Payer: Medicare Other

## 2014-07-28 ENCOUNTER — Telehealth: Payer: Self-pay | Admitting: Adult Health

## 2014-07-28 DIAGNOSIS — R0600 Dyspnea, unspecified: Secondary | ICD-10-CM

## 2014-07-28 NOTE — Telephone Encounter (Signed)
Order was placed during the ov yesterday 9/28 @ 3.15pm and verified receipt of order with Melissa w/ [facility] earlier this morning Unfortunately, pt was not provided with O2 until this afternoon - verified with patient When speaking with patient, apologized for any misunderstanding/delays in her care.  Pt is feeling "much better" today.  Will call pt on Thursday 10/1 to check on her and see if her portable concentrator was delivered.  Pt denied needing anything at this time; will sign off.

## 2014-07-28 NOTE — Telephone Encounter (Signed)
Order placed for new start O2  Spoke with [name] [name] and made aware that [name] is waiting on this order to be faxed right now. Pt was supposed to be given O2 last night but order was never placed after visit.  Will send to [facility] to make sure this gets completed soon so that the patient can get her oxygen today.

## 2014-07-30 NOTE — Progress Notes (Signed)
Quick Note:  LMOM TCB x1. Also need to check on pt and see if [facility] delivered portable concentrator. ______

## 2014-07-31 NOTE — Progress Notes (Signed)
Ov reviewed, and agree with plan as outlined.  

## 2014-08-03 ENCOUNTER — Encounter: Payer: Self-pay | Admitting: Pulmonary Disease

## 2014-08-03 ENCOUNTER — Ambulatory Visit (INDEPENDENT_AMBULATORY_CARE_PROVIDER_SITE_OTHER): Payer: Medicare Other | Admitting: Adult Health

## 2014-08-03 ENCOUNTER — Encounter: Payer: Self-pay | Admitting: Adult Health

## 2014-08-03 VITALS — BP 126/64 | HR 84 | Temp 97.6°F | Ht 61.0 in | Wt 125.8 lb

## 2014-08-03 DIAGNOSIS — R0609 Other forms of dyspnea: Secondary | ICD-10-CM

## 2014-08-03 DIAGNOSIS — J849 Interstitial pulmonary disease, unspecified: Secondary | ICD-10-CM

## 2014-08-03 DIAGNOSIS — Z23 Encounter for immunization: Secondary | ICD-10-CM

## 2014-08-03 NOTE — Patient Instructions (Signed)
Taper  Prednisone 20mg  daily for 2 weeks then 15mg  daily for 1 week then back to 10mg  daily -hold at his dose.  Continue on Oxygen 2l/m continuous with walking short of distance and 4l/m continuous with longer distances.  Want to keep Oxygen level >90%.  May take off Oxygen at rest .  Flu shot today .  Follow up Dr. Gwenette Greet in 4 weeks and As needed   Please contact office for sooner follow up if symptoms do not improve or worsen or seek emergency care

## 2014-08-03 NOTE — Progress Notes (Signed)
   Subjective:    Patient ID: [name] [name], female    DOB: 03-07-1946, 68 y.o.   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 On chronic prednisone  -decreased prednisone 5mg  6 weeks ago, she increased 10mg  few days ago.  Complains of dry cough, chest congestion, increased SOB, some head congestion x4 days. Has  Denies f/c/s, n/v/d, hemoptysis, wheezing, tightness, PND, leg swelling.  No discolored mucus.  Sats were 85% RA upon entering exam room, placed on O2 2l/m with sats 94%.  Was on O2 last year, d/c in 09/2013 .  >increased prednisone 20mg  , rx O2   08/03/2014 Follow up  Pt returns for 1 week flare of  ILD , seen last week with increased cough, dyspnea and exertional desats.  She was started on O2 and prednisone was increased 20mg  daily .  She returns today improved with resolution of cough.  And less dyspnea .  O2 sats at rest 91% on RA and continues to drop with walking but able to keep sats >90% on 2-4 L continuous flow depending on how far she walks.  Says she is feeling better.  Denies any congestion, wheezing, tightness, f/c/s, n/v/d, hemoptysis.   Review of Systems  Constitutional:   No  weight loss, night sweats,  Fevers, chills,  +fatigue, or  lassitude.  HEENT:   No headaches,  Difficulty swallowing,  Tooth/dental problems, or  Sore throat,                No sneezing, itching, ear ache, nasal congestion, post nasal drip,   CV:  No chest pain,  Orthopnea, PND, swelling in lower extremities, anasarca, dizziness, palpitations, syncope.   GI  No heartburn, indigestion, abdominal pain, nausea, vomiting, diarrhea, change in bowel habits, loss of appetite, bloody stools.   Resp:    No chest  wall deformity  Skin: no rash or lesions.  GU: no dysuria, change in color of urine, no urgency or frequency.  No flank pain, no hematuria   MS:  No joint pain or swelling.  No decreased range of motion.  No back pain.  Psych:  No change in mood or affect. No depression or anxiety.  No memory loss.         Objective:   Physical Exam  GEN: A/Ox3; pleasant , NAD,  Elderly petite   HEENT:  [place]/AT,  EACs-clear, TMs-wnl, NOSE-clear, THROAT-clear, no lesions, no postnasal drip or exudate noted.   NECK:  Supple w/ fair ROM; no JVD; normal carotid impulses w/o bruits; no thyromegaly or nodules palpated; no lymphadenopathy.  RESP  Bibasilar crackles no accessory muscle use, no dullness to percussion  CARD:  RRR, no m/r/g  , no peripheral edema, pulses intact, no cyanosis or clubbing., no calf pain , neg homans   GI:   Soft & nt; nml bowel sounds; no organomegaly or masses detected.  Musco: Warm bil, no deformities or joint swelling noted.   Neuro: alert, no focal deficits noted.    Skin: Warm, no lesions or rashes         Assessment & Plan:

## 2014-08-03 NOTE — Assessment & Plan Note (Signed)
Recent flare ? Secondary to drop in  prednisone dose.  cxr with  No acute process noted.  Improving with steroid burst and O2   Plan  Taper  Prednisone 20mg  daily for 2 weeks then 15mg  daily for 1 week then back to 10mg  daily -hold at his dose.  Continue on Oxygen 2l/m continuous with walking short of distance and 4l/m continuous with longer distances.  Want to keep Oxygen level >90%.  May take off Oxygen at rest .  Flu shot today .  Follow up Dr. Gwenette Greet in 4 weeks and As needed   Please contact office for sooner follow up if symptoms do not improve or worsen or seek emergency care

## 2014-08-03 NOTE — Progress Notes (Signed)
Ov reviewed and agree with plan as outlined.  

## 2014-08-06 NOTE — Progress Notes (Signed)
Quick Note:  Discussed at 08/03/14 ov w/ TP ______

## 2014-08-12 ENCOUNTER — Encounter: Payer: Self-pay | Admitting: Family

## 2014-08-13 ENCOUNTER — Ambulatory Visit (HOSPITAL_COMMUNITY)
Admission: RE | Admit: 2014-08-13 | Discharge: 2014-08-13 | Disposition: A | Discharge status: Home / Self Care | Payer: Medicare Other | Source: Ambulatory Visit | Attending: Family | Admitting: Family

## 2014-08-13 ENCOUNTER — Ambulatory Visit (INDEPENDENT_AMBULATORY_CARE_PROVIDER_SITE_OTHER): Payer: 59 | Admitting: Family

## 2014-08-13 ENCOUNTER — Ambulatory Visit (INDEPENDENT_AMBULATORY_CARE_PROVIDER_SITE_OTHER)
Admission: RE | Admit: 2014-08-13 | Discharge: 2014-08-13 | Disposition: A | Discharge status: Home / Self Care | Payer: Medicare Other | Source: Ambulatory Visit | Attending: Family | Admitting: Family

## 2014-08-13 ENCOUNTER — Encounter: Payer: Self-pay | Admitting: Family

## 2014-08-13 VITALS — BP 144/68 | HR 82 | Resp 16 | Ht 61.0 in | Wt 122.0 lb

## 2014-08-13 DIAGNOSIS — I739 Peripheral vascular disease, unspecified: Secondary | ICD-10-CM

## 2014-08-13 DIAGNOSIS — Z48812 Encounter for surgical aftercare following surgery on the circulatory system: Secondary | ICD-10-CM

## 2014-08-13 NOTE — Progress Notes (Signed)
[facility] [place] HISTORY AND PHYSICAL -PAD  History of Present Illness [name] [name] is a 68 y.o. female patient of Dr. Donnetta Hutching who is status post aortobifemoral bypass and left fem-pop grafting in 2010. She then had right fem-pop. bypass graft in March, 2013 with subsequent revision on 06/17/12.  She is also being monitored for carotid artery stenosis. She returns today for scheduled LE ABI's and left LE Duplex surveillance.  She states that she had a stress test and it was normal.  She has a history of hyperthyroidism with accompanying tachycardia, but she denies knowing of any atrial fib. She had a thyroid ablation in 2004 and has had no tachycardia since then. She denies any history of heart disease and has not had any cardiac interventions.  She states that her blood pressure is elevated in medical offices but is normally 115/60 in her left arm.  She had a lung biopsy on Oct. 8, 2014 for progressive scar tissue noted on imaging, followed by Dr. Gwenette Greet, pulmonologist, no malignancy found, she was mostly bed bound for 4 days due to being "hooked up".  She is taking prednisone for a short time post lung biopsy.  She was evaluated in Dr. Gwenette Greet office recently for dyspnea during 2 week period of rain. Dr. Peter Martinique is her cardiolgist.  Pt. denies claudication in lower extremities, denies rest pain, denies night pain, denies non healing ulcers on either extremity.   The patient denies tingling, numbness, or pain in either hand or arm.  Patient denies any history of TIA or stroke symptom, specifically she denies a history of amaurosis fugax or monocular blindness, denies a history of facial drooping, denies a history of hemiplegia, denies a history of receptive or expressive aphasia.   Pt Diabetic: No  Pt smoker: former smoker, quit 2002  Pt meds include:  Statin :Yes  ASA: Yes  Other anticoagulants/antiplatelets: no   Past Medical History  Diagnosis Date  .  Peripheral arterial disease   . Hyperlipidemia   . Thyroid disease     Hypothyroidism  . Hypothyroidism   . Blood transfusion     2 /12 yrs ago  . Arthritis   . Arrhythmia     when hyperthyroid  . Dyspnea on exertion 01/22/2013  . GERD (gastroesophageal reflux disease)     otc    Social History History  Substance Use Topics  . Smoking status: Former Smoker -- 1.00 packs/day for 20 years    Types: Cigarettes    Quit date: 09/07/1999  . Smokeless tobacco: Never Used  . Alcohol Use: 4.2 oz/week    7 Glasses of wine per week     Comment: wine-- 5 glasses a week    Family History Family History  Problem Relation Age of Onset  . Anesthesia problems Neg Hx   . Hypotension Neg Hx   . Malignant hyperthermia Neg Hx   . Pseudochol deficiency Neg Hx   . COPD Mother   . Cancer Mother     BREAST  . Cancer Father     LUNG  . Cancer Brother     Lymphoma  . Heart attack Brother   . Lymphoma Brother   . Heart disease Brother     Heart disease before age 24  . Cancer Brother     colon   . Cancer - Colon Sister   . Cancer Sister     Colon    Past Surgical History  Procedure Laterality Date  .  Pr vein bypass graft,aorto-fem-pop  03/02/09    Aortobifemoral BPG, Bilateral fem- popliteal BPG  . Hand surgery      cyst removed left hand  . Appendectomy      45 yrs ago  . Femoral-popliteal bypass graft  09/11/2011    Procedure: BYPASS GRAFT FEMORAL-POPLITEAL ARTERY;  Surgeon: Rosetta Posner, MD;  Location: [place];  Service: Vascular;  Laterality: Right;  Thrombectomy right femoral popliteal bypass with intraoperative arteriogram  . Embolectomy  09/12/2011    Procedure: EMBOLECTOMY;  Surgeon: Theotis Burrow, MD;  Location: [facility] OR;  Service: Vascular;  Laterality: Right;  Embolectomy Right Femoral -Popliteal Bypass Graft, Revision of Distal Anastomosis  . Femoral-popliteal bypass graft  09/11/2011    Procedure: BYPASS GRAFT FEMORAL-POPLITEAL ARTERY;  Surgeon: Rosetta Posner, MD;   Location: [place];  Service: Vascular;  Laterality: Right;  Thrombectomy right femoral popliteal bypass with intraoperative arteriogram  . Breast surgery      duct removed left breast  . Femoral-popliteal bypass graft  01/17/2012    Procedure: BYPASS GRAFT FEMORAL-POPLITEAL ARTERY;  Surgeon: Rosetta Posner, MD;  Location: [facility] OR;  Service: Vascular;  Laterality: Right;  right femoral to below knee popliteal bypass graft with vein  . Intraoperative arteriogram  01/17/2012    Procedure: INTRA OPERATIVE ARTERIOGRAM;  Surgeon: Rosetta Posner, MD;  Location: [facility] OR;  Service: Vascular;  Laterality: Right;  . Abdominal hysterectomy    . Femoral-popliteal bypass graft  06/17/2012    Procedure: BYPASS GRAFT FEMORAL-POPLITEAL ARTERY;  Surgeon: Rosetta Posner, MD;  Location: [facility] OR;  Service: Vascular;  Laterality: Right;  revision  . Cardiovascular stress test  May 2014  . Pulmonary stress test  July 2014  . Video assisted thoracoscopy Left 08/11/2013    Procedure: VIDEO ASSISTED THORACOSCOPY;  Surgeon: Melrose Nakayama, MD;  Location: [place];  Service: Thoracic;  Laterality: Left;  LEFT  VATS, LUNG BX  . Lung biopsy  Oct. 2014  . Cataract extraction    . Eye surgery      Allergies  Allergen Reactions  . Morphine And Related Nausea And Vomiting    Current Outpatient Prescriptions  Medication Sig Dispense Refill  . acetaminophen (TYLENOL) 500 MG tablet Take 500 mg by mouth daily as needed. For headache      . ALPRAZolam (XANAX) 0.25 MG tablet Take 0.25 mg by mouth at bedtime as needed for sleep or anxiety. For anxiety/sleep      . aspirin EC 81 MG tablet Take 81 mg by mouth at bedtime.      Marland Kitchen dextromethorphan-guaiFENesin (MUCINEX DM) 30-600 MG per 12 hr tablet Take 1 tablet by mouth 2 (two) times daily as needed for cough.      . levothyroxine (SYNTHROID, LEVOTHROID) 112 MCG tablet Take 112 mcg by mouth daily.       . predniSONE (DELTASONE) 10 MG tablet TAKE 1 TABLET (20 MG TOTAL) BY MOUTH AS DIRECTED.       Marland Kitchen simvastatin (ZOCOR) 40 MG tablet Take 40 mg by mouth at bedtime.       Marland Kitchen CALCIUM PO Take 2,000 Units by mouth daily.      . predniSONE (DELTASONE) 10 MG tablet 1-2 daily as directed  60 tablet  1   No current facility-administered medications for this visit.    ROS: See HPI for pertinent positives and negatives.   Physical Examination  Filed Vitals:   08/13/14 1212  BP: 144/68  Pulse: 82  Resp: 16  Height: 5\' 1"  (1.549 m)  Weight: 122 lb (55.339 kg)  SpO2: 98%   Body mass index is 23.06 kg/(m^2).  General: WDWN in NAD  Gait: Normal  HENT: WNL  Eyes: Pupils equal  Pulmonary: normal non-labored breathing , without Rales, rhonchi, wheezing  Cardiac: RRR, no murmurs detected  Abdomen: soft, NT, no masses palpated  Skin: no rashes, ulcers noted; no Gangrene , no cellulitis; no open wounds.  VASCULAR EXAM  Carotid Bruits  Left  Right    positive Negative    Radial pulses: right is faintly palpable, right brachial is 2+ palpable, left radial is 2+ palpable Aorta is not palpable  VASCULAR EXAM:  Extremities without ischemic changes  without Gangrene; without open wounds.   LE Pulses  LEFT  RIGHT   FEMORAL  1+palpable  2+palpable   POPLITEAL  not palpable  not palpable   POSTERIOR TIBIAL  not palpable  2+palpable   DORSALIS PEDIS  ANTERIOR TIBIAL  not palpable  2+palpable    Musculoskeletal: no muscle wasting or atrophy; no edema  Neurologic: A&O X 3; Appropriate Affect ;  SENSATION: normal;  MOTOR FUNCTION: 5/5 Symmetric, CN 2-12 intact  Speech is fluent/normal    Non-Invasive Vascular Imaging: DATE: 08/13/2014 LOWER EXTREMITY ARTERIAL DUPLEX EVALUATION    INDICATION: PVD    PREVIOUS INTERVENTION(S): Aorta bifemoral bypass graft and bilateral femoral to popliteal bypass graft 03/02/2009, right femoral to popliteal graft redo 01/17/2012, revision of the proximal and distal anastomosis of the right femoral  to popliteal bypass 06/07/2012    DUPLEX EXAM:      RIGHT  LEFT   Peak Systolic Velocity (cm/s) Ratio (if abnormal) Waveform  Peak Systolic Velocity (cm/s) Ratio (if abnormal) Waveform     Inflow Artery 64  B     Proximal Anastomosis 38  T     Proximal Graft Occlusion  -     Mid Graft Occlusion  -      Distal Graft Occlusion  -     Distal Anastomosis 52  B     Outflow Artery 34  M  1.16 Today's ABI / TBI .69  1.0 Previous ABI / TBI (  01/20/14) .62    Waveform:    M - Monophasic       B - Biphasic       T - Triphasic  If Ankle Brachial Index (ABI) or Toe Brachial Index (TBI) performed, please see complete report     ADDITIONAL FINDINGS:     IMPRESSION: 1. Known occluded left femoral to popliteal bypass graft with a collateral visualized at the distal anastomosis/native artery segment.    Compared to the previous exam:  No significant  change    ASSESSMENT: [name] [name] is a 68 y.o. female who is status post aortobifemoral bypass and left fem-pop. grafting in 2010. She then had right fem-pop. bypass graft in March, 2013 with subsequent revision on 06/17/12.  She has no LE claudication symptoms, no tissue loss. At her evaluation in March, 2015 it was found on Duplex that the left fem-pop graft was occluded, and the left anterior tibial artery was occluded, but she has no claudication symptoms. Six months prior to that both LE bypass grafts were patent.   PLAN:  Continue graduated walking program and maximal medical management.  I discussed in depth with the patient the nature of atherosclerosis, and emphasized the importance of maximal medical management including strict control of blood pressure, blood glucose, and lipid levels, obtaining regular  exercise, and continued cessation of smoking.  The patient is aware that without maximal medical management the underlying atherosclerotic disease process will progress, limiting the benefit of any interventions.  Based on the patient's vascular studies and examination, pt will return to  clinic in 6 months for ABI's, left LE arterial Duplex, and carotid Duplex (already scheduled).  The patient was given information about PAD including signs, symptoms, treatment, what symptoms should prompt the patient to seek immediate medical care, and risk reduction measures to take.  Clemon Chambers, RN, MSN, FNP-C [facility] Phone: [phone]  Clinic MD: Oneida Alar  08/13/2014 12:16 PM

## 2014-08-13 NOTE — Patient Instructions (Signed)
Peripheral Vascular Disease Peripheral Vascular Disease (PVD), also called Peripheral Arterial Disease (PAD), is a circulation problem caused by cholesterol (atherosclerotic plaque) deposits in the arteries. PVD commonly occurs in the lower extremities (legs) but it can occur in other areas of the body, such as your arms. The cholesterol buildup in the arteries reduces blood flow which can cause pain and other serious problems. The presence of PVD can place a person at risk for Coronary Artery Disease (CAD).  CAUSES  Causes of PVD can be many. It is usually associated with more than one risk factor such as:   High Cholesterol.  Smoking.  Diabetes.  Lack of exercise or inactivity.  High blood pressure (hypertension).  Obesity.  Family history. SYMPTOMS   When the lower extremities are affected, patients with PVD may experience:  Leg pain with exertion or physical activity. This is called INTERMITTENT CLAUDICATION. This may present as cramping or numbness with physical activity. The location of the pain is associated with the level of blockage. For example, blockage at the abdominal level (distal abdominal aorta) may result in buttock or hip pain. Lower leg arterial blockage may result in calf pain.  As PVD becomes more severe, pain can develop with less physical activity.  In people with severe PVD, leg pain may occur at rest.  Other PVD signs and symptoms:  Leg numbness or weakness.  Coldness in the affected leg or foot, especially when compared to the other leg.  A change in leg color.  Patients with significant PVD are more prone to ulcers or sores on toes, feet or legs. These may take longer to heal or may reoccur. The ulcers or sores can become infected.  If signs and symptoms of PVD are ignored, gangrene may occur. This can result in the loss of toes or loss of an entire limb.  Not all leg pain is related to PVD. Other medical conditions can cause leg pain such  as:  Blood clots (embolism) or Deep Vein Thrombosis.  Inflammation of the blood vessels (vasculitis).  Spinal stenosis. DIAGNOSIS  Diagnosis of PVD can involve several different types of tests. These can include:  Pulse Volume Recording Method (PVR). This test is simple, painless and does not involve the use of X-rays. PVR involves measuring and comparing the blood pressure in the arms and legs. An ABI (Ankle-Brachial Index) is calculated. The normal ratio of blood pressures is 1. As this number becomes smaller, it indicates more severe disease.  < 0.95 - indicates significant narrowing in one or more leg vessels.  <0.8 - there will usually be pain in the foot, leg or buttock with exercise.  <0.4 - will usually have pain in the legs at rest.  <0.25 - usually indicates limb threatening PVD.  Doppler detection of pulses in the legs. This test is painless and checks to see if you have a pulses in your legs/feet.  A dye or contrast material (a substance that highlights the blood vessels so they show up on x-ray) may be given to help your caregiver better see the arteries for the following tests. The dye is eliminated from your body by the kidney's. Your caregiver may order blood work to check your kidney function and other laboratory values before the following tests are performed:  Magnetic Resonance Angiography (MRA). An MRA is a picture study of the blood vessels and arteries. The MRA machine uses a large magnet to produce images of the blood vessels.  Computed Tomography Angiography (CTA). A CTA   is a specialized x-ray that looks at how the blood flows in your blood vessels. An IV may be inserted into your arm so contrast dye can be injected.  Angiogram. Is a procedure that uses x-rays to look at your blood vessels. This procedure is minimally invasive, meaning a small incision (cut) is made in your groin. A small tube (catheter) is then inserted into the artery of your groin. The catheter  is guided to the blood vessel or artery your caregiver wants to examine. Contrast dye is injected into the catheter. X-rays are then taken of the blood vessel or artery. After the images are obtained, the catheter is taken out. TREATMENT  Treatment of PVD involves many interventions which may include:  Lifestyle changes:  Quitting smoking.  Exercise.  Following a low fat, low cholesterol diet.  Control of diabetes.  Foot care is very important to the PVD patient. Good foot care can help prevent infection.  Medication:  Cholesterol-lowering medicine.  Blood pressure medicine.  Anti-platelet drugs.  Certain medicines may reduce symptoms of Intermittent Claudication.  Interventional/Surgical options:  Angioplasty. An Angioplasty is a procedure that inflates a balloon in the blocked artery. This opens the blocked artery to improve blood flow.  Stent Implant. A wire mesh tube (stent) is placed in the artery. The stent expands and stays in place, allowing the artery to remain open.  Peripheral Bypass Surgery. This is a surgical procedure that reroutes the blood around a blocked artery to help improve blood flow. This type of procedure may be performed if Angioplasty or stent implants are not an option. SEEK IMMEDIATE MEDICAL CARE IF:   You develop pain or numbness in your arms or legs.  Your arm or leg turns cold, becomes blue in color.  You develop redness, warmth, swelling and pain in your arms or legs. MAKE SURE YOU:   Understand these instructions.  Will watch your condition.  Will get help right away if you are not doing well or get worse. Document Released: 11/23/2004 Document Revised: 01/08/2012 Document Reviewed: 10/20/2008 ExitCare Patient Information 2015 [employer]. This information is not intended to replace advice given to you by your health care provider. Make sure you discuss any questions you have with your health care provider.   Stroke  Prevention Some medical conditions and behaviors are associated with an increased chance of having a stroke. You may prevent a stroke by making healthy choices and managing medical conditions. HOW CAN I REDUCE MY RISK OF HAVING A STROKE?   Stay physically active. Get at least 30 minutes of activity on most or all days.  Do not smoke. It may also be helpful to avoid exposure to secondhand smoke.  Limit alcohol use. Moderate alcohol use is considered to be:  No more than 2 drinks per day for men.  No more than 1 drink per day for nonpregnant women.  Eat healthy foods. This involves:  Eating 5 or more servings of fruits and vegetables a day.  Making dietary changes that address high blood pressure (hypertension), high cholesterol, diabetes, or obesity.  Manage your cholesterol levels.  Making food choices that are high in fiber and low in saturated fat, trans fat, and cholesterol may control cholesterol levels.  Take any prescribed medicines to control cholesterol as directed by your health care provider.  Manage your diabetes.  Controlling your carbohydrate and sugar intake is recommended to manage diabetes.  Take any prescribed medicines to control diabetes as directed by your health care provider.    Control your hypertension.  Making food choices that are low in salt (sodium), saturated fat, trans fat, and cholesterol is recommended to manage hypertension.  Take any prescribed medicines to control hypertension as directed by your health care provider.  Maintain a healthy weight.  Reducing calorie intake and making food choices that are low in sodium, saturated fat, trans fat, and cholesterol are recommended to manage weight.  Stop drug abuse.  Avoid taking birth control pills.  Talk to your health care provider about the risks of taking birth control pills if you are over 35 years old, smoke, get migraines, or have ever had a blood clot.  Get evaluated for sleep  disorders (sleep apnea).  Talk to your health care provider about getting a sleep evaluation if you snore a lot or have excessive sleepiness.  Take medicines only as directed by your health care provider.  For some people, aspirin or blood thinners (anticoagulants) are helpful in reducing the risk of forming abnormal blood clots that can lead to stroke. If you have the irregular heart rhythm of atrial fibrillation, you should be on a blood thinner unless there is a good reason you cannot take them.  Understand all your medicine instructions.  Make sure that other conditions (such as anemia or atherosclerosis) are addressed. SEEK IMMEDIATE MEDICAL CARE IF:   You have sudden weakness or numbness of the face, arm, or leg, especially on one side of the body.  Your face or eyelid droops to one side.  You have sudden confusion.  You have trouble speaking (aphasia) or understanding.  You have sudden trouble seeing in one or both eyes.  You have sudden trouble walking.  You have dizziness.  You have a loss of balance or coordination.  You have a sudden, severe headache with no known cause.  You have new chest pain or an irregular heartbeat. Any of these symptoms may represent a serious problem that is an emergency. Do not wait to see if the symptoms will go away. Get medical help at once. Call your local emergency services (911 in [place] Do not drive yourself to the hospital. Document Released: 11/23/2004 Document Revised: 03/02/2014 Document Reviewed: 04/18/2013 ExitCare Patient Information 2015 [employer]. This information is not intended to replace advice given to you by your health care provider. Make sure you discuss any questions you have with your health care provider.  

## 2014-08-13 NOTE — Addendum Note (Signed)
Addended by: Mena Goes on: 08/13/2014 03:39 PM   Modules accepted: Orders

## 2014-09-02 ENCOUNTER — Ambulatory Visit: Payer: Medicare Other | Admitting: Pulmonary Disease

## 2014-09-15 ENCOUNTER — Encounter: Payer: Self-pay | Admitting: Pulmonary Disease

## 2014-09-15 ENCOUNTER — Telehealth: Payer: Self-pay | Admitting: Pulmonary Disease

## 2014-09-15 ENCOUNTER — Ambulatory Visit (INDEPENDENT_AMBULATORY_CARE_PROVIDER_SITE_OTHER): Payer: Medicare Other | Admitting: Pulmonary Disease

## 2014-09-15 VITALS — BP 138/70 | HR 102 | Temp 98.0°F | Ht 60.5 in | Wt 123.2 lb

## 2014-09-15 DIAGNOSIS — J849 Interstitial pulmonary disease, unspecified: Secondary | ICD-10-CM

## 2014-09-15 DIAGNOSIS — Z23 Encounter for immunization: Secondary | ICD-10-CM

## 2014-09-15 MED ORDER — MYCOPHENOLATE MOFETIL 500 MG PO TABS: ORAL_TABLET | ORAL | Status: DC

## 2014-09-15 MED ORDER — SULFAMETHOXAZOLE-TRIMETHOPRIM 800-160 MG PO TABS: ORAL_TABLET | ORAL | Status: DC

## 2014-09-15 NOTE — Progress Notes (Signed)
   Subjective:    Patient ID: [name] [name], female    DOB: 11/17/1945, 68 y.o.   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 She has had improvement with prednisone alone, but had a flare most recently that required escalation of dose. Her symptoms were stabilized, and she has been weaned back to 10 mg a day. However, she never got back to her usual baseline, and is very dissatisfied with her current quality of life. She denies any significant congestion, and has minimal cough with mucus. She has not had worsening lower extremity edema.   Review of Systems  Constitutional: Negative for fever and unexpected weight change.  HENT: Positive for congestion and postnasal drip. Negative for dental problem, ear pain, nosebleeds, rhinorrhea, sinus pressure, sneezing, sore throat and trouble swallowing.   Eyes: Negative for redness and itching.  Respiratory: Positive for cough and shortness of breath. Negative for chest tightness and wheezing.   Cardiovascular: Negative for palpitations and leg swelling.  Gastrointestinal: Negative for nausea and vomiting.  Genitourinary: Negative for dysuria.  Musculoskeletal: Negative for joint swelling.  Skin: Negative for rash.  Neurological: Negative for headaches.  Hematological: Does not bruise/bleed easily.  Psychiatric/Behavioral: Negative for dysphoric mood. The patient is not nervous/anxious.        Objective:   Physical Exam Well-developed female in no acute distress Nose without purulence or discharge noted Neck without lymphadenopathy or thyromegaly Chest with crackles one third the way up bilaterally, no wheezing Cardiac exam with regular rate and rhythm Lower extremities without significant edema, no cyanosis Alert and oriented, moves all 4 extremities.       Assessment & Plan:

## 2014-09-15 NOTE — Assessment & Plan Note (Addendum)
The patient has had a recent flare of her lung disease that required escalation of her prednisone dose. She did have stabilization of her symptoms, and has been weaned back to maintenance at 10 mg a day. Even though she has improved, she is not satisfied with her current level of breathing, and we have discussed more aggressive immunosuppression. 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 Will also provide pneumocystis chemoprophylaxis. Finally, I have recommended that she go to pulmonary rehabilitation in order to improve her conditioning and quality of life. I will see her back in 4 weeks, and will check blood work since starting the CellCept.

## 2014-09-15 NOTE — Addendum Note (Signed)
Addended by: Inge Rise on: 09/15/2014 12:30 PM   Modules accepted: Orders

## 2014-09-15 NOTE — Telephone Encounter (Signed)
Pt states that she was Rxd Cellcept at [place] this morning and went to pick up Rx and insurance is requiring Prior Auth  Approval process can be expedited with a phone call from the Doctor [phone]  Patient Instructions 09/15/14    Stay on prednisone at 10mg  a day Start on cellcept 500mg  each am for one week, then increase to 500mg  am and pm for one week, then increase to 1000mg  in am and 500mg  in pm for one week, then increase to 1000mg  am and pm and stay there.  Will refer to pulmonary rehab program Bactrim DS one in the am for three days a week (MWF) followup with me in 4 weeks, but call if having issues.    Please advise Dr Gwenette Greet if you want [place] to move forward PA or if alternative can be offered. Thanks

## 2014-09-15 NOTE — Telephone Encounter (Signed)
See if there is a form they can fax that we can fill out and send back.

## 2014-09-15 NOTE — Patient Instructions (Addendum)
Stay on prednisone at 10mg  a day Start on cellcept 500mg  each am for one week, then increase to 500mg  am and pm for one week, then increase to 1000mg  in am and 500mg  in pm for one week, then increase to 1000mg  am and pm and stay there.  Will refer to pulmonary rehab program Bactrim DS one in the am for three days a week  (MWF) followup with me in 4 weeks, but call if having issues.

## 2014-09-15 NOTE — Telephone Encounter (Signed)
Pt states that her insurance requires a prior auth for her meds.  Pt requests we call her insurance directly to take care of this.

## 2014-09-16 NOTE — Telephone Encounter (Signed)
[facility] is faxing form over for [facility] to fill out as he requested. Will hold in triage until fax comes over-faxing forms to Triage fax number.

## 2014-09-17 NOTE — Telephone Encounter (Signed)
Forms not on triage fax. Mindy do you have these forms?

## 2014-09-17 NOTE — Telephone Encounter (Signed)
i have no forms thanks

## 2014-09-17 NOTE — Telephone Encounter (Signed)
Pt called to check on the status of the PA. I advised where we were in the process. She states understanding. Dover Bing, CMA

## 2014-09-17 NOTE — Telephone Encounter (Signed)
Called and spoke to Sharon and [employer] to initiate PA for Cellcept 500mg . Advised this will go to clinical review for 24-72 hours.  Last OV note sent [phone]. PA # [id number]  Will forward to [place] to f/u on.

## 2014-09-21 ENCOUNTER — Telehealth (HOSPITAL_COMMUNITY): Payer: Self-pay | Admitting: *Deleted

## 2014-09-22 NOTE — Telephone Encounter (Signed)
Called [phone] to check on PA for cellcept. This was denied bc the use is not supported by the FDA for one of the medicare references for treating the medical condition. We will need to appeal this if the doctor wants to. We would need to fax an appeal letter and notes to [phone]. Please advise Dr. Gwenette Greet

## 2014-09-22 NOTE — Telephone Encounter (Signed)
done

## 2014-09-22 NOTE — Telephone Encounter (Signed)
I have faxed note and OV notes over to [phone]. Will await response

## 2014-09-25 NOTE — Telephone Encounter (Signed)
Called [employer] to check status of appeal, it is still being reviewed.  Will follow up on.

## 2014-09-30 NOTE — Telephone Encounter (Signed)
Called [phone] to check on status. Was advised the appeal is still in process and can take up to 90 days to get a response.  Called spoke with pt. She reports she has paid out of pocket for this medication at [facility] $283.00. She reports she found a coupon and can get this medication for $26 at [facility]. She will call when she needs this refilled. Will await appeal decision.

## 2014-09-30 NOTE — Telephone Encounter (Signed)
Have there been any updates on patients appeal. Thanks.

## 2014-10-08 ENCOUNTER — Encounter (HOSPITAL_COMMUNITY): Payer: Self-pay | Admitting: Vascular Surgery

## 2014-10-12 ENCOUNTER — Telehealth (HOSPITAL_COMMUNITY): Payer: Self-pay

## 2014-10-13 ENCOUNTER — Encounter: Payer: Self-pay | Admitting: Pulmonary Disease

## 2014-10-13 ENCOUNTER — Other Ambulatory Visit (INDEPENDENT_AMBULATORY_CARE_PROVIDER_SITE_OTHER): Payer: Medicare Other

## 2014-10-13 ENCOUNTER — Ambulatory Visit (INDEPENDENT_AMBULATORY_CARE_PROVIDER_SITE_OTHER): Payer: Medicare Other | Admitting: Pulmonary Disease

## 2014-10-13 VITALS — BP 142/64 | HR 107 | Temp 98.0°F | Ht 60.0 in | Wt 120.2 lb

## 2014-10-13 DIAGNOSIS — J849 Interstitial pulmonary disease, unspecified: Secondary | ICD-10-CM

## 2014-10-13 LAB — CBC WITH DIFFERENTIAL/PLATELET
BASOS PCT: 0.5 % (ref 0.0–3.0)
Eosinophils Relative: 2.3 % (ref 0.0–5.0)
HCT: 48.3 % — ABNORMAL HIGH (ref 36.0–46.0)
Hemoglobin: 15.7 g/dL — ABNORMAL HIGH (ref 12.0–15.0)
Lymphocytes Relative: 5.6 % — ABNORMAL LOW (ref 12.0–46.0)
MCHC: 32.5 g/dL (ref 30.0–36.0)
MCV: 85.1 fl (ref 78.0–100.0)
Monocytes Relative: 6.4 % (ref 3.0–12.0)
Platelets: 232 10*3/uL (ref 150.0–400.0)
RBC: 5.67 Mil/uL — AB (ref 3.87–5.11)
RDW: 15.8 % — ABNORMAL HIGH (ref 11.5–15.5)
WBC: 15.9 10*3/uL — ABNORMAL HIGH (ref 4.0–10.5)

## 2014-10-13 LAB — BASIC METABOLIC PANEL
BUN: 11 mg/dL (ref 6–23)
CHLORIDE: 102 meq/L (ref 96–112)
CO2: 23 meq/L (ref 19–32)
CREATININE: 1 mg/dL (ref 0.4–1.2)
Calcium: 9.1 mg/dL (ref 8.4–10.5)
GFR: 61.37 mL/min (ref >60.00)
Glucose, Bld: 163 mg/dL — ABNORMAL HIGH (ref 70–99)
POTASSIUM: 3.9 meq/L (ref 3.5–5.1)
SODIUM: 134 meq/L — AB (ref 135–145)

## 2014-10-13 LAB — HEPATIC FUNCTION PANEL
ALK PHOS: 61 U/L (ref 39–117)
ALT: 21 U/L (ref 0–35)
AST: 25 U/L (ref 0–37)
Albumin: 4 g/dL (ref 3.5–5.2)
BILIRUBIN DIRECT: 0.2 mg/dL (ref 0.0–0.3)
Total Bilirubin: 0.7 mg/dL (ref 0.2–1.2)
Total Protein: 7.1 g/dL (ref 6.0–8.3)

## 2014-10-13 IMAGING — CR DG CHEST 2V
2 series · 2 of 2 positions shown · non-contrast
Comparison: PA and lateral chest x-ray June 03, 2014

CLINICAL DATA: One week history of cough; history of interstitial
lung disease and previous tobacco use

EXAM:
CHEST  2 VIEW

[view not recorded (1 of 2)]
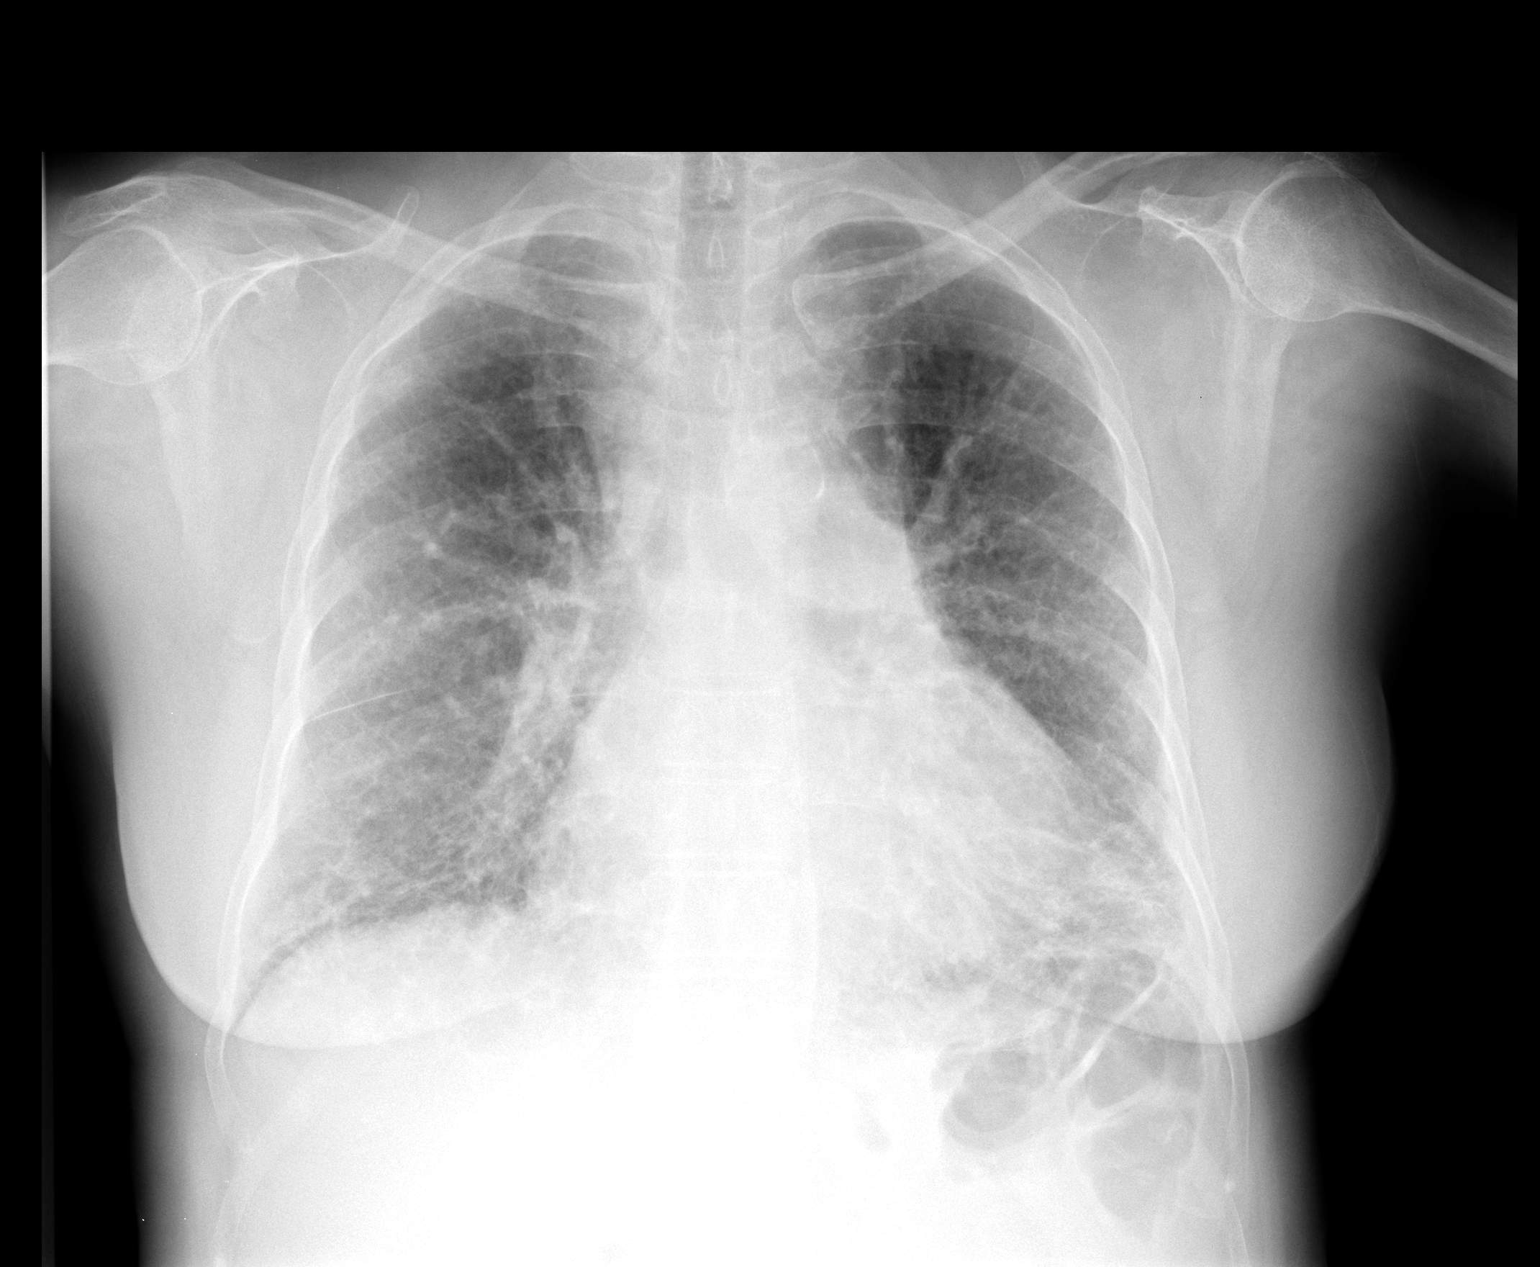

[view not recorded (2 of 2)]
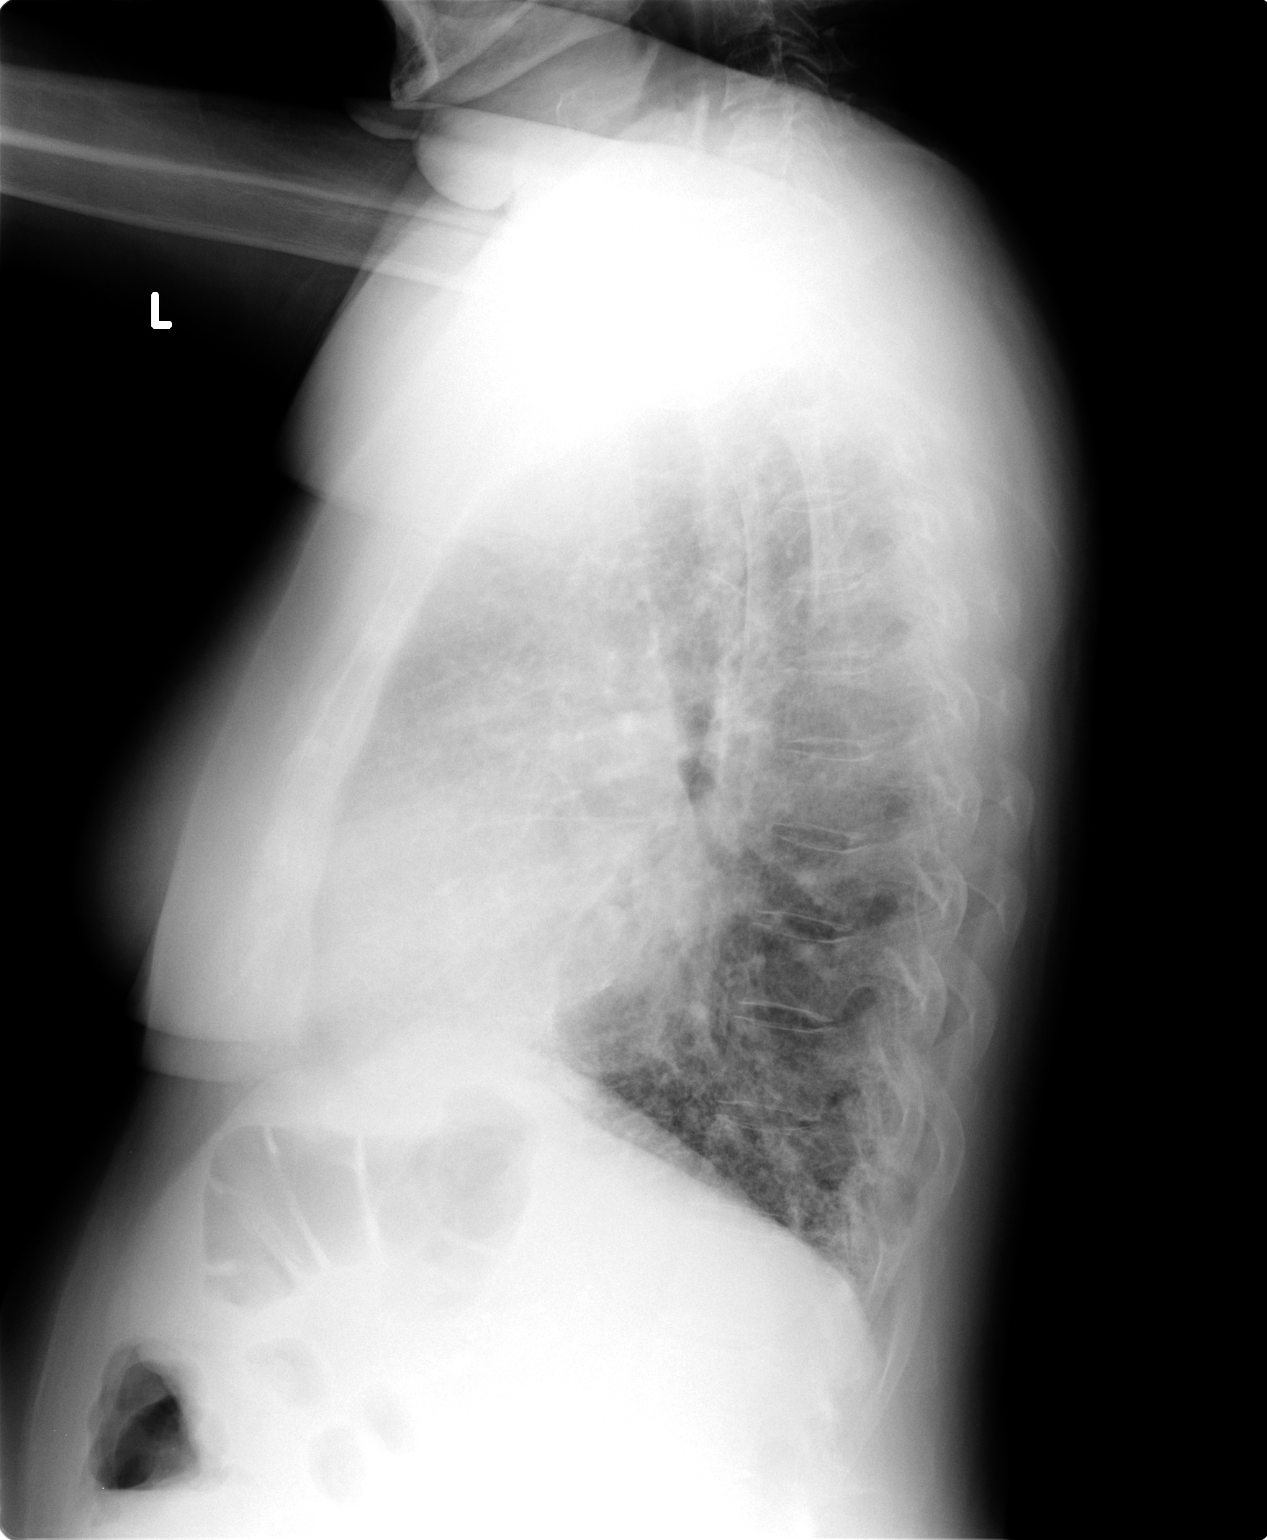

[2 of 2 positions shown; findings below may reference images not displayed]

FINDINGS: The lungs are reasonably well inflated. The interstitial markings
are coarse bilaterally. Increased density at the left lung base is
stable. The cardiopericardial silhouette is mildly enlarged but
stable. The pulmonary vascularity is mildly prominent centrally. The
bony structures are unremarkable.
IMPRESSION: There is extensive chronic interstitial change within both lungs
especially at the left lung base. There may be low-grade compensated
CHF. There is no evidence of acute pneumonia. One cannot exclude
acute bronchitis in the appropriate clinical setting.

## 2014-10-13 MED ORDER — MYCOPHENOLATE MOFETIL 500 MG PO TABS: ORAL_TABLET | ORAL | Status: DC

## 2014-10-13 MED ORDER — PREDNISONE 10 MG PO TABS: ORAL_TABLET | ORAL | Status: DC

## 2014-10-13 MED ORDER — SULFAMETHOXAZOLE-TRIMETHOPRIM 800-160 MG PO TABS: ORAL_TABLET | ORAL | Status: DC

## 2014-10-13 NOTE — Progress Notes (Signed)
   Subjective:    Patient ID: [name] [name], female    DOB: 1946/05/14, 67 y.o.   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  She has been started on CellCept, and has been increased to the final dose of 2000 mg a day, and is continuing on prednisone 10 mg a day. She feels that her breathing has actually gotten a little bit worse overall since the last visit, but she actually does have days where she sees significant improvement. I suspect that it is taking time for her CellCept to equilibrate. She is having significant desaturations with exercise, and we will work with her home care company to get her an Medical laboratory scientific officer. She has very little cough, and almost no mucus production. She does not feel chest congestion, nor does she think she has a chest cold. She is due for her blood work today to monitor her CellCept regimen.   Review of Systems  Constitutional: Negative for fever and unexpected weight change.  HENT: Negative for congestion, dental problem, ear pain, nosebleeds, postnasal drip, rhinorrhea, sinus pressure, sneezing, sore throat and trouble swallowing.   Eyes: Negative for redness and itching.  Respiratory: Positive for cough and shortness of breath. Negative for chest tightness and wheezing.   Cardiovascular: Negative for palpitations and leg swelling.  Gastrointestinal: Negative for nausea and vomiting.  Genitourinary: Negative for dysuria.  Musculoskeletal: Negative for joint swelling.  Skin: Negative for rash.  Neurological: Negative for headaches.  Hematological: Does not bruise/bleed easily.  Psychiatric/Behavioral: Negative for dysphoric mood. The patient is not nervous/anxious.        Objective:   Physical Exam Well-developed female in no acute distress Nose without purulence or discharge noted Neck without lymphadenopathy or thyromegaly Chest with  bibasilar crackles, no wheezing. Cardiac exam with regular rate and rhythm Lower extremities without edema, no cyanosis Alert and oriented, moves all 4 extremities.       Assessment & Plan:

## 2014-10-13 NOTE — Patient Instructions (Signed)
Increase prednisone to 40mg  a day for next 2 weeks, then decrease to 30mg  daily for 2 weeks, then 20mg  daily for 2 weeks and stay there. Continue cellcept and bactrim Will check blood work today per cellcept protocol.  Will need to repeat in 2 weeks. Will send an order to [facility] to see if they have an oximizer (oxygen conserving device) to use with your portable tanks Look up respironics "easy go", but will need to have model with continuous and pulsed settings. Go ahead and start pulmonary rehab. followup with me again in 4-6 weeks.

## 2014-10-13 NOTE — Telephone Encounter (Signed)
Pt seen in office today. She is going to take RX to [facility] with coupon since she can get this cheaper.

## 2014-10-13 NOTE — Assessment & Plan Note (Signed)
The patient is currently on CellCept at full dose, along with PCP chemoprophylaxis. She feels that she is starting to make a little headway in terms of symptoms, but still struggles with her daily activities because of dyspnea. She also is continuing to desaturate significantly with exertional activities. At this point, I would like to increase her prednisone until we can see some stabilization and let the CellCept equilibrate. The goal will then be to keep her prednisone dose below 20 mg a day. I have also asked her to increase her oxygen level with exertion, and we'll see if we can get her an Oxymizer. I've also encouraged her to go ahead and enroll in the pulmonary rehabilitation program, and they can work with her on her exercise-induced hypoxemia. She is also due for her blood work today to make sure everything is going okay with the CellCept.

## 2014-10-13 NOTE — Addendum Note (Signed)
Addended by: Inge Rise on: 10/13/2014 11:16 AM   Modules accepted: Orders

## 2014-10-13 NOTE — Addendum Note (Signed)
Addended by: Inge Rise on: 10/13/2014 11:31 AM   Modules accepted: Orders

## 2014-10-14 ENCOUNTER — Other Ambulatory Visit: Payer: Self-pay | Admitting: Pulmonary Disease

## 2014-10-27 ENCOUNTER — Other Ambulatory Visit (INDEPENDENT_AMBULATORY_CARE_PROVIDER_SITE_OTHER): Payer: Medicare Other

## 2014-10-27 ENCOUNTER — Telehealth (HOSPITAL_COMMUNITY): Payer: Self-pay | Admitting: *Deleted

## 2014-10-27 DIAGNOSIS — J849 Interstitial pulmonary disease, unspecified: Secondary | ICD-10-CM

## 2014-10-27 LAB — BASIC METABOLIC PANEL
BUN: 14 mg/dL (ref 6–23)
CALCIUM: 9.4 mg/dL (ref 8.4–10.5)
CHLORIDE: 101 meq/L (ref 96–112)
CO2: 26 mEq/L (ref 19–32)
CREATININE: 1 mg/dL (ref 0.4–1.2)
GFR: 57.22 mL/min — ABNORMAL LOW (ref >60.00)
Glucose, Bld: 105 mg/dL — ABNORMAL HIGH (ref 70–99)
Potassium: 4.5 mEq/L (ref 3.5–5.1)
Sodium: 136 mEq/L (ref 135–145)

## 2014-10-27 LAB — HEPATIC FUNCTION PANEL
ALT: 16 U/L (ref 0–35)
AST: 18 U/L (ref 0–37)
Albumin: 4.1 g/dL (ref 3.5–5.2)
Alkaline Phosphatase: 58 U/L (ref 39–117)
Bilirubin, Direct: 0.1 mg/dL (ref 0.0–0.3)
Total Bilirubin: 0.6 mg/dL (ref 0.2–1.2)
Total Protein: 7.2 g/dL (ref 6.0–8.3)

## 2014-10-27 LAB — CBC WITH DIFFERENTIAL/PLATELET
BASOS PCT: 0.4 % (ref 0.0–3.0)
Basophils Absolute: 0.1 10*3/uL (ref 0.0–0.1)
EOS PCT: 1.4 % (ref 0.0–5.0)
Eosinophils Absolute: 0.2 10*3/uL (ref 0.0–0.7)
HEMATOCRIT: 53 % — AB (ref 36.0–46.0)
Hemoglobin: 16.9 g/dL — ABNORMAL HIGH (ref 12.0–15.0)
LYMPHS PCT: 14.3 % (ref 12.0–46.0)
Lymphs Abs: 2.6 10*3/uL (ref 0.7–4.0)
MCHC: 31.8 g/dL (ref 30.0–36.0)
MCV: 85.7 fl (ref 78.0–100.0)
Monocytes Absolute: 1.2 10*3/uL — ABNORMAL HIGH (ref 0.1–1.0)
Monocytes Relative: 6.7 % (ref 3.0–12.0)
NEUTROS PCT: 77.2 % — AB (ref 43.0–77.0)
Neutro Abs: 13.8 10*3/uL — ABNORMAL HIGH (ref 1.4–7.7)
Platelets: 234 10*3/uL (ref 150.0–400.0)
RBC: 6.19 Mil/uL — ABNORMAL HIGH (ref 3.87–5.11)
RDW: 15.8 % — ABNORMAL HIGH (ref 11.5–15.5)
WBC: 17.9 10*3/uL — AB (ref 4.0–10.5)

## 2014-10-29 ENCOUNTER — Telehealth (HOSPITAL_COMMUNITY): Payer: Self-pay | Admitting: *Deleted

## 2014-11-05 ENCOUNTER — Telehealth (HOSPITAL_COMMUNITY): Payer: Self-pay | Admitting: *Deleted

## 2014-11-18 ENCOUNTER — Other Ambulatory Visit (INDEPENDENT_AMBULATORY_CARE_PROVIDER_SITE_OTHER): Payer: Medicare Other

## 2014-11-18 ENCOUNTER — Encounter: Payer: Self-pay | Admitting: Pulmonary Disease

## 2014-11-18 ENCOUNTER — Ambulatory Visit (INDEPENDENT_AMBULATORY_CARE_PROVIDER_SITE_OTHER): Payer: Medicare Other | Admitting: Pulmonary Disease

## 2014-11-18 VITALS — BP 126/70 | HR 100 | Temp 97.0°F | Ht 61.0 in | Wt 119.6 lb

## 2014-11-18 DIAGNOSIS — J849 Interstitial pulmonary disease, unspecified: Secondary | ICD-10-CM

## 2014-11-18 LAB — BASIC METABOLIC PANEL
BUN: 13 mg/dL (ref 6–23)
CHLORIDE: 102 meq/L (ref 96–112)
CO2: 28 mEq/L (ref 19–32)
Calcium: 9.4 mg/dL (ref 8.4–10.5)
Creatinine, Ser: 0.94 mg/dL (ref 0.40–1.20)
GFR: 62.87 mL/min (ref >60.00)
GLUCOSE: 140 mg/dL — AB (ref 70–99)
POTASSIUM: 3.8 meq/L (ref 3.5–5.1)
Sodium: 138 mEq/L (ref 135–145)

## 2014-11-18 LAB — HEPATIC FUNCTION PANEL
ALT: 14 U/L (ref 0–35)
AST: 17 U/L (ref 0–37)
Albumin: 4.1 g/dL (ref 3.5–5.2)
Alkaline Phosphatase: 60 U/L (ref 39–117)
BILIRUBIN DIRECT: 0.1 mg/dL (ref 0.0–0.3)
Total Bilirubin: 0.6 mg/dL (ref 0.2–1.2)
Total Protein: 6.8 g/dL (ref 6.0–8.3)

## 2014-11-18 NOTE — Patient Instructions (Signed)
Stay on cellcept and bactrim. Reduce prednisone in the next week or so to 17.5mg  a day.  If doing well after 2 weeks, you can consider decreasing to 15mg /day and stay there until next visit. Will check you monitoring bloodwork today followup with me again in 78mos, but will need to get bloodwork done monthly.

## 2014-11-18 NOTE — Progress Notes (Signed)
   Subjective:    Patient ID: [name] [name], female    DOB: 1946/09/22, 69 y.o.   MRN: [id number]  HPI The patient comes in today for follow-up of her known interstitial lung disease with chronic respiratory failure. This is felt secondary to a fibrosing variant of nonspecific interstitial pneumonitis. She has maintained on CellCept with PCP prophylaxis, and prednisone was increased at the last visit because of worsening dyspnea on exertion and hypoxemia. She has done much better on the higher doses of prednisone, and is currently on 20 mg a day. She has very little cough or mucus production, and feels that her sats with exercise have stabilized. She is getting ready to start pulmonary rehabilitation. She is also due for her monthly blood work.   Review of Systems  Constitutional: Negative for fever and unexpected weight change.  HENT: Positive for congestion and postnasal drip. Negative for dental problem, ear pain, nosebleeds, rhinorrhea, sinus pressure, sneezing, sore throat and trouble swallowing.   Eyes: Negative for redness and itching.  Respiratory: Positive for cough and shortness of breath. Negative for chest tightness and wheezing.   Cardiovascular: Negative for palpitations and leg swelling.  Gastrointestinal: Negative for nausea and vomiting.  Genitourinary: Negative for dysuria.  Musculoskeletal: Negative for joint swelling.  Skin: Negative for rash.  Neurological: Negative for headaches.  Hematological: Does not bruise/bleed easily.  Psychiatric/Behavioral: Negative for dysphoric mood. The patient is not nervous/anxious.        Objective:   Physical Exam Well-developed female in no acute distress Nose without purulence or discharge noted Neck without lymphadenopathy or thyromegaly Chest with basilar crackles, no wheezes Cardiac exam with regular rate and rhythm Lower extremities without edema, no cyanosis Alert and oriented, moves all 4 extremities.       Assessment &  Plan:

## 2014-11-18 NOTE — Assessment & Plan Note (Signed)
The patient is doing much better since increasing her prednisone dose, and we will now work on decreasing her to below 20 mg a day. I have asked her to decrease to 17.5 mg a day, and if doing well Go to 15 mg a day and hold. She is getting ready to start pulmonary rehabilitation, and has seen stabilization of her exercise saturations since being on prednisone. I've also reminded her that we need to do blood work on a monthly basis while she is on CellCept.

## 2014-11-19 LAB — CBC WITH DIFFERENTIAL
BASOS ABS: 0.1 10*3/uL (ref 0.0–0.2)
Basos: 0 %
EOS: 1 %
Eosinophils Absolute: 0.2 10*3/uL (ref 0.0–0.4)
HEMATOCRIT: 48.5 % — AB (ref 34.0–46.6)
Hemoglobin: 16 g/dL — ABNORMAL HIGH (ref 11.1–15.9)
IMMATURE GRANS (ABS): 0 10*3/uL (ref 0.0–0.1)
IMMATURE GRANULOCYTES: 0 %
LYMPHS ABS: 1.6 10*3/uL (ref 0.7–3.1)
LYMPHS: 12 %
MCH: 28.5 pg (ref 26.6–33.0)
MCHC: 33 g/dL (ref 31.5–35.7)
MCV: 86 fL (ref 79–97)
MONOS ABS: 1 10*3/uL — AB (ref 0.1–0.9)
Monocytes: 7 %
Neutrophils Absolute: 10.8 10*3/uL — ABNORMAL HIGH (ref 1.4–7.0)
Neutrophils Relative %: 80 %
PLATELETS: 194 10*3/uL (ref 150–379)
RBC: 5.62 x10E6/uL — ABNORMAL HIGH (ref 3.77–5.28)
RDW: 14.9 % (ref 12.3–15.4)
WBC: 13.6 10*3/uL — ABNORMAL HIGH (ref 3.4–10.8)

## 2014-11-30 ENCOUNTER — Encounter (HOSPITAL_COMMUNITY)
Admission: RE | Admit: 2014-11-30 | Discharge: 2014-11-30 | Disposition: A | Discharge status: Home / Self Care | Payer: Medicare Other | Source: Ambulatory Visit | Attending: Pulmonary Disease | Admitting: Pulmonary Disease

## 2014-11-30 VITALS — BP 141/86 | HR 89

## 2014-11-30 DIAGNOSIS — J849 Interstitial pulmonary disease, unspecified: Secondary | ICD-10-CM | POA: Diagnosis not present

## 2014-11-30 DIAGNOSIS — Z9981 Dependence on supplemental oxygen: Secondary | ICD-10-CM | POA: Diagnosis not present

## 2014-11-30 DIAGNOSIS — J8410 Pulmonary fibrosis, unspecified: Secondary | ICD-10-CM

## 2014-11-30 NOTE — Progress Notes (Signed)
[name] [name] 69 y.o. female Pulmonary Rehab Orientation Note Patient arrived today in Cardiac and Pulmonary Rehab for orientation to Pulmonary Rehab. She was transported from [employer] via wheel chair. She does carry portable oxygen. Per pt, she uses oxygen frequently. Color good, skin warm and dry. Patient is oriented to time and place. Patient's medical history and medications reviewed. Heart rate is normal, breath sounds clear to auscultation, no wheezes, rales, or rhonchi with crackles in the bases. Grip strength equal, strong. Distal pulses 2+ bilateral pedal pulses present. Patient reports she does take medications as prescribed. Patient states she follows a Regular diet. The patient reports no specific efforts to gain or lose weight.  Patient's weight will be monitored closely. Demonstration and practice of PLB using pulse oximeter. Patient able to return demonstration satisfactorily. Safety and hand hygiene in the exercise area reviewed with patient. Patient voices understanding of the information reviewed. Department expectations discussed with patient and achievable goals were set. Discussed the disease process of pulmonary fibrosis with patient and gave her two phamplets on pulmonary fibrosis.  The patient shows enthusiasm about attending the program and we look forward to working with this nice lady. The patient is scheduled for a 6 min walk test on Tuesday, December 01, 2014 @ 3:45 pm and to begin exercise on Thursday, December 03, 2014 in the 10:30 am class.  [phone]

## 2014-12-01 ENCOUNTER — Telehealth: Payer: Self-pay | Admitting: Internal Medicine

## 2014-12-01 ENCOUNTER — Encounter (HOSPITAL_COMMUNITY)
Admission: RE | Admit: 2014-12-01 | Discharge: 2014-12-01 | Disposition: A | Discharge status: Home / Self Care | Payer: Medicare Other | Source: Ambulatory Visit | Attending: Pulmonary Disease | Admitting: Pulmonary Disease

## 2014-12-01 DIAGNOSIS — J849 Interstitial pulmonary disease, unspecified: Secondary | ICD-10-CM | POA: Diagnosis not present

## 2014-12-01 NOTE — Progress Notes (Signed)
[name] [name] completed a Six-Minute Walk Test on 12/01/14 . [name] [name] walked 1,029 feet with 0 breaks.  The patient's lowest oxygen saturation was 85% , highest heart rate was 112 , and highest blood pressure was 148/50. The patient started off on 4 liters of oxygen with a nasal cannula but was increased to 6 liters on the second minute due to a desaturationof 85%.  On the 4th minute was increased to 8 liters due to a saturation of 88%.  8 liters increased her to 91% oxygen saturation . [name] [name] stated that nothing hindered her walk test.

## 2014-12-01 NOTE — Telephone Encounter (Signed)
pulm rehab is now closed. WCB in AM

## 2014-12-02 NOTE — Telephone Encounter (Signed)
lmtcb for Molly.  

## 2014-12-03 ENCOUNTER — Encounter (HOSPITAL_COMMUNITY)
Admission: RE | Admit: 2014-12-03 | Discharge: 2014-12-03 | Disposition: A | Discharge status: Home / Self Care | Payer: Medicare Other | Source: Ambulatory Visit | Attending: Pulmonary Disease | Admitting: Pulmonary Disease

## 2014-12-03 DIAGNOSIS — J849 Interstitial pulmonary disease, unspecified: Secondary | ICD-10-CM | POA: Diagnosis not present

## 2014-12-03 NOTE — Telephone Encounter (Signed)
lmtcb for Molly.  

## 2014-12-03 NOTE — Progress Notes (Signed)
Today, [name] [name] exercised at [employer] [name]. Service time was from 1030 to 1215.  The patient exercised for more than 31 minutes performing aerobic, strengthening, and stretching exercises. Oxygen saturation, heart rate, blood pressure, rate of perceived exertion, and shortness of breath were all monitored before, during, and after exercise. [name] [name] presented with no problems at today's exercise session. [name] [name] also attended a group education session with the respiratory therapist.  There was no workload change during today's exercise session.  Pre-exercise vitals: . Weight kg: 53.2 . Liters of O2: 2L . SpO2: 89 increased to 92 with PLB . HR: 94 . BP: 118/44 . CBG: na  Exercise vitals: . Highest heartrate:  115 . Lowest oxygen saturation: 83 increased to 97 with increased in O2 and rest break . Highest blood pressure: 172/58 . Liters of 02: 8L  Post-exercise vitals: . SpO2: 94 . HR: 82 . BP: 128/64 . Liters of O2: 2L . CBG: na  Dr. Brand Males, Medical Director Dr. Wyline Copas is immediately available during today's Pulmonary Rehab session for [name] [name] on 12/03/2014 at 1030 class time.

## 2014-12-03 NOTE — Telephone Encounter (Signed)
Spoke with [facility], aware of rec's per [facility].  States that the patient was tested today with 6-8L/min on oximizer and was unable to maintain above 90% on 6L/min. Pt was 83% on 6L/min with sitting - unable to get O2 sats above 90.  Pt was then placed on 8L/min with oximizer and sats were maintained above 90% with sitting and ambulation. Pt was encouraged to take multiple rest breaks to keep O2 levels above 90% with ambulation. [name] [name] is wanting to check with Dr Gwenette Greet that he is okay with patient being D/C home with this liter flow and oximizer.   Please advise. Thanks.

## 2014-12-03 NOTE — Telephone Encounter (Signed)
Called and spoke to [facility] as pt is at rehab. Informed Molly of the recs per [facility]. Cloyde Reams stated pt is planning on calling next week and wanting to speak directly with [facility] regarding transplant. Cloyde Reams stated she will inform pt of the recs and pt will call back next week with an update. Will await call back.

## 2014-12-03 NOTE — Telephone Encounter (Signed)
This is not pratical at home.  If she is talking about only at pulmonary rehab, then ok with me.

## 2014-12-03 NOTE — Telephone Encounter (Signed)
[name] [name] returned call. [name] [name] stated while performing 6MWT pt desat on 4lpm to 85%, O2 was increase to 6lpm and pt desat to 88%, O2 was then increased again to 8lpm and pt maintained just above 90%. When pt completed walk her O2 on 8lpm desat to 86% but quickly recovered to above 90%. [name] [name] is requesting the approval from [facility] to have pt on 6lpm with seated activities and increase to 8lpm with any ambulatory activities. Pt stated she will bring oximizer today to rehab appt.   Will forward to [facility] for approval.

## 2014-12-03 NOTE — Telephone Encounter (Signed)
There is no way she is going to maintain sats above 90% with exertion at home.  Not pratical, unless she wants to stack concentrators at home (2 of them).  I would like for her to check her sats AT HOME on her usual flow AT REST, and let us know what it is running.  If she doesn't want to stack concentrators at home for exertion, or switch to very large tanks back and forth, would be ok with her sats above 80% at home with activity.  See if the pt is ok with this, or I can call her if not.

## 2014-12-08 ENCOUNTER — Telehealth: Payer: Self-pay | Admitting: Pulmonary Disease

## 2014-12-08 ENCOUNTER — Encounter (HOSPITAL_COMMUNITY)
Admission: RE | Admit: 2014-12-08 | Discharge: 2014-12-08 | Disposition: A | Discharge status: Home / Self Care | Payer: Medicare Other | Source: Ambulatory Visit | Attending: Pulmonary Disease | Admitting: Pulmonary Disease

## 2014-12-08 DIAGNOSIS — J849 Interstitial pulmonary disease, unspecified: Secondary | ICD-10-CM | POA: Diagnosis not present

## 2014-12-08 NOTE — Telephone Encounter (Signed)
I don't see a referral to [facility] for lung transplant.  Please advise [place] if this needs to be done? thanks

## 2014-12-08 NOTE — Progress Notes (Signed)
Today, Rubena exercised at [employer] [facility]. Service time was from 10:30a to 12:10p.  The patient exercised for more than 31 minutes performing aerobic, strengthening, and stretching exercises. Oxygen saturation, heart rate, blood pressure, rate of perceived exertion, and shortness of breath were all monitored before, during, and after exercise. Jacqulyne presented with no problems at today's exercise session.   There was no workload change during today's exercise session.  Pre-exercise vitals: . Weight kg: 53.0 . Liters of O2: 1 . SpO2: 94 . HR: 106 . BP: 124/70 . CBG: na  Exercise vitals: . Highest heartrate:  124 . Lowest oxygen saturation: 87% . Highest blood pressure: 150/48 . Liters of 02: 8  Post-exercise vitals: . SpO2: 99 . HR: 105 . BP: 124/60 . Liters of O2: 2 . CBG: na  Dr. Brand Males, Medical Director Dr. Wyline Copas is immediately available during today's Pulmonary Rehab session for [name] [name] on 12/08/14 at 10:30a class time.

## 2014-12-09 ENCOUNTER — Other Ambulatory Visit: Payer: Self-pay | Admitting: Pulmonary Disease

## 2014-12-09 DIAGNOSIS — J849 Interstitial pulmonary disease, unspecified: Secondary | ICD-10-CM

## 2014-12-09 NOTE — Telephone Encounter (Signed)
Spoke with the pt and notified that the referral has been sent  She verbalized understanding and nothing further needed

## 2014-12-09 NOTE — Telephone Encounter (Signed)
This has been sent

## 2014-12-09 NOTE — Telephone Encounter (Signed)
lmtcb for pt x1

## 2014-12-10 ENCOUNTER — Encounter (HOSPITAL_COMMUNITY)
Admission: RE | Admit: 2014-12-10 | Discharge: 2014-12-10 | Disposition: A | Discharge status: Home / Self Care | Payer: Medicare Other | Source: Ambulatory Visit | Attending: Pulmonary Disease | Admitting: Pulmonary Disease

## 2014-12-10 DIAGNOSIS — J849 Interstitial pulmonary disease, unspecified: Secondary | ICD-10-CM | POA: Diagnosis not present

## 2014-12-10 NOTE — Progress Notes (Signed)
Today, Choua exercised at [employer]. Service time was from 1030 to 1230.  The patient exercised for more than 31 minutes performing aerobic, strengthening, and stretching exercises. Oxygen saturation, heart rate, blood pressure, rate of perceived exertion, and shortness of breath were all monitored before, during, and after exercise. Marce presented with no problems at today's exercise session. Patient attended the [facility] inservice today.  There was no workload change during today's exercise session.  Pre-exercise vitals: . Weight kg: 53.3 . Liters of O2: 1 . SpO2: 94 . HR: 90 . BP: 124/52 . CBG: na  Exercise vitals: . Highest heartrate:  114 . Lowest oxygen saturation: 88 which increased with rest to95 . Highest blood pressure: 130/52 . Liters of 02: 10  Post-exercise vitals: . SpO2: 98 . HR: 95 . BP: 120/60 . Liters of O2: 4 . CBG: na Dr. Brand Males, Medical Director Dr. Candiss Norse is immediately available during today's Pulmonary Rehab session for Thania T Waters on 12/10/2014 at 1030 class time.  Marland Kitchen

## 2014-12-10 NOTE — Telephone Encounter (Signed)
Spoke with pt - discussed below with pt.  Pt states o2 sat is currently 96% on 2lpm on concentrator while at rest.  States o2 sat at rest usually runs in the mid 90s while on 2lpm or less via [place].  With exertion it falls into the 80s but quickly recovers when pt stops to rest.  She also takes frequent rest breaks while doing activities.  Pt is ok with keeping sats above 80s while at home with current o2 and prefers not to stack concentrators or use larger tanks at this time.  Pt voices no further questions or concerns at this time and is aware I will send to [facility] as FYI.

## 2014-12-15 ENCOUNTER — Encounter (HOSPITAL_COMMUNITY)
Admission: RE | Admit: 2014-12-15 | Discharge: 2014-12-15 | Disposition: A | Discharge status: Home / Self Care | Payer: Medicare Other | Source: Ambulatory Visit | Attending: Pulmonary Disease | Admitting: Pulmonary Disease

## 2014-12-15 ENCOUNTER — Other Ambulatory Visit: Payer: Self-pay | Admitting: Pulmonary Disease

## 2014-12-15 DIAGNOSIS — J849 Interstitial pulmonary disease, unspecified: Secondary | ICD-10-CM | POA: Diagnosis not present

## 2014-12-15 NOTE — Progress Notes (Signed)
Today, Rakiya exercised at [employer] [facility]. Service time was from 10:30a to 12:25p.  The patient exercised for more than 31 minutes performing aerobic, strengthening, and stretching exercises. Oxygen saturation, heart rate, blood pressure, rate of perceived exertion, and shortness of breath were all monitored before, during, and after exercise. Maelin presented with no problems at today's exercise session.   There was an increase in workload change during today's exercise session.  Pre-exercise vitals: . Weight kg: 53.1 . Liters of O2: 1 . SpO2: 94 . HR: 93 . BP: 120/60 . CBG: na  Exercise vitals: . Highest heartrate:  122 . Lowest oxygen saturation: 87% . Highest blood pressure: 150/68 . Liters of 02: 10 L  Post-exercise vitals: . SpO2: 99 . HR: 102 . BP: 118/60 . Liters of O2: 2 . CBG: na  Dr. Brand Males, Medical Director Dr. Candiss Norse is immediately available during today's Pulmonary Rehab session for [name] [name] on 12/15/14 at 10:30am class time.

## 2014-12-17 ENCOUNTER — Encounter (HOSPITAL_COMMUNITY)
Admission: RE | Admit: 2014-12-17 | Discharge: 2014-12-17 | Disposition: A | Discharge status: Home / Self Care | Payer: Medicare Other | Source: Ambulatory Visit | Attending: Pulmonary Disease | Admitting: Pulmonary Disease

## 2014-12-17 DIAGNOSIS — J849 Interstitial pulmonary disease, unspecified: Secondary | ICD-10-CM | POA: Diagnosis not present

## 2014-12-17 NOTE — Progress Notes (Signed)
Today, [name] [name] exercised at [employer] [name]. Service [name] was from 1330 to 1515.  The patient exercised for more than 31 minutes performing aerobic, strengthening, and stretching exercises. Oxygen saturation, heart rate, blood pressure, rate of perceived exertion, and shortness of breath were all monitored before, during, and after exercise. [name] [name] presented with no problems at today's exercise session.   There was no workload change during today's exercise session.  Pre-exercise vitals: . Weight kg: 52.9 . Liters of O2: 1 . SpO2: 95 . HR: 108 . BP: 128/66 . CBG: na  Exercise vitals: . Highest heartrate:  123 . Lowest oxygen saturation: 87 which increased to 90 . Highest blood pressure: 144/50 . Liters of 02: 8  Post-exercise vitals: . SpO2: 100 . HR: 93 . BP: 116/64 . Liters of O2: 2 . CBG: na Dr. Brand Males, Medical Director Dr. Candiss Norse is immediately available during today's Pulmonary Rehab session for [name] [name] on 12/17/2014 at 1330 class [name].  Marland Kitchen

## 2014-12-21 ENCOUNTER — Other Ambulatory Visit: Payer: Self-pay | Admitting: Pulmonary Disease

## 2014-12-21 ENCOUNTER — Other Ambulatory Visit (INDEPENDENT_AMBULATORY_CARE_PROVIDER_SITE_OTHER): Payer: Medicare Other

## 2014-12-21 DIAGNOSIS — J849 Interstitial pulmonary disease, unspecified: Secondary | ICD-10-CM

## 2014-12-21 LAB — CBC WITH DIFFERENTIAL/PLATELET
BASOS PCT: 0.3 % (ref 0.0–3.0)
Basophils Absolute: 0 10*3/uL (ref 0.0–0.1)
Eosinophils Absolute: 0 10*3/uL (ref 0.0–0.7)
Eosinophils Relative: 0.3 % (ref 0.0–5.0)
HEMATOCRIT: 47.2 % — AB (ref 36.0–46.0)
Hemoglobin: 15.6 g/dL — ABNORMAL HIGH (ref 12.0–15.0)
Lymphocytes Relative: 3.9 % — ABNORMAL LOW (ref 12.0–46.0)
Lymphs Abs: 0.5 10*3/uL — ABNORMAL LOW (ref 0.7–4.0)
MCHC: 33 g/dL (ref 30.0–36.0)
MCV: 84.9 fl (ref 78.0–100.0)
MONO ABS: 0.2 10*3/uL (ref 0.1–1.0)
Monocytes Relative: 1.7 % — ABNORMAL LOW (ref 3.0–12.0)
Neutro Abs: 12.9 10*3/uL — ABNORMAL HIGH (ref 1.4–7.7)
Neutrophils Relative %: 93.8 % — ABNORMAL HIGH (ref 43.0–77.0)
Platelets: 228 10*3/uL (ref 150.0–400.0)
RBC: 5.56 Mil/uL — ABNORMAL HIGH (ref 3.87–5.11)
RDW: 15.5 % (ref 11.5–15.5)
WBC: 13.7 10*3/uL — AB (ref 4.0–10.5)

## 2014-12-22 ENCOUNTER — Encounter (HOSPITAL_COMMUNITY)
Admission: RE | Admit: 2014-12-22 | Discharge: 2014-12-22 | Disposition: A | Discharge status: Home / Self Care | Payer: Medicare Other | Source: Ambulatory Visit | Attending: Pulmonary Disease | Admitting: Pulmonary Disease

## 2014-12-22 DIAGNOSIS — J849 Interstitial pulmonary disease, unspecified: Secondary | ICD-10-CM | POA: Diagnosis not present

## 2014-12-22 LAB — BASIC METABOLIC PANEL
BUN: 8 mg/dL (ref 6–23)
CO2: 25 mEq/L (ref 19–32)
Calcium: 9.6 mg/dL (ref 8.4–10.5)
Chloride: 103 mEq/L (ref 96–112)
Creatinine, Ser: 0.78 mg/dL (ref 0.40–1.20)
GFR: 77.95 mL/min (ref >60.00)
Glucose, Bld: 114 mg/dL — ABNORMAL HIGH (ref 70–99)
POTASSIUM: 4.3 meq/L (ref 3.5–5.1)
SODIUM: 139 meq/L (ref 135–145)

## 2014-12-22 LAB — HEPATIC FUNCTION PANEL
ALK PHOS: 65 U/L (ref 39–117)
ALT: 21 U/L (ref 0–35)
AST: 20 U/L (ref 0–37)
Albumin: 4.2 g/dL (ref 3.5–5.2)
Bilirubin, Direct: 0.1 mg/dL (ref 0.0–0.3)
Total Bilirubin: 0.5 mg/dL (ref 0.2–1.2)
Total Protein: 7 g/dL (ref 6.0–8.3)

## 2014-12-22 NOTE — Progress Notes (Signed)
I have reviewed a Home Exercise Prescription with Cristie Hem . Mirenda is not currently exercising at home.  The patient was advised to walk 2-3 days a week for 30 minutes.  Alma Friendly and I discussed how to progress their exercise prescription.  The patient stated that their goals were to get a lung transplant and get off the oxygen.  The patient stated that they understand the exercise prescription.  We reviewed exercise guidelines, target heart rate during exercise, oxygen use, weather, home pulse oximeter, endpoints for exercise, and goals.  Patient is encouraged to come to me with any questions. I will continue to follow up with the patient to assist them with progression and safety.

## 2014-12-22 NOTE — Progress Notes (Signed)
Today, [name] [name] exercised at [employer] [facility]. Service time was from 10:30am to 12:05pm.  The patient exercised for more than 31 minutes performing aerobic, strengthening, and stretching exercises. Oxygen saturation, heart rate, blood pressure, rate of perceived exertion, and shortness of breath were all monitored before, during, and after exercise. Harvest presented with no problems at today's exercise session.   There was no workload change during today's exercise session.  Pre-exercise vitals: . Weight kg: 53.2 . Liters of O2: 1 . SpO2: 99 . HR: 94 . BP: 124/64 . CBG: na  Exercise vitals: . Highest heartrate:  130 . Lowest oxygen saturation: 87% . Highest blood pressure: 112/60 . Liters of 02: 8 and 10liters  Post-exercise vitals: . SpO2: 99 . HR: 104 . BP: 100/48 . Liters of O2: 2 . CBG: na  Dr. Brand Males, Medical Director Dr. Coralyn Pear is immediately available during today's Pulmonary Rehab session for [name] [name] on 12/22/14 at 10:30am class time.

## 2014-12-24 ENCOUNTER — Encounter (HOSPITAL_COMMUNITY)
Admission: RE | Admit: 2014-12-24 | Discharge: 2014-12-24 | Disposition: A | Discharge status: Home / Self Care | Payer: Medicare Other | Source: Ambulatory Visit | Attending: Pulmonary Disease | Admitting: Pulmonary Disease

## 2014-12-24 DIAGNOSIS — J849 Interstitial pulmonary disease, unspecified: Secondary | ICD-10-CM | POA: Diagnosis not present

## 2014-12-24 NOTE — Progress Notes (Signed)
Today, [name] [name] exercised at [facility]. Service time was from 10:30am to 12:25pm.  The patient exercised for more than 31 minutes performing aerobic, strengthening, and stretching exercises. Oxygen saturation, heart rate, blood pressure, rate of perceived exertion, and shortness of breath were all monitored before, during, and after exercise. [name] [name] presented with no problems at today's exercise session.  The patient attended education today with Rosebud Poles on "Signs and Symptoms."  There was an increase in workload change during today's exercise session.  Pre-exercise vitals: . Weight kg: 52.8 . Liters of O2: 1 . SpO2: 95 . HR: 94 . BP: 130/50 . CBG: na  Exercise vitals: . Highest heartrate:  106 . Lowest oxygen saturation: 87% . Highest blood pressure: 104/56 . Liters of 02: 10  Post-exercise vitals: . SpO2: 98 . HR: 93 . BP: 104/54 . Liters of O2: 2 . CBG: na  Dr. Brand Males, Medical Director Dr. Candiss Norse is immediately available during today's Pulmonary Rehab session for [name] [name] on 12/24/14 at 10:30am class time.

## 2014-12-28 ENCOUNTER — Telehealth: Payer: Self-pay | Admitting: Pulmonary Disease

## 2014-12-28 NOTE — Telephone Encounter (Signed)
This does not make a lot of sense.  Any tank that she can actually drag behind her around the neighborhood with be gone in a very short period of time at 10 liters.  I think she needs to stick to her exercise program at home with her concentrator, and at pulmonary rehab where she has an unlimited supply of oxygen

## 2014-12-28 NOTE — Telephone Encounter (Signed)
Called and spoke to [facility] at pulmonary rehab. Cloyde Reams stated the pt is currently at 10lpm with activity and maintaining sats in high 80's. Pt still using 1-2lpm at rest and sats are in mid 90's. Cloyde Reams is requesting the pt have a tank that she is able to exercise with, i.e. walk around her neighborhood. Cloyde Reams stated because pt is considering transplant it is important for her to exercise. I mentioned to [facility] the 2/2 phone note conversation but Cloyde Reams is now just requesting a portable system the pt can use while exercising out of the home with-tanks she can wheel around.   [place] please advise.

## 2014-12-29 ENCOUNTER — Encounter (HOSPITAL_COMMUNITY)
Admission: RE | Admit: 2014-12-29 | Discharge: 2014-12-29 | Disposition: A | Discharge status: Home / Self Care | Payer: Medicare Other | Source: Ambulatory Visit | Attending: Pulmonary Disease | Admitting: Pulmonary Disease

## 2014-12-29 DIAGNOSIS — Z9981 Dependence on supplemental oxygen: Secondary | ICD-10-CM | POA: Diagnosis not present

## 2014-12-29 DIAGNOSIS — J849 Interstitial pulmonary disease, unspecified: Secondary | ICD-10-CM | POA: Diagnosis not present

## 2014-12-29 NOTE — Telephone Encounter (Signed)
Pt is aware of KC's recommendations. Nothing further was needed.

## 2014-12-29 NOTE — Progress Notes (Signed)
Today, Adalae exercised at [facility]. Service time was from 1030 to 1215.  The patient exercised for more than 31 minutes performing aerobic, strengthening, and stretching exercises. Oxygen saturation, heart rate, blood pressure, rate of perceived exertion, and shortness of breath were all monitored before, during, and after exercise. [name] presented with no problems at today's exercise session.   There was a workload change during today's exercise session.  Pre-exercise vitals: . Weight kg: 53.0 . Liters of O2: 1L . SpO2: 95 . HR: 94 . BP: 112/48 . CBG: na  Exercise vitals: . Highest heartrate:  128 . Lowest oxygen saturation: 83 increased to 96 with rest break and PLB . Highest blood pressure: 122/40 . Liters of 02: 10L  Post-exercise vitals: . SpO2: 95 . HR: 98 . BP: 112/56 . Liters of O2: 3L . CBG: na  Dr. Brand Males, Medical Director Dr. Candiss Norse is immediately available during today's Pulmonary Rehab session for [name] [name] on 12/29/2014 at 1030 class time.

## 2014-12-31 ENCOUNTER — Encounter (HOSPITAL_COMMUNITY)
Admission: RE | Admit: 2014-12-31 | Discharge: 2014-12-31 | Disposition: A | Discharge status: Home / Self Care | Payer: Medicare Other | Source: Ambulatory Visit | Attending: Pulmonary Disease | Admitting: Pulmonary Disease

## 2014-12-31 DIAGNOSIS — J849 Interstitial pulmonary disease, unspecified: Secondary | ICD-10-CM | POA: Diagnosis not present

## 2014-12-31 NOTE — Progress Notes (Signed)
Today, [name] [name] exercised at [facility]. Service time was from 10:30am to 12:40pm.  The patient exercised for more than 31 minutes performing aerobic, strengthening, and stretching exercises. Oxygen saturation, heart rate, blood pressure, rate of perceived exertion, and shortness of breath were all monitored before, during, and after exercise. [name] [name] presented with no problems at today's exercise session. The patient attended education today with Trish Fountain on Anatomy, Physiology and Intimacy  There was no workload change during today's exercise session.  Pre-exercise vitals: . Weight kg: 53.4 . Liters of O2: 1 . SpO2: 99 . HR: 95 . BP: 126/40 . CBG: na  Exercise vitals: . Highest heartrate:  117 . Lowest oxygen saturation: 90 . Highest blood pressure: 142/82 . Liters of 02: 10  Post-exercise vitals: . SpO2: 94 . HR: 96 . BP: 122/70 . Liters of O2: 2 . CBG: na  Dr. Brand Males, Medical Director Dr. Waldron Labs is immediately available during today's Pulmonary Rehab session for [name] [name] on 12/31/14 at 10:30am class time.

## 2015-01-05 ENCOUNTER — Encounter (HOSPITAL_COMMUNITY)
Admission: RE | Admit: 2015-01-05 | Discharge: 2015-01-05 | Disposition: A | Discharge status: Home / Self Care | Payer: Medicare Other | Source: Ambulatory Visit | Attending: Pulmonary Disease | Admitting: Pulmonary Disease

## 2015-01-05 DIAGNOSIS — J849 Interstitial pulmonary disease, unspecified: Secondary | ICD-10-CM | POA: Diagnosis not present

## 2015-01-05 NOTE — Progress Notes (Signed)
Today, Dimitri exercised at [facility]. Service time was from 1030 to 1210.  The patient exercised for more than 31 minutes performing aerobic, strengthening, and stretching exercises. Oxygen saturation, heart rate, blood pressure, rate of perceived exertion, and shortness of breath were all monitored before, during, and after exercise. Cariah presented with no problems at today's exercise session.   There was one workload change during today's exercise session.  Pre-exercise vitals: . Weight kg: 52.7 . Liters of O2: 2 . SpO2: 92 . HR: 88 . BP: 110/56 . CBG: na  Exercise vitals: . Highest heartrate:  125 . Lowest oxygen saturation: 86 which increased to 90% with rest . Highest blood pressure: 122/50 . Liters of 02: 10  Post-exercise vitals: . SpO2: 98 . HR: 97 . BP: 106/50 . Liters of O2: 4 . CBG: na Dr. Brand Males, Medical Director Dr. Waldron Labs is immediately available during today's Pulmonary Rehab session for FARRIS BLASH on 01/05/2015 at 1030 class time.  Marland Kitchen

## 2015-01-07 ENCOUNTER — Encounter (HOSPITAL_COMMUNITY)
Admission: RE | Admit: 2015-01-07 | Discharge: 2015-01-07 | Disposition: A | Discharge status: Home / Self Care | Payer: Medicare Other | Source: Ambulatory Visit | Attending: Pulmonary Disease | Admitting: Pulmonary Disease

## 2015-01-07 DIAGNOSIS — J849 Interstitial pulmonary disease, unspecified: Secondary | ICD-10-CM | POA: Diagnosis not present

## 2015-01-07 NOTE — Progress Notes (Signed)
Today, Rainah exercised at [facility]. Service time was from 10:30am to 12:15pm.  The patient exercised for more than 31 minutes performing aerobic, strengthening, and stretching exercises. Oxygen saturation, heart rate, blood pressure, rate of perceived exertion, and shortness of breath were all monitored before, during, and after exercise. [name] [name] presented with no problems at today's exercise session.  The patient attended education class today with the pharmacist on Pulmonary Medicines.   There was an increase in workload change during today's exercise session.  Pre-exercise vitals: . Weight kg: 52.9 . Liters of O2: 1 . SpO2: 98 . HR: 95 . BP: 126/60 . CBG: na  Exercise vitals: . Highest heartrate:  115 . Lowest oxygen saturation: 87% . Highest blood pressure: 148/44 . Liters of 02: 10  Post-exercise vitals: . SpO2: 93 . HR: 94 . BP: 101/86 . Liters of O2: 4 . CBG: na  Dr. Brand Males, Medical Director Dr. Waldron Labs is immediately available during today's Pulmonary Rehab session for JOREEN SWEARINGIN on 01/07/15 at 10:30am class time.

## 2015-01-11 ENCOUNTER — Telehealth: Payer: Self-pay | Admitting: Pulmonary Disease

## 2015-01-11 DIAGNOSIS — J849 Interstitial pulmonary disease, unspecified: Secondary | ICD-10-CM

## 2015-01-11 NOTE — Telephone Encounter (Signed)
Spoke with portia at pulmonary rehab, states that pt has been consistently desaturating on 10L with rehab, ranging down to 82%.  States she is not progressing with her activity because of how low her 02 drops.  Wants to know if it is ok to bump 02 up to 12-15lpm with exertion, or if she is ok to continue exercising at 10lpm with her sats dropping into the 80's, and if she needs other 02 system in her home.  States that pt cannot exercise at home because she desats so low.    Aware that [facility] is out until Wednesday and is ok with waiting on recs until his return.    [place] please advise. Thanks!

## 2015-01-12 ENCOUNTER — Encounter (HOSPITAL_COMMUNITY): Payer: Self-pay

## 2015-01-12 ENCOUNTER — Encounter (HOSPITAL_COMMUNITY)
Admission: RE | Admit: 2015-01-12 | Discharge: 2015-01-12 | Disposition: A | Discharge status: Home / Self Care | Payer: Medicare Other | Source: Ambulatory Visit | Attending: Pulmonary Disease | Admitting: Pulmonary Disease

## 2015-01-12 DIAGNOSIS — J849 Interstitial pulmonary disease, unspecified: Secondary | ICD-10-CM | POA: Diagnosis not present

## 2015-01-12 NOTE — Progress Notes (Signed)
Today, [name] [name] exercised at [facility]. Service time was from 10:30am to 12:30pm.  The patient exercised for more than 31 minutes performing aerobic, strengthening, and stretching exercises. Oxygen saturation, heart rate, blood pressure, rate of perceived exertion, and shortness of breath were all monitored before, during, and after exercise. [name] presented with no problems at today's exercise session.   There was no workload change during today's exercise session.  Pre-exercise vitals: . Weight kg: 53.1 . Liters of O2: 2 . SpO2: 95 . HR: 96 . BP: 132/64 . CBG: na  Exercise vitals: . Highest heartrate:  122 . Lowest oxygen saturation: 83% . Highest blood pressure: 132/48 . Liters of 02: 10  Post-exercise vitals: . SpO2: 92 . HR: 93 . BP: 102/44 . Liters of O2: 2 . CBG: na  Dr. Brand Males, Medical Director Dr. Charlies Silvers is immediately available during today's Pulmonary Rehab session for [name] [name] on 01/12/15 at 10:30am class time.

## 2015-01-13 NOTE — Telephone Encounter (Signed)
Left message for Portia at pulmonary rehab- pulm is off on Wednesday.

## 2015-01-13 NOTE — Telephone Encounter (Signed)
Let rehab know they can increase oxygen to whatever it takes to keep sats up at rehab At home, all we can do is add another concentrator so that she gets oxygen from both.  She can use both together with exercise, but suspect 12 liters is all we can get.

## 2015-01-14 ENCOUNTER — Encounter (HOSPITAL_COMMUNITY)
Admission: RE | Admit: 2015-01-14 | Discharge: 2015-01-14 | Disposition: A | Discharge status: Home / Self Care | Payer: Medicare Other | Source: Ambulatory Visit | Attending: Pulmonary Disease | Admitting: Pulmonary Disease

## 2015-01-14 DIAGNOSIS — J849 Interstitial pulmonary disease, unspecified: Secondary | ICD-10-CM | POA: Diagnosis not present

## 2015-01-14 NOTE — Telephone Encounter (Signed)
lmtcb for Portia.  

## 2015-01-14 NOTE — Progress Notes (Signed)
Today, Joaquina exercised at [facility]. Service time was from 1030 to 1245.  The patient exercised for more than 31 minutes performing aerobic, strengthening, and stretching exercises. Oxygen saturation, heart rate, blood pressure, rate of perceived exertion, and shortness of breath were all monitored before, during, and after exercise. Samyukta presented with no problems at today's exercise session. Shaune also attended an education session with RD on nutrition for the pulmonary patient.  There was no workload change during today's exercise session.  Pre-exercise vitals: . Weight kg: 55.0 . Liters of O2: 2L . SpO2: 90 . HR: 83 . BP: 118/46 . CBG: na  Exercise vitals: . Highest heartrate:  112 . Lowest oxygen saturation: 88 . Highest blood pressure: 156/48 . Liters of 02: 15L  Post-exercise vitals: . SpO2: 94 . HR: 94 . BP: 110/60 . Liters of O2: 2L . CBG: na  Dr. Brand Males, Medical Director Dr. Frederic Jericho is immediately available during today's Pulmonary Rehab session for MALEIGH BAGOT on 01/14/2015 at 1030 class time.

## 2015-01-14 NOTE — Telephone Encounter (Signed)
Mindy, please call pt and see if she really wants large tanks at home that only last 64min, and will have to be replaced every single day!!  These will have to be pulled on a cart with double tanks behind her. ??

## 2015-01-14 NOTE — Telephone Encounter (Signed)
Portia returned call. Informed Portia of the recs per [facility]. Portia verbalized understanding and is requesting the pt be supplied 2 cylinder tanks for outside of the home exercise for 30 min per day. Truddie Crumble stated one 2000 psi tank will allow 20-25 min of exercise at 15 lpm, Portia is requesting 2 tanks for pt's exercise lasting around 40 min. Truddie Crumble is also requesting the double holster for her tanks to wheel behind her. Pt is working towards transplant with [facility] and daily exercise is crucial in the process of acceptance. Truddie Crumble understands as this is an ongoing issue but is appreciative of the time and patients of [place].   [place] please advise.

## 2015-01-15 NOTE — Telephone Encounter (Signed)
lmomtcb x1 

## 2015-01-18 ENCOUNTER — Ambulatory Visit: Payer: Medicare Other | Admitting: Pulmonary Disease

## 2015-01-18 NOTE — Telephone Encounter (Signed)
LMTCB x2  

## 2015-01-19 ENCOUNTER — Encounter (HOSPITAL_COMMUNITY): Admission: RE | Admit: 2015-01-19 | Payer: Medicare Other | Source: Ambulatory Visit

## 2015-01-19 NOTE — Telephone Encounter (Signed)
lmtcb x3 

## 2015-01-19 NOTE — Telephone Encounter (Signed)
Spoke with pt. She would like to proceed with ordering large tanks for exercise.  [place] - please advise. Thanks.

## 2015-01-19 NOTE — Telephone Encounter (Signed)
Pt returned call - [phone]

## 2015-01-20 NOTE — Telephone Encounter (Signed)
Ok to order large tanks.  Whatever she is willing to deal with.

## 2015-01-20 NOTE — Telephone Encounter (Signed)
Order has been placed. Nothing further was needed. 

## 2015-01-21 ENCOUNTER — Encounter (HOSPITAL_COMMUNITY)
Admission: RE | Admit: 2015-01-21 | Discharge: 2015-01-21 | Disposition: A | Discharge status: Home / Self Care | Payer: Medicare Other | Source: Ambulatory Visit | Attending: Pulmonary Disease | Admitting: Pulmonary Disease

## 2015-01-21 DIAGNOSIS — J849 Interstitial pulmonary disease, unspecified: Secondary | ICD-10-CM | POA: Diagnosis not present

## 2015-01-21 NOTE — Progress Notes (Signed)
Today, [name] [name] exercised at [employer] [facility]. Service time was from 1030 to 1215.  The patient exercised by performing aerobic, strengthening, and stretching exercises. Oxygen saturation, heart rate, blood pressure, rate of perceived exertion, and shortness of breath were all monitored before, during, and after exercise. [name] presented with no problems at today's exercise session.  Patient attended the risk factor reduction class today.  There was no workload change today.    Pre-exercise vitals: . Weight kg: 52.3 . Liters of O2: 2 . SpO2: 98 . HR: 104 . BP: 106/54 . CBG: na  Exercise vitals: . Highest heartrate:  128 . Lowest oxygen saturation: 87 . Highest blood pressure: 150/60 . Liters of 02: 15  Post-exercise vitals: . SpO2: 97 . HR: 100 . BP: 100/50 . Liters of O2: 4 . CBG: na Dr. Brand Males, Medical Director Dr. Sheran Fava is immediately available during today's Pulmonary Rehab session for [name] [name] on 01/21/2015 at 1030 class time.  Marland Kitchen

## 2015-01-25 ENCOUNTER — Telehealth: Payer: Self-pay | Admitting: Pulmonary Disease

## 2015-01-25 NOTE — Telephone Encounter (Signed)
Called and spoke to [place] with [facility] to inform him of the liter flow for O2 with exertion, 15lpm with activity. Corene Cornea stated the pt also wants a call back to assure this is the correct liter flow.   LMTCB for pt.

## 2015-01-26 ENCOUNTER — Encounter (HOSPITAL_COMMUNITY): Payer: Medicare Other

## 2015-01-26 ENCOUNTER — Ambulatory Visit: Payer: Medicare Other | Admitting: Family

## 2015-01-26 ENCOUNTER — Other Ambulatory Visit (HOSPITAL_COMMUNITY): Payer: Medicare Other

## 2015-01-26 NOTE — Telephone Encounter (Signed)
LMTC x `1 for pt 

## 2015-01-27 NOTE — Telephone Encounter (Signed)
Spoke with Melissa, states that the patient refused the 15LPM O2 that Jerome tried to deliver today.  Called LM x4 for patient to try and figure out why she refused O2 after she was requesting her liter flow be increased.

## 2015-01-27 NOTE — Telephone Encounter (Signed)
LM for pt to return call x 3

## 2015-01-27 NOTE — Telephone Encounter (Signed)
Per Lenna Sciara pt refused her o2 that was increased liter flow  [phone]

## 2015-01-28 ENCOUNTER — Encounter (HOSPITAL_COMMUNITY): Payer: Medicare Other

## 2015-01-28 ENCOUNTER — Telehealth: Payer: Self-pay | Admitting: Pulmonary Disease

## 2015-01-28 NOTE — Telephone Encounter (Signed)
Called spoke with pt. She needed nothing further and already has large tanks in home

## 2015-01-28 NOTE — Telephone Encounter (Signed)
LMTCB and will close per triage protocol 

## 2015-01-29 ENCOUNTER — Encounter (INDEPENDENT_AMBULATORY_CARE_PROVIDER_SITE_OTHER): Payer: Self-pay

## 2015-01-29 ENCOUNTER — Encounter: Payer: Self-pay | Admitting: Pulmonary Disease

## 2015-01-29 ENCOUNTER — Ambulatory Visit (INDEPENDENT_AMBULATORY_CARE_PROVIDER_SITE_OTHER): Payer: Medicare Other | Admitting: Pulmonary Disease

## 2015-01-29 VITALS — BP 124/62 | HR 97 | Temp 98.0°F | Ht 60.0 in | Wt 114.8 lb

## 2015-01-29 DIAGNOSIS — J849 Interstitial pulmonary disease, unspecified: Secondary | ICD-10-CM

## 2015-01-29 NOTE — Progress Notes (Signed)
   Subjective:    Patient ID: [name] [name], female    DOB: August 26, 1946, 69 y.o.   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 She has maintained on CellCept since last fall, as well as low-dose prednisone. She has worked very hard in pulmonary rehabilitation and at home, and has been seen by [facility] for possible transplantation. Scheduled to have her full evaluation next week. She denies any significant dry cough, congestion, or mucus. Not had any lower extremity edema. She has had her various labs checked associated with her CellCept therapy, and they have all been adequate.   Review of Systems  Constitutional: Negative for fever and unexpected weight change.  HENT: Negative for congestion, dental problem, ear pain, nosebleeds, postnasal drip, rhinorrhea, sinus pressure, sneezing, sore throat and trouble swallowing.   Eyes: Negative for redness and itching.  Respiratory: Positive for shortness of breath. Negative for cough, chest tightness and wheezing.   Cardiovascular: Negative for palpitations and leg swelling.  Gastrointestinal: Negative for nausea and vomiting.  Genitourinary: Negative for dysuria.  Musculoskeletal: Negative for joint swelling.  Skin: Negative for rash.  Neurological: Negative for headaches.  Hematological: Does not bruise/bleed easily.  Psychiatric/Behavioral: Negative for dysphoric mood. The patient is not nervous/anxious.        Objective:   Physical Exam Well-developed female in no acute distress at rest. Nose without purulence or discharge noted Neck without lymphadenopathy or thyromegaly Chest with crackles a third of the way up bilaterally, no wheezing Cardiac exam with regular rate and rhythm Lower extremities without edema, no cyanosis Alert and oriented, moves all 4 extremities.       Assessment & Plan:

## 2015-01-29 NOTE — Patient Instructions (Signed)
No change in medications Your liver function tests will most likely be checked again by [facility] during your evaluation. Will give you a prescription for your initial hepatitis B vaccine. Make an appt to see me the second week of June if you do not have your transplant.  If you do, I would like for you to make an apptm in 32mos with Dr. Lake Bells.

## 2015-01-29 NOTE — Assessment & Plan Note (Signed)
The patient continues to have mildly progressive dyspnea on exertion despite being on immunosuppressants suppressant medications. She is working hard in rehabilitation, and even at home, but has had increasing oxygen requirements with exertional activity. She does very well at rest with very little dyspnea. She is to be seen at [facility] next week for possible transplant, and I have asked her to continue on her current regimen until then.

## 2015-01-31 ENCOUNTER — Other Ambulatory Visit: Payer: Self-pay | Admitting: Pulmonary Disease

## 2015-02-02 ENCOUNTER — Encounter (HOSPITAL_COMMUNITY)
Admission: RE | Admit: 2015-02-02 | Discharge: 2015-02-02 | Disposition: A | Discharge status: Home / Self Care | Payer: Medicare Other | Source: Ambulatory Visit | Attending: Pulmonary Disease | Admitting: Pulmonary Disease

## 2015-02-02 DIAGNOSIS — Z9981 Dependence on supplemental oxygen: Secondary | ICD-10-CM | POA: Insufficient documentation

## 2015-02-02 DIAGNOSIS — J849 Interstitial pulmonary disease, unspecified: Secondary | ICD-10-CM | POA: Insufficient documentation

## 2015-02-02 NOTE — Progress Notes (Signed)
Today, [name] exercised at [facility]. Service time was from 1030 to 1210.  The patient exercised by performing aerobic, strengthening, and stretching exercises. Oxygen saturation, heart rate, blood pressure, rate of perceived exertion, and shortness of breath were all monitored before, during, and after exercise. [name] presented with no problems at today's exercise session.  The patient did not have an increase in workload intensity during today's exercise session.  Pre-exercise vitals: . Weight kg: 52.6 . Liters of O2: 3 . SpO2: 97 . HR: 97 . BP: 126/48 . CBG: na  Exercise vitals: . Highest heartrate:  121 . Lowest oxygen saturation: 88 which increased to 91% with rest break and purse lip breathing . Highest blood pressure: 160/60 . Liters of 02: 15  Post-exercise vitals: . SpO2: 97 . HR: 104 . BP: 110/60 . Liters of O2: 4 . CBG: na Dr. Brand Males, Medical Director Dr. Clementeen Graham is immediately available during today's Pulmonary Rehab session for [name] [name] on 02/02/2015 at 1030 class time.  Marland Kitchen

## 2015-02-03 ENCOUNTER — Other Ambulatory Visit: Payer: Self-pay | Admitting: *Deleted

## 2015-02-03 MED ORDER — PREDNISONE 10 MG PO TABS: ORAL_TABLET | ORAL | Status: DC

## 2015-02-03 NOTE — Telephone Encounter (Signed)
Dr. [name] was seen on 01/29/15. There was no changes to her medication, patient is requesting refill on her Prednisone 10 mg. This was last refilled 12/15/14 for #100 to take as directed. Do you approve additional refills?

## 2015-02-04 ENCOUNTER — Encounter (HOSPITAL_COMMUNITY)
Admission: RE | Admit: 2015-02-04 | Discharge: 2015-02-04 | Disposition: A | Discharge status: Home / Self Care | Payer: Medicare Other | Source: Ambulatory Visit | Attending: Pulmonary Disease | Admitting: Pulmonary Disease

## 2015-02-04 DIAGNOSIS — J849 Interstitial pulmonary disease, unspecified: Secondary | ICD-10-CM | POA: Diagnosis not present

## 2015-02-04 NOTE — Progress Notes (Signed)
Today, [name] [name] exercised at [facility] [facility]. Service time was from 10:30am to 12:27pm.  The patient exercised by performing aerobic, strengthening, and stretching exercises. Oxygen saturation, heart rate, blood pressure, rate of perceived exertion, and shortness of breath were all monitored before, during, and after exercise. [name] [name] presented with no problems at today's exercise session. The patient attended education today with Trish Fountain on Oxygen Use and Safety.  The patient did not have an increase in workload intensity during today's exercise session.  Pre-exercise vitals: . Weight kg: 52.0 . Liters of O2: 3 . SpO2: 95 . HR: 84 . BP: 136/48 . CBG: na  Exercise vitals: . Highest heartrate:  121 . Lowest oxygen saturation: 84% . Highest blood pressure: 164/60 . Liters of 02: 15  Post-exercise vitals: . SpO2: 94 . HR: 88 . BP: 120/60 . Liters of O2: 2 . CBG: na  Dr. Brand Males, Medical Director Dr. Clementeen Graham is immediately available during today's Pulmonary Rehab session for [name] [name] on 02/04/15 at 10:30am class time.

## 2015-02-04 NOTE — Progress Notes (Signed)
Cristie Hem 69 y.o. female Nutrition Note Spoke with pt. Pt is at a normal wt. Pt has unintentionally lost 10.2 lb over the past 14 months and down 5.8 lb over the past 4 months. Per discussion, "the [facility] transplant team wants me to weigh 125 lb." Pt educated re: High Calorie, High Protein diet. Pt tries to avoid many salty foods. Pt adds salt to food "but I've cut back on how much I use." The role of sodium in lung disease reviewed with pt. Pt is on Prednisone. Per discussion, pt takes a calcium supplement without vitamin D "because my vitamin D was too high and my doctor told me not to take it." Pt expressed understanding of the information reviewed. Vitals - 1 value per visit 01/29/2015 11/30/2014 11/18/2014 10/13/2014  Weight (lb) 114.8  119.6 120.2   Vitals - 1 value per visit 09/15/2014 08/13/2014 08/03/2014 07/27/2014  Weight (lb) 123.2 122 125.8 122.2   Vitals - 1 value per visit 06/03/2014 03/03/2014 01/20/2014 12/04/2013  Weight (lb) 122.2 126.8 122 124.6    Nutrition Diagnosis ? Food-and nutrition-related knowledge deficit related to lack of exposure to information as related to diagnosis of pulmonary disease ? Unintentional wt loss related to decreased food intake as evidenced by wt loss of 4.8% in 4 months and 8.2% wt loss over the past year. ?  Nutrition Rx/Est. Daily Nutrition Needs for: ? wt gain 2000-2500 Kcal  80-100 gm protein   1500 mg or less sodium      Nutrition Intervention ? Pt's individual nutrition plan and goals reviewed with pt. ? Handouts given for High Calorie, High Protein diet and recipes ? Pt to attend the Nutrition and Lung Disease class - met; 01/14/15 ? Continual client-centered nutrition education by RD, as part of interdisciplinary care. Goal(s) 1. The pt will recognize symptoms that can interfere with adequate oral intake, such as shortness of breath, N/V, early satiety, fatigue, ability to secure and prepare food, taste and smell changes, chewing/swallowing  difficulties, and/ or pain when eating. 2. The pt will consume high-energy, high-nutrient dense beverages when necessary to compensate for decreased oral intake of solid foods. 3. Identify food quantities necessary to achieve wt gain at graduation from pulmonary rehab. Monitor and Evaluate progress toward nutrition goal with team.   Derek M[place], M.Ed, RD, LDN, CDE 02/04/2015 12:13 PM

## 2015-02-09 ENCOUNTER — Other Ambulatory Visit (HOSPITAL_COMMUNITY): Payer: 59

## 2015-02-09 ENCOUNTER — Encounter (HOSPITAL_COMMUNITY): Admission: RE | Admit: 2015-02-09 | Payer: Medicare Other | Source: Ambulatory Visit

## 2015-02-09 ENCOUNTER — Encounter (HOSPITAL_COMMUNITY): Payer: Self-pay

## 2015-02-09 ENCOUNTER — Ambulatory Visit: Payer: 59 | Admitting: Family

## 2015-02-09 ENCOUNTER — Other Ambulatory Visit (HOSPITAL_COMMUNITY): Payer: Medicare Other

## 2015-02-11 ENCOUNTER — Encounter (HOSPITAL_COMMUNITY): Payer: Medicare Other

## 2015-02-16 ENCOUNTER — Encounter (HOSPITAL_COMMUNITY)
Admission: RE | Admit: 2015-02-16 | Discharge: 2015-02-16 | Disposition: A | Discharge status: Home / Self Care | Payer: Medicare Other | Source: Ambulatory Visit | Attending: Pulmonary Disease | Admitting: Pulmonary Disease

## 2015-02-16 DIAGNOSIS — J849 Interstitial pulmonary disease, unspecified: Secondary | ICD-10-CM | POA: Diagnosis not present

## 2015-02-16 NOTE — Progress Notes (Signed)
Today, [name] exercised at [employer] [facility]. Service time was from 10:30am to 12:10pm.  The patient exercised by performing aerobic, strengthening, and stretching exercises. Oxygen saturation, heart rate, blood pressure, rate of perceived exertion, and shortness of breath were all monitored before, during, and after exercise. [name] [name] presented with no problems at today's exercise session.  The patient did have an increase in workload intensity during today's exercise session.  Pre-exercise vitals: . Weight kg: 52.0 . Liters of O2: 3 . SpO2: 97 . HR: 95 . BP: 132/50 . CBG: na  Exercise vitals: . Highest heartrate:  120 . Lowest oxygen saturation: 81% INCREASED TO 94% . Highest blood pressure: 134/54 . Liters of 02: 15  Post-exercise vitals: . SpO2: 98 . HR: 85 . BP: 104/50 . Liters of O2: 2 . CBG: NA  Dr. Brand Males, Medical Director Dr. Wyline Copas is immediately available during today's Pulmonary Rehab session for [name] [name] on 02/16/15 at 10:30AM class time.

## 2015-02-18 ENCOUNTER — Encounter (HOSPITAL_COMMUNITY)
Admission: RE | Admit: 2015-02-18 | Discharge: 2015-02-18 | Disposition: A | Discharge status: Home / Self Care | Payer: Medicare Other | Source: Ambulatory Visit | Attending: Pulmonary Disease | Admitting: Pulmonary Disease

## 2015-02-18 NOTE — Progress Notes (Signed)
Today, Kyri exercised at [facility] [facility]. Service time was from 1030 to 1230.  The patient exercised by performing aerobic, strengthening, and stretching exercises. Oxygen saturation, heart rate, blood pressure, rate of perceived exertion, and shortness of breath were all monitored before, during, and after exercise. Kamalei presented with no problems at today's exercise session. Zineb also attended an education session with [employer] [facility].  The patient did not have an increase in workload intensity during today's exercise session.  Pre-exercise vitals: . Weight kg: 52.7 . Liters of O2: 2L . SpO2: 87 increased to 93 with PLB . HR: 95 . BP: 130/30 . CBG: na  Exercise vitals: . Highest heartrate:  112 . Lowest oxygen saturation: 90 . Highest blood pressure: 136/40 . Liters of 02: 15L  Post-exercise vitals: . SpO2: 99 . HR: 92 . BP: 102/52 . Liters of O2: 2L . CBG: na  Dr. Brand Males, Medical Director Dr. Sheran Fava is immediately available during today's Pulmonary Rehab session for HOANG REICH on 02/18/2015 at 1030 class time.

## 2015-02-23 ENCOUNTER — Encounter (HOSPITAL_COMMUNITY)
Admission: RE | Admit: 2015-02-23 | Discharge: 2015-02-23 | Disposition: A | Discharge status: Home / Self Care | Payer: Medicare Other | Source: Ambulatory Visit | Attending: Pulmonary Disease | Admitting: Pulmonary Disease

## 2015-02-23 DIAGNOSIS — J849 Interstitial pulmonary disease, unspecified: Secondary | ICD-10-CM | POA: Diagnosis not present

## 2015-02-23 NOTE — Progress Notes (Signed)
Today, Latisa exercised at [employer]. Service time was from 10:30am to 12:20pm.  The patient exercised by performing aerobic, strengthening, and stretching exercises. Oxygen saturation, heart rate, blood pressure, rate of perceived exertion, and shortness of breath were all monitored before, during, and after exercise. [name] presented with no problems at today's exercise session.  The patient did have an increase in workload intensity during today's exercise session.  Pre-exercise vitals: . Weight kg: 52.1 . Liters of O2: 2 . SpO2: 98 . HR: 100 . BP: 130/50 . CBG: na  Exercise vitals: . Highest heartrate:  119 . Lowest oxygen saturation: 91 . Highest blood pressure: 142/60 . Liters of 02: 15  Post-exercise vitals: . SpO2: 96 . HR: 101 . BP: 108/52 . Liters of O2: 2 . CBG: na  Dr. Brand Males, Medical Director Dr. Sheran Fava is immediately available during today's Pulmonary Rehab session for [name] [name] on 02/23/15 at 10:30am class time.

## 2015-02-25 ENCOUNTER — Encounter (HOSPITAL_COMMUNITY)
Admission: RE | Admit: 2015-02-25 | Discharge: 2015-02-25 | Disposition: A | Discharge status: Home / Self Care | Payer: Medicare Other | Source: Ambulatory Visit | Attending: Pulmonary Disease | Admitting: Pulmonary Disease

## 2015-02-25 DIAGNOSIS — J849 Interstitial pulmonary disease, unspecified: Secondary | ICD-10-CM | POA: Diagnosis not present

## 2015-02-25 NOTE — Progress Notes (Signed)
Today, [name] [name] exercised at [employer]. Service time was from 10:30am to 12:20pm.  The patient exercised by performing aerobic, strengthening, and stretching exercises. Oxygen saturation, heart rate, blood pressure, rate of perceived exertion, and shortness of breath were all monitored before, during, and after exercise. [name] [name] presented with no problems at today's exercise session. The patient attended education today with Exercise Physiologist Feven Alderfer on Exercising at Home.  The patient did not have an increase in workload intensity during today's exercise session.  Pre-exercise vitals: . Weight kg: 52.5 . Liters of O2: 2 . SpO2: 97 . HR: 97 . BP: 110/50 . CBG: na  Exercise vitals: . Highest heartrate:  118 . Lowest oxygen saturation: 92 . Highest blood pressure: 154/54 . Liters of 02: 15  Post-exercise vitals: . SpO2: 91 . HR: 102 . BP: 112/60 . Liters of O2: 2 . CBG: na  Dr. Brand Males, Medical Director Dr. Erlinda Hong is immediately available during today's Pulmonary Rehab session for [name] [name] on 02/25/15 at 10:30am class time.

## 2015-03-02 ENCOUNTER — Encounter (HOSPITAL_COMMUNITY)
Admission: RE | Admit: 2015-03-02 | Discharge: 2015-03-02 | Disposition: A | Discharge status: Home / Self Care | Payer: Medicare Other | Source: Ambulatory Visit | Attending: Pulmonary Disease | Admitting: Pulmonary Disease

## 2015-03-02 DIAGNOSIS — Z9981 Dependence on supplemental oxygen: Secondary | ICD-10-CM | POA: Insufficient documentation

## 2015-03-02 DIAGNOSIS — J849 Interstitial pulmonary disease, unspecified: Secondary | ICD-10-CM | POA: Diagnosis not present

## 2015-03-02 NOTE — Progress Notes (Signed)
Today, Jailynne exercised at [employer]. Service time was from 1030 to 1220.  The patient exercised by performing aerobic, strengthening, and stretching exercises. Oxygen saturation, heart rate, blood pressure, rate of perceived exertion, and shortness of breath were all monitored before, during, and after exercise. Brylei presented with no problems at today's exercise session.  The patient did not have an increase in workload intensity during today's exercise session.  Pre-exercise vitals: . Weight kg: 52.5 . Liters of O2: 4 . SpO2: 96 . HR: 98 . BP: 114/46 . CBG: na  Exercise vitals: . Highest heartrate:  120 . Lowest oxygen saturation: 90 . Highest blood pressure: 148/50 . Liters of 02: 15  Post-exercise vitals: . SpO2: 94 . HR: 97 . BP: 120/36 . Liters of O2: 2 . CBG: na Dr. Brand Males, Medical Director Dr. Algis Liming is immediately available during today's Pulmonary Rehab session for SHINE SCROGHAM on 03/02/2015 at 1030 class time.  Marland Kitchen

## 2015-03-04 ENCOUNTER — Encounter (HOSPITAL_COMMUNITY): Payer: Medicare Other

## 2015-03-09 ENCOUNTER — Encounter (HOSPITAL_COMMUNITY)
Admission: RE | Admit: 2015-03-09 | Discharge: 2015-03-09 | Disposition: A | Discharge status: Home / Self Care | Payer: Medicare Other | Source: Ambulatory Visit | Attending: Pulmonary Disease | Admitting: Pulmonary Disease

## 2015-03-09 DIAGNOSIS — J849 Interstitial pulmonary disease, unspecified: Secondary | ICD-10-CM | POA: Diagnosis not present

## 2015-03-09 NOTE — Progress Notes (Signed)
Today, [name] [name] exercised at [employer]. Service time was from 1030 to 1230.  The patient exercised by performing aerobic, strengthening, and stretching exercises. Oxygen saturation, heart rate, blood pressure, rate of perceived exertion, and shortness of breath were all monitored before, during, and after exercise. Alissa presented with no problems at today's exercise session.Patient attended the Anatomy and Physiology class today.  The patient did not have an increase in workload intensity during today's exercise session.  Pre-exercise vitals: . Weight kg: 52.7 . Liters of O2: 4 . SpO2: 98 . HR: 87 . BP: 126/58 . CBG: na  Exercise vitals: . Highest heartrate:  111 . Lowest oxygen saturation: 92 . Highest blood pressure: 150/74 . Liters of 02: 15  Post-exercise vitals: . SpO2: 97 . HR: 87 . BP: 106/66 . Liters of O2: 2 . CBG: na Dr. Brand Males, Medical Director Dr. Algis Liming is immediately available during today's Pulmonary Rehab session for [name] [name] on 03/09/2015 at 1030 class time.  Marland Kitchen

## 2015-03-11 ENCOUNTER — Encounter (HOSPITAL_COMMUNITY)
Admission: RE | Admit: 2015-03-11 | Discharge: 2015-03-11 | Disposition: A | Discharge status: Home / Self Care | Payer: Medicare Other | Source: Ambulatory Visit | Attending: Pulmonary Disease | Admitting: Pulmonary Disease

## 2015-03-11 DIAGNOSIS — J849 Interstitial pulmonary disease, unspecified: Secondary | ICD-10-CM | POA: Diagnosis not present

## 2015-03-11 NOTE — Progress Notes (Signed)
Today, [name] exercised at [employer]. Service time was from 10:30am to 12:30pm.  The patient exercised by performing aerobic, strengthening, and stretching exercises. Oxygen saturation, heart rate, blood pressure, rate of perceived exertion, and shortness of breath were all monitored before, during, and after exercise. [name] [name] presented with no problems at today's exercise session.The patient attended Pursed Lip and Diaphragmatic Breathing exercise today.  The patient did not have an increase in workload intensity during today's exercise session.  Pre-exercise vitals: . Weight kg: 53.1 . Liters of O2: 4 . SpO2: 96 . HR: 86 . BP: 106/52 . CBG: na  Exercise vitals: . Highest heartrate:  116 . Lowest oxygen saturation: 86% . Highest blood pressure: 134/60 . Liters of 02: 15  Post-exercise vitals: . SpO2: 97 . HR: 92 . BP: 108/40 . Liters of O2: 4 . CBG: na  Dr. Brand Males, Medical Director Dr. Wendee Beavers is immediately available during today's Pulmonary Rehab session for [name] [name] on 03/11/15 at 10:30am class time.

## 2015-03-12 ENCOUNTER — Ambulatory Visit (INDEPENDENT_AMBULATORY_CARE_PROVIDER_SITE_OTHER)
Admission: RE | Admit: 2015-03-12 | Discharge: 2015-03-12 | Disposition: A | Discharge status: Home / Self Care | Payer: Medicare Other | Source: Ambulatory Visit | Attending: Family | Admitting: Family

## 2015-03-12 ENCOUNTER — Ambulatory Visit (HOSPITAL_COMMUNITY)
Admission: RE | Admit: 2015-03-12 | Discharge: 2015-03-12 | Disposition: A | Discharge status: Home / Self Care | Payer: Medicare Other | Source: Ambulatory Visit | Attending: Family | Admitting: Family

## 2015-03-12 DIAGNOSIS — I6523 Occlusion and stenosis of bilateral carotid arteries: Secondary | ICD-10-CM | POA: Diagnosis not present

## 2015-03-12 DIAGNOSIS — Z95820 Peripheral vascular angioplasty status with implants and grafts: Secondary | ICD-10-CM | POA: Diagnosis not present

## 2015-03-12 DIAGNOSIS — Z48812 Encounter for surgical aftercare following surgery on the circulatory system: Secondary | ICD-10-CM | POA: Insufficient documentation

## 2015-03-12 DIAGNOSIS — I739 Peripheral vascular disease, unspecified: Secondary | ICD-10-CM

## 2015-03-15 ENCOUNTER — Encounter: Payer: Self-pay | Admitting: Family

## 2015-03-16 ENCOUNTER — Encounter (HOSPITAL_COMMUNITY): Payer: Medicare Other

## 2015-03-17 ENCOUNTER — Encounter: Payer: Self-pay | Admitting: Family

## 2015-03-17 ENCOUNTER — Ambulatory Visit (INDEPENDENT_AMBULATORY_CARE_PROVIDER_SITE_OTHER): Payer: Medicare Other | Admitting: Family

## 2015-03-17 VITALS — BP 152/72 | HR 72 | Resp 16 | Ht 60.0 in | Wt 117.0 lb

## 2015-03-17 DIAGNOSIS — Z9889 Other specified postprocedural states: Secondary | ICD-10-CM | POA: Diagnosis not present

## 2015-03-17 DIAGNOSIS — Z87891 Personal history of nicotine dependence: Secondary | ICD-10-CM | POA: Diagnosis not present

## 2015-03-17 DIAGNOSIS — I6523 Occlusion and stenosis of bilateral carotid arteries: Secondary | ICD-10-CM | POA: Diagnosis not present

## 2015-03-17 DIAGNOSIS — J8410 Pulmonary fibrosis, unspecified: Secondary | ICD-10-CM

## 2015-03-17 DIAGNOSIS — I739 Peripheral vascular disease, unspecified: Secondary | ICD-10-CM | POA: Diagnosis not present

## 2015-03-17 DIAGNOSIS — Z95828 Presence of other vascular implants and grafts: Secondary | ICD-10-CM

## 2015-03-17 NOTE — Progress Notes (Signed)
Filed Vitals:   03/17/15 0918 03/17/15 0921 03/17/15 0925  BP: 95/58 152/66 152/72  Pulse: 72 72   Resp:  16   Height:  5' (1.524 m)   Weight:  117 lb (53.071 kg)   SpO2:  100%

## 2015-03-17 NOTE — Patient Instructions (Signed)
Peripheral Vascular Disease Peripheral Vascular Disease (PVD), also called Peripheral Arterial Disease (PAD), is a circulation problem caused by cholesterol (atherosclerotic plaque) deposits in the arteries. PVD commonly occurs in the lower extremities (legs) but it can occur in other areas of the body, such as your arms. The cholesterol buildup in the arteries reduces blood flow which can cause pain and other serious problems. The presence of PVD can place a person at risk for Coronary Artery Disease (CAD).  CAUSES  Causes of PVD can be many. It is usually associated with more than one risk factor such as:   High Cholesterol.  Smoking.  Diabetes.  Lack of exercise or inactivity.  High blood pressure (hypertension).  Obesity.  Family history. SYMPTOMS   When the lower extremities are affected, patients with PVD may experience:  Leg pain with exertion or physical activity. This is called INTERMITTENT CLAUDICATION. This may present as cramping or numbness with physical activity. The location of the pain is associated with the level of blockage. For example, blockage at the abdominal level (distal abdominal aorta) may result in buttock or hip pain. Lower leg arterial blockage may result in calf pain.  As PVD becomes more severe, pain can develop with less physical activity.  In people with severe PVD, leg pain may occur at rest.  Other PVD signs and symptoms:  Leg numbness or weakness.  Coldness in the affected leg or foot, especially when compared to the other leg.  A change in leg color.  Patients with significant PVD are more prone to ulcers or sores on toes, feet or legs. These may take longer to heal or may reoccur. The ulcers or sores can become infected.  If signs and symptoms of PVD are ignored, gangrene may occur. This can result in the loss of toes or loss of an entire limb.  Not all leg pain is related to PVD. Other medical conditions can cause leg pain such  as:  Blood clots (embolism) or Deep Vein Thrombosis.  Inflammation of the blood vessels (vasculitis).  Spinal stenosis. DIAGNOSIS  Diagnosis of PVD can involve several different types of tests. These can include:  Pulse Volume Recording Method (PVR). This test is simple, painless and does not involve the use of X-rays. PVR involves measuring and comparing the blood pressure in the arms and legs. An ABI (Ankle-Brachial Index) is calculated. The normal ratio of blood pressures is 1. As this number becomes smaller, it indicates more severe disease.  < 0.95 - indicates significant narrowing in one or more leg vessels.  <0.8 - there will usually be pain in the foot, leg or buttock with exercise.  <0.4 - will usually have pain in the legs at rest.  <0.25 - usually indicates limb threatening PVD.  Doppler detection of pulses in the legs. This test is painless and checks to see if you have a pulses in your legs/feet.  A dye or contrast material (a substance that highlights the blood vessels so they show up on x-ray) may be given to help your caregiver better see the arteries for the following tests. The dye is eliminated from your body by the kidney's. Your caregiver may order blood work to check your kidney function and other laboratory values before the following tests are performed:  Magnetic Resonance Angiography (MRA). An MRA is a picture study of the blood vessels and arteries. The MRA machine uses a large magnet to produce images of the blood vessels.  Computed Tomography Angiography (CTA). A CTA   is a specialized x-ray that looks at how the blood flows in your blood vessels. An IV may be inserted into your arm so contrast dye can be injected.  Angiogram. Is a procedure that uses x-rays to look at your blood vessels. This procedure is minimally invasive, meaning a small incision (cut) is made in your groin. A small tube (catheter) is then inserted into the artery of your groin. The catheter  is guided to the blood vessel or artery your caregiver wants to examine. Contrast dye is injected into the catheter. X-rays are then taken of the blood vessel or artery. After the images are obtained, the catheter is taken out. TREATMENT  Treatment of PVD involves many interventions which may include:  Lifestyle changes:  Quitting smoking.  Exercise.  Following a low fat, low cholesterol diet.  Control of diabetes.  Foot care is very important to the PVD patient. Good foot care can help prevent infection.  Medication:  Cholesterol-lowering medicine.  Blood pressure medicine.  Anti-platelet drugs.  Certain medicines may reduce symptoms of Intermittent Claudication.  Interventional/Surgical options:  Angioplasty. An Angioplasty is a procedure that inflates a balloon in the blocked artery. This opens the blocked artery to improve blood flow.  Stent Implant. A wire mesh tube (stent) is placed in the artery. The stent expands and stays in place, allowing the artery to remain open.  Peripheral Bypass Surgery. This is a surgical procedure that reroutes the blood around a blocked artery to help improve blood flow. This type of procedure may be performed if Angioplasty or stent implants are not an option. SEEK IMMEDIATE MEDICAL CARE IF:   You develop pain or numbness in your arms or legs.  Your arm or leg turns cold, becomes blue in color.  You develop redness, warmth, swelling and pain in your arms or legs. MAKE SURE YOU:   Understand these instructions.  Will watch your condition.  Will get help right away if you are not doing well or get worse. Document Released: 11/23/2004 Document Revised: 01/08/2012 Document Reviewed: 10/20/2008 ExitCare Patient Information 2015 [employer]. This information is not intended to replace advice given to you by your health care provider. Make sure you discuss any questions you have with your health care provider.   Stroke  Prevention Some medical conditions and behaviors are associated with an increased chance of having a stroke. You may prevent a stroke by making healthy choices and managing medical conditions. HOW CAN I REDUCE MY RISK OF HAVING A STROKE?   Stay physically active. Get at least 30 minutes of activity on most or all days.  Do not smoke. It may also be helpful to avoid exposure to secondhand smoke.  Limit alcohol use. Moderate alcohol use is considered to be:  No more than 2 drinks per day for men.  No more than 1 drink per day for nonpregnant women.  Eat healthy foods. This involves:  Eating 5 or more servings of fruits and vegetables a day.  Making dietary changes that address high blood pressure (hypertension), high cholesterol, diabetes, or obesity.  Manage your cholesterol levels.  Making food choices that are high in fiber and low in saturated fat, trans fat, and cholesterol may control cholesterol levels.  Take any prescribed medicines to control cholesterol as directed by your health care provider.  Manage your diabetes.  Controlling your carbohydrate and sugar intake is recommended to manage diabetes.  Take any prescribed medicines to control diabetes as directed by your health care provider.    Control your hypertension.  Making food choices that are low in salt (sodium), saturated fat, trans fat, and cholesterol is recommended to manage hypertension.  Take any prescribed medicines to control hypertension as directed by your health care provider.  Maintain a healthy weight.  Reducing calorie intake and making food choices that are low in sodium, saturated fat, trans fat, and cholesterol are recommended to manage weight.  Stop drug abuse.  Avoid taking birth control pills.  Talk to your health care provider about the risks of taking birth control pills if you are over 35 years old, smoke, get migraines, or have ever had a blood clot.  Get evaluated for sleep  disorders (sleep apnea).  Talk to your health care provider about getting a sleep evaluation if you snore a lot or have excessive sleepiness.  Take medicines only as directed by your health care provider.  For some people, aspirin or blood thinners (anticoagulants) are helpful in reducing the risk of forming abnormal blood clots that can lead to stroke. If you have the irregular heart rhythm of atrial fibrillation, you should be on a blood thinner unless there is a good reason you cannot take them.  Understand all your medicine instructions.  Make sure that other conditions (such as anemia or atherosclerosis) are addressed. SEEK IMMEDIATE MEDICAL CARE IF:   You have sudden weakness or numbness of the face, arm, or leg, especially on one side of the body.  Your face or eyelid droops to one side.  You have sudden confusion.  You have trouble speaking (aphasia) or understanding.  You have sudden trouble seeing in one or both eyes.  You have sudden trouble walking.  You have dizziness.  You have a loss of balance or coordination.  You have a sudden, severe headache with no known cause.  You have new chest pain or an irregular heartbeat. Any of these symptoms may represent a serious problem that is an emergency. Do not wait to see if the symptoms will go away. Get medical help at once. Call your local emergency services (911 in [place] Do not drive yourself to the hospital. Document Released: 11/23/2004 Document Revised: 03/02/2014 Document Reviewed: 04/18/2013 ExitCare Patient Information 2015 [employer]. This information is not intended to replace advice given to you by your health care provider. Make sure you discuss any questions you have with your health care provider.  

## 2015-03-17 NOTE — Progress Notes (Signed)
[facility] [place] HISTORY AND PHYSICAL   MRN : [id number]  History of Present Illness:   [name] [name] is a 69 y.o. female patient of Dr. Donnetta Hutching who is status post aortobifemoral bypass and left fem-pop grafting in 2010. She then had right fem-pop. bypass graft in March, 2013 with subsequent revision on 06/17/12.  She is also being monitored for carotid artery stenosis. She returns today for scheduled LE ABI's, left LE Duplex, and carotid Duplex surveillance.  She states that she had a stress test and it was normal.  She has a history of hyperthyroidism with accompanying tachycardia, but she denies knowing of any atrial fib. She had a thyroid ablation in 2004 and has had no tachycardia since then. She denies any history of heart disease and has not had any cardiac interventions.   She had a lung biopsy on Oct. 8, 2014 for progressive scar tissue noted on imaging, followed by Dr. Gwenette Greet, pulmonologist, no malignancy found, she was diagnosed with pulmonary fibrosis, etiology unknown. She is taking prednisone, likely long term. She has had no recent dyspnea, using portable supplemental O2 most of the time at 2-3 L/min.  She states her blood pressure increases in a medical office, usually runs 127/60 at home.  Dr. Peter Martinique is her cardiolgist.  Pt. denies claudication in lower extremities, denies rest pain, denies night pain, denies non healing ulcers on either extremity.   The patient denies tingling, numbness, or pain in either hand or arm.  Patient denies any history of TIA or stroke symptom, specifically she denies a history of amaurosis fugax or monocular blindness, denies a history of facial drooping, denies a history of hemiplegia, denies a history of receptive or expressive aphasia.   Pt Diabetic: No  Pt smoker: former smoker, quit 2002  Pt meds include:  Statin :Yes  ASA: Yes  Other anticoagulants/antiplatelets: no   Current Outpatient  Prescriptions  Medication Sig Dispense Refill  . acetaminophen (TYLENOL) 500 MG tablet Take 500 mg by mouth daily as needed. For headache    . aspirin EC 81 MG tablet Take 81 mg by mouth at bedtime.    Marland Kitchen dextromethorphan-guaiFENesin (MUCINEX DM) 30-600 MG per 12 hr tablet Take 1 tablet by mouth 2 (two) times daily as needed for cough.    . levothyroxine (SYNTHROID, LEVOTHROID) 112 MCG tablet Take 112 mcg by mouth daily.     . mycophenolate (CELLCEPT) 500 MG tablet Take 2 tablets twice daily 60 tablet 6  . predniSONE (DELTASONE) 10 MG tablet Take as directed 100 tablet 1  . simvastatin (ZOCOR) 40 MG tablet Take 40 mg by mouth at bedtime.     . sulfamethoxazole-trimethoprim (BACTRIM DS,SEPTRA DS) 800-160 MG per tablet Take 1 tablet in the AM 3 days a week on M,W,F 12 tablet 6   No current facility-administered medications for this visit.    Past Medical History  Diagnosis Date  . Peripheral arterial disease   . Hyperlipidemia   . Thyroid disease     Hypothyroidism  . Hypothyroidism   . Blood transfusion     2 /12 yrs ago  . Arthritis   . Arrhythmia     when hyperthyroid  . Dyspnea on exertion 01/22/2013  . GERD (gastroesophageal reflux disease)     otc    Social History History  Substance Use Topics  . Smoking status: Former Smoker -- 1.00 packs/day for 20 years    Types: Cigarettes    Quit date: 09/07/1999  .  Smokeless tobacco: Never Used  . Alcohol Use: 4.2 oz/week    7 Glasses of wine per week     Comment: wine-- 5 glasses a week    Family History Family History  Problem Relation Age of Onset  . Anesthesia problems Neg Hx   . Hypotension Neg Hx   . Malignant hyperthermia Neg Hx   . Pseudochol deficiency Neg Hx   . COPD Mother   . Cancer Mother     BREAST  . Cancer Father     LUNG  . Cancer Brother     Lymphoma  . Heart attack Brother   . Lymphoma Brother   . Heart disease Brother     Heart disease before age 64  . Cancer Brother     colon   . Cancer -  Colon Sister   . Cancer Sister     Colon    Surgical History Past Surgical History  Procedure Laterality Date  . Pr vein bypass graft,aorto-fem-pop  03/02/09    Aortobifemoral BPG, Bilateral fem- popliteal BPG  . Hand surgery      cyst removed left hand  . Appendectomy      45 yrs ago  . Femoral-popliteal bypass graft  09/11/2011    Procedure: BYPASS GRAFT FEMORAL-POPLITEAL ARTERY;  Surgeon: Rosetta Posner, MD;  Location: [facility];  Service: Vascular;  Laterality: Right;  Thrombectomy right femoral popliteal bypass with intraoperative arteriogram  . Embolectomy  09/12/2011    Procedure: EMBOLECTOMY;  Surgeon: Theotis Burrow, MD;  Location: [facility] OR;  Service: Vascular;  Laterality: Right;  Embolectomy Right Femoral -Popliteal Bypass Graft, Revision of Distal Anastomosis  . Femoral-popliteal bypass graft  09/11/2011    Procedure: BYPASS GRAFT FEMORAL-POPLITEAL ARTERY;  Surgeon: Rosetta Posner, MD;  Location: [facility];  Service: Vascular;  Laterality: Right;  Thrombectomy right femoral popliteal bypass with intraoperative arteriogram  . Breast surgery      duct removed left breast  . Femoral-popliteal bypass graft  01/17/2012    Procedure: BYPASS GRAFT FEMORAL-POPLITEAL ARTERY;  Surgeon: Rosetta Posner, MD;  Location: [facility] OR;  Service: Vascular;  Laterality: Right;  right femoral to below knee popliteal bypass graft with vein  . Intraoperative arteriogram  01/17/2012    Procedure: INTRA OPERATIVE ARTERIOGRAM;  Surgeon: Rosetta Posner, MD;  Location: [facility] OR;  Service: Vascular;  Laterality: Right;  . Abdominal hysterectomy    . Femoral-popliteal bypass graft  06/17/2012    Procedure: BYPASS GRAFT FEMORAL-POPLITEAL ARTERY;  Surgeon: Rosetta Posner, MD;  Location: [facility] OR;  Service: Vascular;  Laterality: Right;  revision  . Cardiovascular stress test  May 2014  . Pulmonary stress test  July 2014  . Video assisted thoracoscopy Left 08/11/2013    Procedure: VIDEO ASSISTED THORACOSCOPY;  Surgeon: Melrose Nakayama, MD;  Location: [employer] [facility];  Service: Thoracic;  Laterality: Left;  LEFT  VATS, LUNG BX  . Lung biopsy  Oct. 2014  . Cataract extraction    . Eye surgery    . Abdominal aortagram N/A 09/08/2011    Procedure: ABDOMINAL Maxcine Ham;  Surgeon: Elam Dutch, MD;  Location[facility] C[facility] LAB;  Service: Cardiovascular;  Laterality: N/A;  . Lower extremity angiogram N/A 09/08/2011    Procedure: LOWER EXTREMITY ANGIOGRAM;  Surgeon: Elam Dutch, MD;  Location: [facility] CATH LAB;  Service: Cardiovascular;  Laterality: N/A;  . Abdominal aortagram N/A 05/29/2012    Procedure: ABDOMINAL AORTAGRAM;  Surgeon: Rosetta Posner, MD;  Location: [facility] CATH  LAB;  Service: Cardiovascular;  Laterality: N/A;    Allergies  Allergen Reactions  . Morphine And Related Nausea And Vomiting    Current Outpatient Prescriptions  Medication Sig Dispense Refill  . acetaminophen (TYLENOL) 500 MG tablet Take 500 mg by mouth daily as needed. For headache    . aspirin EC 81 MG tablet Take 81 mg by mouth at bedtime.    Marland Kitchen dextromethorphan-guaiFENesin (MUCINEX DM) 30-600 MG per 12 hr tablet Take 1 tablet by mouth 2 (two) times daily as needed for cough.    . levothyroxine (SYNTHROID, LEVOTHROID) 112 MCG tablet Take 112 mcg by mouth daily.     . mycophenolate (CELLCEPT) 500 MG tablet Take 2 tablets twice daily 60 tablet 6  . predniSONE (DELTASONE) 10 MG tablet Take as directed 100 tablet 1  . simvastatin (ZOCOR) 40 MG tablet Take 40 mg by mouth at bedtime.     . sulfamethoxazole-trimethoprim (BACTRIM DS,SEPTRA DS) 800-160 MG per tablet Take 1 tablet in the AM 3 days a week on M,W,F 12 tablet 6   No current facility-administered medications for this visit.     REVIEW OF SYSTEMS: See HPI for pertinent positives and negatives.  Physical Examination Filed Vitals:   03/17/15 0918 03/17/15 0921 03/17/15 0925  BP: 95/58 152/66 152/72  Pulse: 72 72   Resp:  16   Height:  5' (1.524 m)   Weight:  117 lb (53.071 kg)   SpO2:  100%     Body mass index is 22.85 kg/(m^2).   General: WDWN in NAD  Gait: Normal  HENT: WNL  Eyes: Pupils equal  Pulmonary: normal non-labored breathing , without Rales, rhonchi, or wheezing. Is wearing nasal canula, supplemental O2 at 2L/minute  Cardiac: RRR, no murmurs detected  Abdomen: soft, NT, no masses palpated  Skin: no rashes, no ulcers; no Gangrene , no cellulitis; no open wounds.  VASCULAR EXAM  Carotid Bruits  Left  Right    positive Negative    Radial pulses: right is faintly palpable, right brachial is 2+ palpable, left radial is 2+ palpable Aorta is not palpable  VASCULAR EXAM:  Extremities without ischemic changes  without Gangrene; without open wounds.   LE Pulses  LEFT  RIGHT   FEMORAL  2+palpable  3+palpable   POPLITEAL  not palpable  not palpable   POSTERIOR TIBIAL  not palpable  Not palpable   DORSALIS PEDIS  ANTERIOR TIBIAL  not palpable  2+palpable    Musculoskeletal: no muscle wasting or atrophy; no peripheral edema  Neurologic: A&O X 3; Appropriate Affect ;  SENSATION: normal;  MOTOR FUNCTION: 5/5 Symmetric, CN 2-12 intact  Speech is fluent/normal          Non-Invasive Vascular Imaging (03/12/2015):   CEREBROVASCULAR DUPLEX EVALUATION    INDICATION: Follow-up carotid disease     PREVIOUS INTERVENTION(S):     DUPLEX EXAM:     RIGHT  LEFT  Peak Systolic Velocities (cm/s) End Diastolic Velocities (cm/s) Plaque LOCATION Peak Systolic Velocities (cm/s) End Diastolic Velocities (cm/s) Plaque  104 10  CCA PROXIMAL 106 18   115 13  CCA MID 72 16   112 12  CCA DISTAL 53 11   207 0 HT/CP ECA 104 8 HT/CP  172 33 HT/CP ICA PROXIMAL 167 26 HT/CP  162 22  ICA MID 196 26   82 15  ICA DISTAL 115 21     1.5 ICA / CCA Ratio (PSV) 3.2  Antegrade  Vertebral Flow Antegrade  67 Brachial Systolic Pressure (mmHg) 259  Monophasic  Brachial Artery Waveforms Within normal limits     Plaque Morphology:  HM = Homogeneous,  HT = Heterogeneous, CP = Calcific Plaque, SP = Smooth Plaque, IP = Irregular Plaque     ADDITIONAL FINDINGS: Right subclavian artery velocity: Proximal-284cm/second (stenotic),  Mid- 105cm/second (mono) Left subclavian artery velocity: 222cm/second (within normal limits)    IMPRESSION: 1. Velocities suggest 1%-49% stenosis of the bilateral internal carotid artery; however, plaque appears worse with calcific shadow that may underestimate velocities. 2. Right subclavian artery is monophasic beyond the origin which cannot be adequately visualized due to anatomy. A significant brachial blood pressure gradient is present. 3. Bilateral vertebral artery is antegrade.    Compared to the previous exam:  No significant change compared to prior exam.      LOWER EXTREMITY ARTERIAL DUPLEX EVALUATION    INDICATION: Follow-up right femoropopliteal arterial bypass graft     PREVIOUS INTERVENTION(S): Aortobifemoral arterial bypass graft with bilateral femoropopliteal arterial bypass grafts placed 03/02/2009 Redo right graft 01/17/2012 and 06/17/2012 Known occlusion of left graft.    DUPLEX EXAM:     RIGHT  LEFT   Peak Systolic Velocity (cm/s) Ratio (if abnormal) Waveform  Peak Systolic Velocity (cm/s) Ratio (if abnormal) Waveform  290  B Inflow Artery     256  B Proximal Anastomosis     117  B Proximal Graft     117  B Mid Graft     124  B  Distal Graft     93  B Distal Anastomosis     98  B Outflow Artery     1.17 Today's ABI / TBI 0.81  1.16 Previous ABI / TBI (08/13/2014  ) 0.69    Waveform:    M - Monophasic       B - Biphasic       T - Triphasic  If Ankle Brachial Index (ABI) or Toe Brachial Index (TBI) performed, please see complete report     ADDITIONAL FINDINGS: See impression on following page.      IMPRESSION: Patent right femoropopliteal arterial bypass graft with evidence of disease in the native inflow/proximal anastomosis which is difficult to fully evaluate due to scarring and  multiple surgeries.    Compared to the previous exam:  Previous exam suggested 50%-70% stenosis of the proximal anastomosis.      ASSESSMENT:  [name] [name] is a 69 y.o. female who is status post aortobifemoral bypass and left fem-pop. grafting in 2010. She then had right fem-pop. bypass graft in March, 2013 with subsequent revision on 06/17/12.  She also has mild carotid artery stenosis with right subclavian artery stenosis, has no steal symptoms in either upper extremity, no dizziness.  She has no history of stroke or TIA.  She has no LE claudication symptoms, no tissue loss. At her evaluation in March, 2015 it was found on Duplex that the left fem-pop graft was occluded, and the left anterior tibial artery was occluded, but she has no claudication symptoms. Six months prior to that both LE bypass grafts were patent. Her right ABI remains normal with triphasic waveforms, left ABI has improved from 0.69 to 0.81, monophasic waveforms, due to her diligent exercise program of her legs. She is very motivated to do well and was congratulated on her efforts.  03/12/15 carotid Duplex suggests minimal bilateral ICA stenoses. Right subclavian artery is monophasic beyond the origin which cannot be adequately visualized due to anatomy. A significant  brachial blood pressure gradient is present. Bilateral vertebral artery is antegrade.  03/12/2015 right LE arterial Duplex suggests a patent right femoropopliteal arterial bypass graft with evidence of disease in the native inflow/proximal anastomosis which is difficult to fully evaluate due to scarring and multiple surgeries. Previous exam suggested 50%-70% stenosis of the proximal anastomosis.   PLAN:   Continue graduated walking program, add resistance arm exercises as discussed.  Based on today's exam and non-invasive vascular lab results, the patient will follow up in 1 year with the following tests: carotid Duplex, bilateral LE arterial Duplex, and  ABI's. I discussed in depth with the patient the nature of atherosclerosis, and emphasized the importance of maximal medical management including strict control of blood pressure, blood glucose, and lipid levels, obtaining regular exercise, and cessation of smoking.  The patient is aware that without maximal medical management the underlying atherosclerotic disease process will progress, limiting the benefit of any interventions.  The patient was given information about stroke prevention and what symptoms should prompt the patient to seek immediate medical care.  The patient was given information about PAD including signs, symptoms, treatment, what symptoms should prompt the patient to seek immediate medical care, and risk reduction measures to take. Thank you for allowing [place] to participate in this patient's care.  Clemon Chambers, RN, MSN, FNP-C [facility] Office: [phone]  Clinic MD: Scot Dock  03/17/2015 9:12 AM

## 2015-03-23 NOTE — Addendum Note (Signed)
Encounter addended by: Benedetto Goad, RN on: 03/23/2015  7:35 AM<BR>     Documentation filed: Notes Section

## 2015-03-23 NOTE — Progress Notes (Addendum)
Pulmonary Rehab Discharge Note: Eloise has been discharged after completing 23 exercise and education sessions. Tienna will complete her discharge 6 min walk test when she enrolls in the pulmonary rehab maintenance exercise program. Hasna was discharged with 1 exercise session left to complete related to an outbreak of shingles. Timea did well in the program as evidenced by her tolerance of increased work loads. Her last day of attendance she was able to walk 15 laps in 15 min and averaged mets of 2.9 on the NuStep with a workload of 7. Niyonna states she is eager to begin exercising again as soon as she is physically able. Yoshiye met her personal goal of regaining her strength and endurance but unfortunately has been unable to "get off oxygen". She actually has had an increase in her O2 usage and needs for exercise. She is now on 15 liters. She has gained acceptance of her need of O2 and has actually pursued lung transplant options at [facility].

## 2015-03-23 NOTE — Addendum Note (Signed)
Encounter addended by: Benedetto Goad, RN on: 03/23/2015  7:51 AM<BR>     Documentation filed: Notes Section

## 2015-04-03 ENCOUNTER — Other Ambulatory Visit: Payer: Self-pay | Admitting: Pulmonary Disease

## 2015-05-10 ENCOUNTER — Telehealth: Payer: Self-pay | Admitting: *Deleted

## 2015-05-10 MED ORDER — SULFAMETHOXAZOLE-TRIMETHOPRIM 800-160 MG PO TABS: ORAL_TABLET | ORAL | Status: DC

## 2015-05-10 MED ORDER — PREDNISONE 10 MG PO TABS: ORAL_TABLET | ORAL | Status: DC

## 2015-05-10 NOTE — Telephone Encounter (Signed)
Prednisone last refilled 04/05/15 #30 x 0 refills bactrim DS last refilled 12//15/15 #12 tab x 6 refills Pt has pending appt with BQ on 05/26/15 Please advise TP if okay to refill since Sharpes no longer in office? thanks

## 2015-05-10 NOTE — Telephone Encounter (Signed)
That is fine with 1 refill until seen by McQuaid  She was just seen in April

## 2015-05-10 NOTE — Telephone Encounter (Signed)
RX's sent in. Nothing further needed

## 2015-05-26 ENCOUNTER — Other Ambulatory Visit (INDEPENDENT_AMBULATORY_CARE_PROVIDER_SITE_OTHER): Payer: Medicare Other

## 2015-05-26 ENCOUNTER — Ambulatory Visit (INDEPENDENT_AMBULATORY_CARE_PROVIDER_SITE_OTHER): Payer: Medicare Other | Admitting: Pulmonary Disease

## 2015-05-26 ENCOUNTER — Encounter: Payer: Self-pay | Admitting: Pulmonary Disease

## 2015-05-26 VITALS — BP 144/72 | HR 105 | Ht 60.0 in | Wt 119.0 lb

## 2015-05-26 DIAGNOSIS — I272 Pulmonary hypertension, unspecified: Secondary | ICD-10-CM

## 2015-05-26 DIAGNOSIS — I270 Primary pulmonary hypertension: Secondary | ICD-10-CM | POA: Diagnosis not present

## 2015-05-26 DIAGNOSIS — Z5181 Encounter for therapeutic drug level monitoring: Secondary | ICD-10-CM

## 2015-05-26 DIAGNOSIS — I70219 Atherosclerosis of native arteries of extremities with intermittent claudication, unspecified extremity: Secondary | ICD-10-CM

## 2015-05-26 DIAGNOSIS — J849 Interstitial pulmonary disease, unspecified: Secondary | ICD-10-CM

## 2015-05-26 LAB — HEPATIC FUNCTION PANEL
ALBUMIN: 3.6 g/dL (ref 3.5–5.2)
ALT: 23 U/L (ref 0–35)
AST: 23 U/L (ref 0–37)
Alkaline Phosphatase: 66 U/L (ref 39–117)
Bilirubin, Direct: 0.2 mg/dL (ref 0.0–0.3)
Total Bilirubin: 0.6 mg/dL (ref 0.2–1.2)
Total Protein: 6.1 g/dL (ref 6.0–8.3)

## 2015-05-26 LAB — BASIC METABOLIC PANEL
BUN: 15 mg/dL (ref 6–23)
CALCIUM: 8.8 mg/dL (ref 8.4–10.5)
CO2: 26 mEq/L (ref 19–32)
CREATININE: 1.23 mg/dL — AB (ref 0.40–1.20)
Chloride: 103 mEq/L (ref 96–112)
GFR: 46.02 mL/min — ABNORMAL LOW (ref >60.00)
GLUCOSE: 132 mg/dL — AB (ref 70–99)
POTASSIUM: 4.2 meq/L (ref 3.5–5.1)
SODIUM: 137 meq/L (ref 135–145)

## 2015-05-26 LAB — CBC WITH DIFFERENTIAL/PLATELET
Basophils Absolute: 0.1 10*3/uL (ref 0.0–0.1)
Basophils Relative: 0.4 % (ref 0.0–3.0)
Eosinophils Absolute: 0 10*3/uL (ref 0.0–0.7)
Eosinophils Relative: 0.2 % (ref 0.0–5.0)
HCT: 48.6 % — ABNORMAL HIGH (ref 36.0–46.0)
HEMOGLOBIN: 15.8 g/dL — AB (ref 12.0–15.0)
LYMPHS ABS: 0.6 10*3/uL — AB (ref 0.7–4.0)
LYMPHS PCT: 4.4 % — AB (ref 12.0–46.0)
MCHC: 32.4 g/dL (ref 30.0–36.0)
MCV: 84.3 fl (ref 78.0–100.0)
MONOS PCT: 3.2 % (ref 3.0–12.0)
Monocytes Absolute: 0.4 10*3/uL (ref 0.1–1.0)
Neutro Abs: 12.2 10*3/uL — ABNORMAL HIGH (ref 1.4–7.7)
Neutrophils Relative %: 91.8 % — ABNORMAL HIGH (ref 43.0–77.0)
Platelets: 228 10*3/uL (ref 150.0–400.0)
RBC: 5.76 Mil/uL — AB (ref 3.87–5.11)
RDW: 15.9 % — AB (ref 11.5–15.5)

## 2015-05-26 MED ORDER — FUROSEMIDE 20 MG PO TABS: 20.0000 mg | ORAL_TABLET | Freq: Every day | ORAL | Status: DC

## 2015-05-26 NOTE — Patient Instructions (Signed)
We will arrange a high-resolution CT scan of your chest in call you with the results We will arrange a pulmonary function test Keep taking your medications as you're doing Try taking lasix 20mg  daily for a week and let me know if this helps with your breathing Go back to see Dr. Martinique

## 2015-05-26 NOTE — Progress Notes (Signed)
Subjective:    Patient ID: [name] [name], female    DOB: 30-Jun-1946, 69 y.o.   MRN: [id number]  Synopsis: Former Patient of Dr. Gwenette [name] with fibrosing NSIP PFT's 03/2013:  No obstruction, TLC 66% pred, DLCO 29% with partial correction with Av.  CXR 11/2012: mild cm, no acute process.  Muscle pressures 04/2013 normal HRCT 04/2013:  IS/GG changes with nonspecific pattern  Autoimmune panel 06/2013: normal VATS bx 07/2013:  Not c/w UIP, most c/w fibrosing variant of NSIP.  Has scattered multinucleated giant cells. Pt denies any exposure hx, unusual pets, unusual hobbies. HP panel 08/2013:  Not overly impressive.  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  Pt has done an environmental survey at home.  02/2014:  New complaint of joint swelling and pain >> refer to rheum [name] [name]).  Cellcept started 09/15/14 She had a lung transplant evaluation at [facility] in 2016 but failed transplant evaluation due to peripheral vascular disease.   HPI Chief Complaint  Patient presents with  . Follow-up    [name]- c/o worsening sob with any exertion.  States she feels like she cannot take a deep enough breath.     [name] [name] felt like she was doing really well until she got Shingles.  She still has a lot of pain in her foot and scalp now from that.  She is now on Lyrica whic hsi helping some.  She has been on that for a week.  The Shingles kept her out of pulmonary rehab.  She is using more oxygen at rest now, particularly while at rest.  Now using 6 liters at rest but this is to keep her O2 saturation in the 90's. She is coughing more but not bringing up mucus.  She has occassional chest congestion.  She has clear, sticky mucus.  She had some leg swelling about two weeks ago which has since resolved. She is still taking cellcept and prednisone and she wants to stop it.   Past Medical History  Diagnosis Date  . Peripheral arterial disease   . Hyperlipidemia   . Thyroid disease     Hypothyroidism  .  Hypothyroidism   . Blood transfusion     2 /12 yrs ago  . Arthritis   . Arrhythmia     when hyperthyroid  . Dyspnea on exertion 01/22/2013  . GERD (gastroesophageal reflux disease)     otc  . Carotid artery occlusion       Review of Systems  Constitutional: Negative for fever, chills and fatigue.  HENT: Negative for nosebleeds, postnasal drip and rhinorrhea.   Respiratory: Positive for cough and shortness of breath. Negative for wheezing.   Cardiovascular: Negative for chest pain, palpitations and leg swelling.       Objective:   Physical Exam Filed Vitals:   05/26/15 1529  BP: 144/72  Pulse: 105  Height: 5' (1.524 m)  Weight: 119 lb (53.978 kg)  SpO2: 86%   5 L [place]  Gen: chronically ill appearing HENT: OP clear, TM's clear, neck supple PULM: Crackles bilaterally, normal percussion CV: RRR, no mgr, trace edema GI: BS+, soft, nontender Derm: no cyanosis or rash Psyche: normal mood and affect  2016 [facility]: RA: 12 RV: 68 12 14  PA: 68 26 41 PCW: $Remov'22 23 22 'kvpOOM$ Oximetry Site Hgb (g/dL) O2 sat (%)  FA: 14.1 97.3  PA: 14.3 70.3  PA: 14.3 72.1  Calculations O2 consumption: 164.1 ML O2/min (assumed fick) EPBF (Qep): 3.3 L/min  Mean  Hgb: 14.2 g/dl Shunt ratio: 1.0 (Qp/Qs)  AV O2: 5.0 Vol% L to R shunt: 0.0 L/min (Qp-Qep)  PBF (Qp): 3.3 L/min R to L shunt: 0.0 L/min (Qs-Qep)  Cardiac output (Qs): 3.3 L/min  Cardiac Index: 2.2 L/min/m2  PVR: 5.8 wood units  SVR: 29.5 wood units   I first met reviewed the records from [facility] which showed that she was turned down from lung transplantation because of her esophageal dysmotility (interpreted as moderate), her pulmonary hypertension, and her vascular disease. There was mention of coronary artery disease but I cannot find the left heart catheterization records.      Assessment & Plan:  Interstitial lung disease This is clearly a complicated case. She has very advanced her fibrosis which was felt to be likely consistent with  an S IP, however the pathology report described significant emphysema as well as "smoker's interstitial lung disease" with multinucleated giant cells. It's not clear to me that she has had improvement with immunosuppressive therapy.  Apparently she failed transplant evaluation because of pulmonary hypertension, her right heart catheterization there showed an elevated pulmonary capillary wedge pressure. The notes from [facility] also noted esophageal dysmotility as well as cardiovascular disease.  I'm concerned about the fact that her dyspnea and oxygenation needs have been worsening recently. This may be related to pulmonary edema as the pulmonary capillary wedge pressure was elevated on her right heart cath. However, the more worrisome finding would be progressive interstitial lung disease.  Plan: Continue methotrexate and prednisone, we will plan for 18 months Continue pulmonary rehabilitation Continue O2 with goal O2 saturation greater than 85% Repeat pulmonary function test and CT chest considering worsening pulmonary fibrosis I will contact the [facility] transplant team for further information on why she was rejected She may need to be referred to either [facility] or [place] for lung transplantation Trial Lasix Follow-up in 3-4 weeks  Atherosclerosis of native arteries of extremity with intermittent claudication In addition to her known peripheral vascular disease she was found at [facility] to have coronary artery disease on the left heart catheterization. I have asked her to go back to see her cardiologist.  Pulmonary hypertension I have reviewed the records of her right heart catheterization from [facility] which showed pulmonary hypertension and an elevated pulmonary capillary wedge pressure. Because of that are not diurese her today with lasix.       Current outpatient prescriptions:  .  acetaminophen (TYLENOL) 500 MG tablet, Take 500 mg by mouth daily as needed. For headache, Disp: , Rfl:  .   aspirin EC 81 MG tablet, Take 81 mg by mouth at bedtime., Disp: , Rfl:  .  dextromethorphan-guaiFENesin (MUCINEX DM) 30-600 MG per 12 hr tablet, Take 1 tablet by mouth 2 (two) times daily as needed for cough., Disp: , Rfl:  .  levothyroxine (SYNTHROID, LEVOTHROID) 112 MCG tablet, Take 112 mcg by mouth daily. , Disp: , Rfl:  .  mycophenolate (CELLCEPT) 500 MG tablet, Take 2 tablets twice daily, Disp: 60 tablet, Rfl: 6 .  predniSONE (DELTASONE) 10 MG tablet, TAKE AS DIRECTED, Disp: 30 tablet, Rfl: 0 .  simvastatin (ZOCOR) 40 MG tablet, Take 40 mg by mouth at bedtime. , Disp: , Rfl:  .  sulfamethoxazole-trimethoprim (BACTRIM DS,SEPTRA DS) 800-160 MG per tablet, Take 1 tablet in the AM 3 days a week on M,W,F, Disp: 12 tablet, Rfl: 1 .  furosemide (LASIX) 20 MG tablet, Take 1 tablet (20 mg total) by mouth daily., Disp: 5 tablet, Rfl: 0

## 2015-05-26 NOTE — Assessment & Plan Note (Signed)
In addition to her known peripheral vascular disease she was found at [facility] to have coronary artery disease on the left heart catheterization. I have asked her to go back to see her cardiologist.

## 2015-05-26 NOTE — Assessment & Plan Note (Signed)
I have reviewed the records of her right heart catheterization from [place] which showed pulmonary hypertension and an elevated pulmonary capillary wedge pressure. Because of that are not diurese her today with lasix.

## 2015-05-26 NOTE — Assessment & Plan Note (Signed)
This is clearly a complicated case. She has very advanced her fibrosis which was felt to be likely consistent with an S IP, however the pathology report described significant emphysema as well as "smoker's interstitial lung disease" with multinucleated giant cells. It's not clear to me that she has had improvement with immunosuppressive therapy.  Apparently she failed transplant evaluation because of pulmonary hypertension, her right heart catheterization there showed an elevated pulmonary capillary wedge pressure. The notes from [place] also noted esophageal dysmotility as well as cardiovascular disease.  I'm concerned about the fact that her dyspnea and oxygenation needs have been worsening recently. This may be related to pulmonary edema as the pulmonary capillary wedge pressure was elevated on her right heart cath. However, the more worrisome finding would be progressive interstitial lung disease.  Plan: Continue methotrexate and prednisone, we will plan for 18 months Continue pulmonary rehabilitation Continue O2 with goal O2 saturation greater than 85% Repeat pulmonary function test and CT chest considering worsening pulmonary fibrosis I will contact the [facility] transplant team for further information on why she was rejected She may need to be referred to either [facility] or [place] for lung transplantation Trial Lasix Follow-up in 3-4 weeks

## 2015-05-27 ENCOUNTER — Other Ambulatory Visit: Payer: Self-pay

## 2015-05-27 DIAGNOSIS — Z5181 Encounter for therapeutic drug level monitoring: Secondary | ICD-10-CM

## 2015-05-28 ENCOUNTER — Other Ambulatory Visit (INDEPENDENT_AMBULATORY_CARE_PROVIDER_SITE_OTHER): Payer: Medicare Other

## 2015-05-28 ENCOUNTER — Ambulatory Visit (HOSPITAL_COMMUNITY)
Admission: RE | Admit: 2015-05-28 | Discharge: 2015-05-28 | Disposition: A | Discharge status: Home / Self Care | Payer: Medicare Other | Source: Ambulatory Visit | Attending: Pulmonary Disease | Admitting: Pulmonary Disease

## 2015-05-28 DIAGNOSIS — J849 Interstitial pulmonary disease, unspecified: Secondary | ICD-10-CM | POA: Diagnosis present

## 2015-05-28 DIAGNOSIS — Z5181 Encounter for therapeutic drug level monitoring: Secondary | ICD-10-CM

## 2015-05-28 LAB — PULMONARY FUNCTION TEST
DL/VA % pred: 30 %
DL/VA: 1.3 ml/min/mmHg/L
DLCO cor % pred: 13 %
DLCO cor: 2.57 ml/min/mmHg
DLCO unc % pred: 14 %
DLCO unc: 2.74 ml/min/mmHg
FEF 25-75 Post: 1.38 L/sec
FEF 25-75 Pre: 1.75 L/sec
FEF2575-%Change-Post: -21 %
FEF2575-%PRED-POST: 79 %
FEF2575-%PRED-PRE: 101 %
FEV1-%Change-Post: -2 %
FEV1-%PRED-POST: 64 %
FEV1-%PRED-PRE: 66 %
FEV1-POST: 1.25 L
FEV1-Pre: 1.28 L
FEV1FVC-%CHANGE-POST: -4 %
FEV1FVC-%Pred-Pre: 120 %
FEV6-%Change-Post: 1 %
FEV6-%PRED-POST: 58 %
FEV6-%Pred-Pre: 57 %
FEV6-Post: 1.42 L
FEV6-Pre: 1.4 L
FEV6FVC-%Change-Post: 0 %
FEV6FVC-%PRED-POST: 105 %
FEV6FVC-%PRED-PRE: 105 %
FVC-%Change-Post: 2 %
FVC-%PRED-POST: 55 %
FVC-%Pred-Pre: 54 %
FVC-PRE: 1.4 L
FVC-Post: 1.42 L
POST FEV6/FVC RATIO: 100 %
PRE FEV1/FVC RATIO: 92 %
PRE FEV6/FVC RATIO: 100 %
Post FEV1/FVC ratio: 88 %
RV % PRED: 40 %
RV: 0.79 L
TLC % pred: 55 %
TLC: 2.46 L

## 2015-05-28 LAB — BASIC METABOLIC PANEL
BUN: 15 mg/dL (ref 6–23)
CHLORIDE: 101 meq/L (ref 96–112)
CO2: 22 mEq/L (ref 19–32)
Calcium: 9 mg/dL (ref 8.4–10.5)
Creatinine, Ser: 1.22 mg/dL — ABNORMAL HIGH (ref 0.40–1.20)
GFR: 46.46 mL/min — AB (ref >60.00)
Glucose, Bld: 122 mg/dL — ABNORMAL HIGH (ref 70–99)
Potassium: 4.4 mEq/L (ref 3.5–5.1)
Sodium: 137 mEq/L (ref 135–145)

## 2015-05-28 MED ORDER — ALBUTEROL SULFATE (2.5 MG/3ML) 0.083% IN NEBU
2.5000 mg | INHALATION_SOLUTION | Freq: Once | RESPIRATORY_TRACT | Status: AC
  Administered 2015-05-28: 2.5 mg via RESPIRATORY_TRACT

## 2015-05-31 ENCOUNTER — Telehealth: Payer: Self-pay | Admitting: Pulmonary Disease

## 2015-05-31 MED ORDER — MYCOPHENOLATE MOFETIL 500 MG PO TABS: ORAL_TABLET | ORAL | Status: DC

## 2015-05-31 NOTE — Telephone Encounter (Signed)
Spoke with the pt to verify the msg  Rx was sent to pharm  Nothing further needed

## 2015-06-02 ENCOUNTER — Telehealth: Payer: Self-pay | Admitting: Pulmonary Disease

## 2015-06-02 ENCOUNTER — Ambulatory Visit (INDEPENDENT_AMBULATORY_CARE_PROVIDER_SITE_OTHER)
Admission: RE | Admit: 2015-06-02 | Discharge: 2015-06-02 | Disposition: A | Discharge status: Home / Self Care | Payer: Medicare Other | Source: Ambulatory Visit | Attending: Pulmonary Disease | Admitting: Pulmonary Disease

## 2015-06-02 DIAGNOSIS — J849 Interstitial pulmonary disease, unspecified: Secondary | ICD-10-CM

## 2015-06-02 MED ORDER — MYCOPHENOLATE MOFETIL 500 MG PO TABS: ORAL_TABLET | ORAL | Status: DC

## 2015-06-02 NOTE — Telephone Encounter (Signed)
Notes Recorded by Juanito Doom, MD on 05/30/2015 at 3:17 PM A, Please let the patient know this was OK, but her renal function is not as good as it was 5 months ago. I'd like for her to discuss this with her PCP. Thanks, B ----------------------  Spoke with pt, aware of recs.  Nothing further needed at this time.

## 2015-06-10 ENCOUNTER — Telehealth: Payer: Self-pay | Admitting: Cardiology

## 2015-06-10 ENCOUNTER — Telehealth: Payer: Self-pay | Admitting: Pulmonary Disease

## 2015-06-10 NOTE — Telephone Encounter (Signed)
[name] [name] called in wanting to speak with Dr. Doug Sou nurse because the pt has recently received a new dx of pulmonary HTN and elevated capillary wedge pressure and Dr. Priscille Loveless wants the pt to be seen soon as possible. Please call her back  Thanks

## 2015-06-10 NOTE — Telephone Encounter (Signed)
Results have been explained to patient, pt expressed understanding.  Notes Recorded by Juanito Doom, MD on 06/02/2015 at 7:35 PM A Please let her know that her CT shows that her fibrosis has progressed some. We will discuss on next visit. Thanks B ----- Dr Lake Bells,  Pt states that since being off her Lasix she has noticed that her feet/ankles are swelling more but she is passing urine normally at this time.  States that she followed up with Dr Dagmar Hait to discuss results and he does not seem to be too concerned with the levels/kidney function.  Pt wanted to give FYI in the meantime of the increased edema - advised to contact our office if she notices prolonged swelling or decreased urine output.  Pt aware that message will be sent to Dr Lake Bells to address when he returns to office.  ---- Patient states that she called her Cardiologist and they are unable to see her until Aug 02, 2015.  Pt asked that we call and see if there is any way they can get her in sooner based on her new diagnosis.  Pt sees Dr Peter Martinique at [place] with Rollene Fare- will get message to Malachy Mood, Dr Doug Sou nurse and call us back with response. Will await call back in meantime from Cardiology

## 2015-06-11 NOTE — Telephone Encounter (Signed)
Pt called back. Swelling has gone done. She's using bathroom much more but needs to know what she should do if it returns this weekend. Ph # [phone].

## 2015-06-11 NOTE — Telephone Encounter (Signed)
Cheryl with Dr. Doug Sou office has made an appt for the patient to see Dr. Martinique on 08/25 at 11:30 and she will call the patient and let her know.

## 2015-06-11 NOTE — Telephone Encounter (Signed)
Returned call to [name] with Dr.Maquaid's office appointment scheduled with Dr.Jordan 06/24/15 at 11:30 am.Patient was called and notified.

## 2015-06-11 NOTE — Telephone Encounter (Signed)
Ok to just use the lasix prn swelling as there is a risk of over treating if taking daily / at least do this until next ov

## 2015-06-11 NOTE — Telephone Encounter (Signed)
Patient says swelling in her feet and legs has gone down now, using the bathroom a lot more. Patient wants to know what she should do if this swelling returns during the weekend?

## 2015-06-11 NOTE — Telephone Encounter (Signed)
Called spoke with pt. Aware of recs. She verbalized understanding and needed nothing further

## 2015-06-11 NOTE — Telephone Encounter (Signed)
Called pt to follow up on Card appt. Pt aware of date/time of appt.  Will send to Dr Lake Bells to address swelling issues - patient states that it is not severe and she is still having a normal urine output. Will call on Monday to follow up with progress.

## 2015-06-24 ENCOUNTER — Encounter: Payer: Self-pay | Admitting: Cardiology

## 2015-06-24 ENCOUNTER — Ambulatory Visit (INDEPENDENT_AMBULATORY_CARE_PROVIDER_SITE_OTHER): Payer: Medicare Other | Admitting: Cardiology

## 2015-06-24 VITALS — BP 132/50 | HR 95 | Ht 60.0 in | Wt 116.1 lb

## 2015-06-24 DIAGNOSIS — I270 Primary pulmonary hypertension: Secondary | ICD-10-CM

## 2015-06-24 DIAGNOSIS — I272 Pulmonary hypertension, unspecified: Secondary | ICD-10-CM

## 2015-06-24 DIAGNOSIS — I2510 Atherosclerotic heart disease of native coronary artery without angina pectoris: Secondary | ICD-10-CM

## 2015-06-24 DIAGNOSIS — J849 Interstitial pulmonary disease, unspecified: Secondary | ICD-10-CM

## 2015-06-24 DIAGNOSIS — I70219 Atherosclerosis of native arteries of extremities with intermittent claudication, unspecified extremity: Secondary | ICD-10-CM

## 2015-06-24 DIAGNOSIS — E785 Hyperlipidemia, unspecified: Secondary | ICD-10-CM | POA: Diagnosis not present

## 2015-06-24 NOTE — Patient Instructions (Signed)
Take lasix 20 mg daily  Sodium restriction  We will request a copy of your cath from [place]  I will see you in 3 months

## 2015-06-24 NOTE — Progress Notes (Signed)
[name] [name] Date of Birth: 07/22/1946 Medical Record #[id number]  History of Present Illness: [name] [name] is seen for cardiac follow up. I saw her 2 years ago for evaluation of dyspnea.  She  has a history of hyperlipidemia and remote tobacco use. She has a history of severe peripheral arterial disease and has had multiple revascularization procedures. She had a stress Myoview at that time that was normal. 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 She has been treated with CellCept and prednisone. She is on oxygen. She underwent transplant evaluation at [facility] and was turned down due to her extensive vascular disease. As part of her transplant evaluation she had a right and left heart cath which demonstrated moderate pulmonary HTN with mean PA pressure of 41 mm Hg. PCWP was elevated at 22 mm Hg. She had a left dominant coronary circulation. One note mentions a 70% stenosis but doesn't mention which vessel.  She does complain with increased SOB since May. She notes increased swelling in her legs for the past month. Has taken lasix twice with good response. No palpitations. No dizziness. Denies chest pain.   Current Outpatient Prescriptions on File Prior to Visit  Medication Sig Dispense Refill  . acetaminophen (TYLENOL) 500 MG tablet Take 500 mg by mouth daily as needed. For headache    . aspirin EC 81 MG tablet Take 81 mg by mouth at bedtime.    . furosemide (LASIX) 20 MG tablet Take 1 tablet (20 mg total) by mouth daily. (Patient taking differently: Take 10 mg by mouth daily. ) 5 tablet 0  . mycophenolate (CELLCEPT) 500 MG tablet Take 2 tablets twice daily 120 tablet 6  . predniSONE (DELTASONE) 10 MG tablet TAKE AS DIRECTED (Patient taking differently: Take 15 mg by mouth. TAKE AS DIRECTED) 30 tablet 0  . simvastatin (ZOCOR) 40 MG tablet Take 40 mg by mouth at bedtime.     . sulfamethoxazole-trimethoprim (BACTRIM DS,SEPTRA DS) 800-160 MG per tablet Take 1 tablet  in the AM 3 days a week on M,W,F 12 tablet 1   No current facility-administered medications on file prior to visit.    Allergies  Allergen Reactions  . Morphine And Related Nausea And Vomiting    Past Medical History  Diagnosis Date  . Peripheral arterial disease   . Hyperlipidemia   . Thyroid disease     Hypothyroidism  . Hypothyroidism   . Blood transfusion     2 /12 yrs ago  . Arthritis   . Arrhythmia     when hyperthyroid  . Dyspnea on exertion 01/22/2013  . GERD (gastroesophageal reflux disease)     otc  . Carotid artery occlusion     Past Surgical History  Procedure Laterality Date  . Pr vein bypass graft,aorto-fem-pop  03/02/09    Aortobifemoral BPG, Bilateral fem- popliteal BPG  . Hand surgery      cyst removed left hand  . Appendectomy      45 yrs ago  . Femoral-popliteal bypass graft  09/11/2011    Procedure: BYPASS GRAFT FEMORAL-POPLITEAL ARTERY;  Surgeon: Rosetta Posner, MD;  Location: [place];  Service: Vascular;  Laterality: Right;  Thrombectomy right femoral popliteal bypass with intraoperative arteriogram  . Embolectomy  09/12/2011    Procedure: EMBOLECTOMY;  Surgeon: Theotis Burrow, MD;  Location: [facility] OR;  Service: Vascular;  Laterality: Right;  Embolectomy Right Femoral -Popliteal Bypass Graft, Revision of Distal Anastomosis  . Femoral-popliteal bypass graft  09/11/2011  Procedure: BYPASS GRAFT FEMORAL-POPLITEAL ARTERY;  Surgeon: Rosetta Posner, MD;  Location: [place];  Service: Vascular;  Laterality: Right;  Thrombectomy right femoral popliteal bypass with intraoperative arteriogram  . Breast surgery      duct removed left breast  . Femoral-popliteal bypass graft  01/17/2012    Procedure: BYPASS GRAFT FEMORAL-POPLITEAL ARTERY;  Surgeon: Rosetta Posner, MD;  Location: [facility] OR;  Service: Vascular;  Laterality: Right;  right femoral to below knee popliteal bypass graft with vein  . Intraoperative arteriogram  01/17/2012    Procedure: INTRA OPERATIVE ARTERIOGRAM;   Surgeon: Rosetta Posner, MD;  Location: [facility] OR;  Service: Vascular;  Laterality: Right;  . Abdominal hysterectomy    . Femoral-popliteal bypass graft  06/17/2012    Procedure: BYPASS GRAFT FEMORAL-POPLITEAL ARTERY;  Surgeon: Rosetta Posner, MD;  Location: [facility] OR;  Service: Vascular;  Laterality: Right;  revision  . Cardiovascular stress test  May 2014  . Pulmonary stress test  July 2014  . Video assisted thoracoscopy Left 08/11/2013    Procedure: VIDEO ASSISTED THORACOSCOPY;  Surgeon: Melrose Nakayama, MD;  Location: [place];  Service: Thoracic;  Laterality: Left;  LEFT  VATS, LUNG BX  . Lung biopsy  Oct. 2014  . Cataract extraction    . Eye surgery    . Abdominal aortagram N/A 09/08/2011    Procedure: ABDOMINAL Maxcine Ham;  Surgeon: Elam Dutch, MD;  Location: [facility] CATH LAB;  Service: Cardiovascular;  Laterality: N/A;  . Lower extremity angiogram N/A 09/08/2011    Procedure: LOWER EXTREMITY ANGIOGRAM;  Surgeon: Elam Dutch, MD;  Location: [facility] CATH LAB;  Service: Cardiovascular;  Laterality: N/A;  . Abdominal aortagram N/A 05/29/2012    Procedure: ABDOMINAL AORTAGRAM;  Surgeon: Rosetta Posner, MD;  Location: [facility] CATH LAB;  Service: Cardiovascular;  Laterality: N/A;    History  Smoking status  . Former Smoker -- 1.00 packs/day for 20 years  . Types: Cigarettes  . Quit date: 09/07/1999  Smokeless tobacco  . Never Used    History  Alcohol Use  . 4.2 oz/week  . 7 Glasses of wine per week    Comment: wine-- 5 glasses a week    Family History  Problem Relation Age of Onset  . Anesthesia problems Neg Hx   . Hypotension Neg Hx   . Malignant hyperthermia Neg Hx   . Pseudochol deficiency Neg Hx   . COPD Mother   . Cancer Mother     BREAST  . Cancer Father     LUNG  . Cancer Brother     Lymphoma  . Heart attack Brother   . Lymphoma Brother   . Heart disease Brother     Heart disease before age 27  . Cancer Brother     colon   . Cancer - Colon Sister   . Cancer Sister     Colon     Review of Systems: As noted in the history of present illness.  All other systems were reviewed and are negative.  Physical Exam: BP 132/50 mmHg  Pulse 95  Ht 5' (1.524 m)  Wt 52.663 kg (116 lb 1.6 oz)  BMI 22.67 kg/m2 She is a pleasant white female in no acute distress. HEENT: Normocephalic, atraumatic. Pupils are equal round and reactive to light and accommodation. Extraocular movements are full. Dentition is in good repair. Neck is supple without JVD, adenopathy, thyromegaly, or bruits. Lungs: Clear Cardiovascular: Regular rate and rhythm, normal S1 and S2, no gallop,  murmur, or click. Abdomen: Soft and nontender. No masses or bruits. Extremities: old surgical scars in groins. Pedal pulses are 2+ and symmetric. She has no edema. Skin: Warm and dry Neuro: Alert and oriented x3. Cranial nerves II through XII are intact.  LABORATORY DATA: Lab Results  Component Value Date   WBC 13.3 Repeated and verified X2.* 05/26/2015   HGB 15.8* 05/26/2015   HCT 48.6* 05/26/2015   PLT 228.0 05/26/2015   GLUCOSE 122* 05/28/2015   ALT 23 05/26/2015   AST 23 05/26/2015   NA 137 05/28/2015   K 4.4 05/28/2015   CL 101 05/28/2015   CREATININE 1.22* 05/28/2015   BUN 15 05/28/2015   CO2 22 05/28/2015   INR 0.97 08/07/2013   Ecg today shows NSR with LAE, RAD, pulmonary disease pattern. I have personally reviewed and interpreted this study.  [facility]            [name] [name], [name] [name]                                                      [id number]  DOB: 1946-04-05                CARDIAC DIAGNOSTIC UNIT               Date: 02/12/2015 13:30:00                                                      Adult     Female Age: 4                  ECHO-DOPPLER REPORT                 Outpatient                                                      Cath Holding ---------------------------------------------------- MD1: Izora Ribas     STUDY: Chest Wall          TAPE: 0000:00:0:00:00 BP:  127/49      ECHO: Yes   DOPPLER: Yes  FILE: [id number]:   HR: 75     COLOR: Yes  CONTRAST: Yes     MACHINE: [id number] #12Height: 60 in RV BIOPSY: No         3D: No   SOUND QLTY: Moderate  Weight: 113 lbs    MEDIUM: Saline                                    BSA: 1.47,  BMI: 22.10 ------------------------------------------------------------------------------    HISTORY: pre- Lung Transplant and Cath     REASON: Assess RV function Assess LV function INDICATION: J84.89 - Other specified interstitial pulmonary diseases. Z76.82             - Awaiting organ transplant status. I73.9 - Peripheral vascular  disease, unspecified.   ECHOCARDIOGRAPHIC MEASUREMENTS ----------------------------------------------- 2D DIMENSIONS AORTA          Values   Normal Range       MAIN PA      Values   Normal Range      Annulus:  nm*  cm  [2.1 - 2.5]           PA Main:   2.1 cm  [1.5 - 2.1]    Aorta Sin:   2.0 cm  [2.7 - 3.3]        RIGHT VENTRICLE  ST Junction:  nm*  cm  [2.3 - 2.9]           RV Base:  nm*  cm  [<= 4.2]    Asc.Aorta:   2.3 cm  [2.3 - 3.1]            RV Mid:  nm*  cm  [<= 3.5] LEFT VENTRICLE                              RV Length:  nm*  cm  [<= 8.6]        LVIDd:   4.0 cm  [3.9 - 5.3]        INFERIOR VENA CAVA        LVIDs:   2.5 cm                        Max.IVC:  nm*  cm  [<= 2.1]           FS:  38%      [> 25%]               Min.IVC:  nm*  cm  [<= 1.7]          SWT:   1.1 cm  [0.6 - 1.0]        __________________          PWT:   1.0 cm  [0.6 - 1.0]        nm* - not measured LEFT ATRIUM      LA Diam:   3.2 cm  [2.7 - 3.8]      LA Area:  16   cm2 [< 20]    LA Volume:  45   mL  [22 - 52]  ECHOCARDIOGRAPHIC DESCRIPTIONS ----------------------------------------------- AORTIC ROOT         Size: Normal   Dissection: INDETERM FOR DISSECTION  AORTIC VALVE     Leaflets: Tricuspid             Morphology: Normal     Mobility: Fully Mobile  LEFT VENTRICLE                                       Anterior: Normal         Size: Normal                                 Lateral: Normal  Contraction: Normal                                  Septal: Normal   Closest EF: >55% (Estimated)  Apical: Normal    LV masses: No Masses                             Inferior: Normal          LVH: None                                 Posterior: Normal Dias.FxClass: RELAXATION ABNORMALITY (GRADE 1) CORRESPONDS TO E/A REVERSAL  MITRAL VALVE     Leaflets: Normal                  Mobility: Fully mobile   Morphology: Normal  LEFT ATRIUM         Size: Normal    LA masses: No masses               HYPERLIPOMATOSIS OF IAS  MAIN PA         Size: Normal  PULMONIC VALVE   Morphology: Normal     Mobility: Fully Mobile  RIGHT VENTRICLE         Size: Normal                    Free wall: Normal  Contraction: Normal                    RV masses: No Masses        TAPSE:   1.9 cm,  Normal Range [>= 1.6 cm]  TRICUSPID VALVE     Leaflets: Normal                  Mobility: Fully mobile   Morphology: Normal  RIGHT ATRIUM         Size: Normal                     RA Other: None    RA masses: No masses      RA Note: RAA = 12cm^2  PERICARDIUM        Fluid: TRIVIAL FLUID  INFERIOR VENACAVA         Size: Normal     ABNORMAL RESPIRATORY COLLAPSE  DOPPLER ECHO and OTHER SPECIAL PROCEDURES ------------------------------------    Aortic: No AR                  No AS     Mitral: No MR                  No MS    MV Inflow E Vel.= nm* cm/s  MV Annulus E'Vel.= nm* cm/s  E/E'Ratio= nm*  Tricuspid: TRIVIAL TR             No TS            3.5 m/s peak TR vel  Pulmonary: MILD PR                No PS      Other:            SALINE MICROCAVITATIONS LATE POSITIVE AT REST  Contrast: Minimally late positive @ rest, no change after valsalva  INTERPRETATION ---------------------------------------------------------------   NORMAL LEFT VENTRICULAR SYSTOLIC FUNCTION   NORMAL LA PRESSURES WITH  DIASTOLIC DYSFUNCTION   NORMAL RIGHT VENTRICULAR SYSTOLIC FUNCTION   VALVULAR REGURGITATION: MILD PR, TRIVIAL TR   NO VALVULAR STENOSIS   TRIVIAL PERICARDIAL EFFUSION   MINIMALLY  LATE POSITIVE SALINE MICROCAVITATION STUDY AT REST AND AFTER   VALSALVA; SUGGESTS EXTRACARDIAC SHUNTING   NO PRIOR STUDY FOR COMPARISON   (Report version 3.0)                    Interpreted and Electronically signed  Perform. by: Geryl Rankins, RRT, [facility]             by: Leotis Shames, MD  Resp.Person: Geryl Rankins, RRT, [facility]             On: 02/12/2015 18:24:32   Add. Staff: Renato Gails, MD  Right heart cath 02/12/15:  Hemodynamics (mmHg) Site Systolic Diastolic EDP A V Mean Ao (asc Ao): 172 68 108 RA: 12 RV: 68 12 14  PA: 68 26 41 PCW: 22 23 22  Oximetry Site Hgb (g/dL) O2 sat (%)  FA: 14.1 97.3  PA: 14.3 70.3  PA: 14.3 72.1  Calculations O2 consumption: 164.1 ML O2/min (assumed fick) EPBF (Qep): 3.3 L/min  Mean Hgb: 14.2 g/dl Shunt ratio: 1.0 (Qp/Qs)  AV O2: 5.0 Vol% L to R shunt: 0.0 L/min (Qp-Qep)  PBF (Qp): 3.3 L/min R to L shunt: 0.0 L/min (Qs-Qep)  Cardiac output (Qs): 3.3 L/min  Cardiac Index: 2.2 L/min/m2  PVR: 5.8 wood units  SVR: 29.5 wood units   Assessment / Plan: 1. Interstitial lung disease secondary to NSIP  2. Peripheral arterial disease status post multiple revascularization procedures. Currently asymptomatic.  3. CAD. Apparent 70% stenosis noted on cath. Asymptomatic. Will request cath films from [place] to review. For now medical management with risk factor modification  4. Pulmonary HTN. Mostly related to underlying pulmonary disease. She does have evidence of early right heart failure now. Since PCWP elevated at time of cath I would recommend she take lasix 20 mg daily. Sodium restriction.   I will follow up in 3 months.

## 2015-06-28 ENCOUNTER — Other Ambulatory Visit: Payer: Self-pay | Admitting: *Deleted

## 2015-06-28 ENCOUNTER — Encounter: Payer: Self-pay | Admitting: Pulmonary Disease

## 2015-06-28 ENCOUNTER — Ambulatory Visit (INDEPENDENT_AMBULATORY_CARE_PROVIDER_SITE_OTHER): Payer: Medicare Other | Admitting: Pulmonary Disease

## 2015-06-28 VITALS — BP 126/62 | HR 96 | Ht 60.0 in | Wt 114.0 lb

## 2015-06-28 DIAGNOSIS — J849 Interstitial pulmonary disease, unspecified: Secondary | ICD-10-CM | POA: Diagnosis not present

## 2015-06-28 MED ORDER — MYCOPHENOLATE MOFETIL 500 MG PO TABS: ORAL_TABLET | ORAL | Status: DC

## 2015-06-28 MED ORDER — FUROSEMIDE 20 MG PO TABS: 20.0000 mg | ORAL_TABLET | Freq: Every day | ORAL | Status: DC

## 2015-06-28 NOTE — Assessment & Plan Note (Addendum)
As expected, her pulmonary function test from July and her CT chest from August show worsening interstitial lung disease findings. She has an inflammatory type, fibrosing NSIP which has been managed with CellCept up until this point. It's not clear to me what the next step would be in terms of treatment other than lung transplantation as she has progressed on treatment. If there were a clear underlying connective tissue disease and that might help US guide therapy but it does not seem as if that has been identified. 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 I don't think that we have a good window right now to expect treatment to be successful in terms of anti-inflammatory modalities.  Further, she was turned down for lung transplantation by [facility], it seems as if this was due to her peripheral vascular disease and perhaps reflux. She has been told here that her peripheral vascular disease is well controlled. Simon bit puzzled as to why she was turned down as she does seem like a good candidate who exercises regularly and is quite motivated and compliant with therapy.  Plan:  Refer for a second opinion on lung transplantation to the [facility] Continue CellCept, prednisone and Bactrim as prescribed Follow-up in 6 weeks  Greater than 25 minutes spent in direct consultation with patient and her daughter

## 2015-06-28 NOTE — Patient Instructions (Signed)
Keep taking her medications as you're doing Exercise regularly Keep using oxygen as you're doing We will refer you for a second opinion on lung transplantation at the [facility] We will see you back in 6-8 weeks or sooner if needed

## 2015-06-28 NOTE — Progress Notes (Signed)
Subjective:    Patient ID: [name] [name], female    DOB: Nov 19, 1945, 69 y.o.   MRN: [id number]  Synopsis: Former Patient of Dr. Gwenette Greet with fibrosing NSIP PFT's 03/2013:  No obstruction, TLC 66% pred, DLCO 29% with partial correction with Av.  CXR 11/2012: mild cm, no acute process.  Muscle pressures 04/2013 normal HRCT 04/2013:  IS/GG changes with nonspecific pattern  Autoimmune panel 06/2013: normal VATS bx 07/2013:  Not c/w UIP, most c/w fibrosing variant of NSIP.  Has scattered multinucleated giant cells. Pt denies any exposure hx, unusual pets, unusual hobbies. HP panel 08/2013:  Not overly impressive.  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  Pt has done an environmental survey at home.  02/2014:  New complaint of joint swelling and pain >> refer to rheum Amil Amen).  Cellcept started 09/15/14 She had a lung transplant evaluation at [facility] in 2016 but failed transplant evaluation due to peripheral vascular disease.   HPI Chief Complaint  Patient presents with  . Follow-up    pt c/o sob with exertion, much improved since last ov.  review CT and pft.     [name] [name] is better than last time.  She says that her breathing is still worse than three months ago, but the interval decrease has improved.  She says that in general things are stable. She denies leg pain. She was recently seen by cardiology and had a diuretic added to her treatment regimen. She continues to use and benefit from oxygen regular. She has been exercising routinely with walking as well as using her resistance bands. She denies trouble eating and/or upset stomach.  Past Medical History  Diagnosis Date  . Peripheral arterial disease   . Hyperlipidemia   . Thyroid disease     Hypothyroidism  . Hypothyroidism   . Blood transfusion     2 /12 yrs ago  . Arthritis   . Arrhythmia     when hyperthyroid  . Dyspnea on exertion 01/22/2013  . GERD (gastroesophageal reflux disease)     otc  . Carotid artery occlusion        Review of Systems  Constitutional: Negative for fever, chills and fatigue.  HENT: Negative for nosebleeds, postnasal drip and rhinorrhea.   Respiratory: Positive for cough and shortness of breath. Negative for wheezing.   Cardiovascular: Negative for chest pain, palpitations and leg swelling.       Objective:   Physical Exam Filed Vitals:   06/28/15 1547  BP: 126/62  Pulse: 96  Height: 5' (1.524 m)  Weight: 114 lb (51.71 kg)  SpO2: 94%   5 L [place]  Gen: chronically ill appearing HENT: OP clear, TM's clear, neck supple PULM: Crackles bilaterally, normal percussion CV: RRR, no mgr, trace edema GI: BS+, soft, nontender Derm: no cyanosis or rash Psyche: normal mood and affect  2016 [facility]: RA: 12 RV: 68 12 14  PA: 68 26 41 PCW: _0 Oximetry Site Hgb (g/dL) O2 sat (%)  FA: 14.1 97.3  PA: 14.3 70.3  PA: 14.3 72.1  Calculations O2 consumption: 164.1 ML O2/min (assumed fick) EPBF (Qep): 3.3 L/min  Mean Hgb: 14.2 g/dl Shunt ratio: 1.0 (Qp/Qs)  AV O2: 5.0 Vol% L to R shunt: 0.0 L/min (Qp-Qep)  PBF (Qp): 3.3 L/min R to L shunt: 0.0 L/min (Qs-Qep)  Cardiac output (Qs): 3.3 L/min  Cardiac Index: 2.2 L/min/m2  PVR: 5.8 wood units  SVR: 29.5 wood units   I first met reviewed the  records from [facility] which showed that she was turned down from lung transplantation because of her esophageal dysmotility (interpreted as moderate), her pulmonary hypertension, and her vascular disease. There was mention of coronary artery disease but I cannot find the left heart catheterization records.      Assessment & Plan:  Interstitial lung disease As expected, her pulmonary function test from July and her CT chest from August show worsening interstitial lung disease findings. She has an inflammatory type, fibrosing NSIP which has been managed with CellCept up until this point. It's not clear to me what the next step would be in terms of treatment other than lung transplantation as  she has progressed on treatment. If there were a clear underlying connective tissue disease and that might help US guide therapy but it does not seem as if that has been identified. 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 I don't think that we have a good window right now to expect treatment to be successful in terms of anti-inflammatory modalities.  Further, she was turned down for lung transplantation by [facility], it seems as if this was due to her peripheral vascular disease and perhaps reflux. She has been told here that her peripheral vascular disease is well controlled. Simon bit puzzled as to why she was turned down as she does seem like a good candidate who exercises regularly and is quite motivated and compliant with therapy.  Plan:  Refer for a second opinion on lung transplantation to the [facility] Continue CellCept, prednisone and Bactrim as prescribed Follow-up in 6 weeks  Greater than 25 minutes spent in direct consultation with patient and her daughter     Current outpatient prescriptions:  .  acetaminophen (TYLENOL) 500 MG tablet, Take 500 mg by mouth daily as needed. For headache, Disp: , Rfl:  .  aspirin EC 81 MG tablet, Take 81 mg by mouth at bedtime., Disp: , Rfl:  .  furosemide (LASIX) 20 MG tablet, Take 1 tablet (20 mg total) by mouth daily. (Patient taking differently: Take 10 mg by mouth daily. ), Disp: 5 tablet, Rfl: 0 .  levothyroxine (SYNTHROID, LEVOTHROID) 75 MCG tablet, Take 75 mcg by mouth daily before breakfast., Disp: , Rfl:  .  mycophenolate (CELLCEPT) 500 MG tablet, Take 2 tablets twice daily, Disp: 120 tablet, Rfl: 6 .  predniSONE (DELTASONE) 10 MG tablet, TAKE AS DIRECTED (Patient taking differently: Take 15 mg by mouth. TAKE AS DIRECTED), Disp: 30 tablet, Rfl: 0 .  pregabalin (LYRICA) 50 MG capsule, Take 50 mg by mouth 2 (two) times daily., Disp: , Rfl:  .  simvastatin (ZOCOR) 40 MG tablet, Take 40 mg by mouth at  bedtime. , Disp: , Rfl:  .  sulfamethoxazole-trimethoprim (BACTRIM DS,SEPTRA DS) 800-160 MG per tablet, Take 1 tablet in the AM 3 days a week on M,W,F, Disp: 12 tablet, Rfl: 1

## 2015-06-29 ENCOUNTER — Other Ambulatory Visit: Payer: Self-pay | Admitting: *Deleted

## 2015-06-29 MED ORDER — FUROSEMIDE 20 MG PO TABS: 20.0000 mg | ORAL_TABLET | Freq: Every day | ORAL | Status: DC

## 2015-07-05 ENCOUNTER — Other Ambulatory Visit: Payer: Self-pay | Admitting: Adult Health

## 2015-07-29 ENCOUNTER — Telehealth: Payer: Self-pay | Admitting: Pulmonary Disease

## 2015-07-29 NOTE — Telephone Encounter (Signed)
Will forward to Dr. McQuaid as FYI 

## 2015-07-30 ENCOUNTER — Telehealth: Payer: Self-pay | Admitting: Cardiology

## 2015-07-30 NOTE — Telephone Encounter (Signed)
Left message for patient to call.

## 2015-07-30 NOTE — Telephone Encounter (Signed)
Pt instructed to wait on medication for therapeutic response. Advised to call next week if not improved. Pt voiced understanding.  Regarding medical records, reviewed with her and on chart - looks like imaging for heart cath is not viewable and Dr. Martinique needs this information.  Message sent to Medical Records for help on this inquiry.

## 2015-07-30 NOTE — Telephone Encounter (Signed)
Pt c/o swelling: STAT is pt has developed SOB within 24 hours  1. How long have you been experiencing swelling? Increased Bilateral lower extremity Edema not responding to meds for  3-4 weeks ago   2. Where is the swelling located? Both legs   3.  Are you currently taking a "fluid pill"? Taking pills not responding   4.  Are you currently SOB? No   5.  Have you traveled recently?No  Comments : [facility] called for an appt. Placed the appt on cancellation list for sooner appt. Please call the patient back to discuss.

## 2015-07-30 NOTE — Telephone Encounter (Signed)
Pt returned call.  States swelling, slight SOB over past 3-4 weeks. "A little worse in the afternoon".  She is taking furosemide 40mg  daily AM - not 20mg  daily as previously reported.  She was also prescribed Spirinolactone 50mg  daily yesterday by other provider. These meds are updated in her chart to reflect recent changes.  States when she saw Dr. Lake Bells recently, he thought there were further issues that needed to be addressed by cardiology. Wants to see if we had requested information from Dr. Majel Homer from [facility] regarding her [place] and a consult for possible lung transplant.  Informed patient I would route to Dr. Martinique on these concerns, will see if advised to further increase lasix or wait for therapeutic response w/ spironolactone.

## 2015-07-30 NOTE — Telephone Encounter (Signed)
I would wait and see response to aldactone. We just started lasix last month. I am not aware of any new information from [facility]. Records reveiwed under Care Everywhere and last note there from April.   Peter Martinique MD, [facility]

## 2015-07-30 NOTE — Telephone Encounter (Signed)
OK 

## 2015-08-02 ENCOUNTER — Encounter: Payer: Medicare Other | Admitting: Nurse Practitioner

## 2015-08-05 ENCOUNTER — Telehealth: Payer: Self-pay | Admitting: Cardiology

## 2015-08-10 ENCOUNTER — Ambulatory Visit (INDEPENDENT_AMBULATORY_CARE_PROVIDER_SITE_OTHER): Payer: Medicare Other | Admitting: Pulmonary Disease

## 2015-08-10 ENCOUNTER — Encounter: Payer: Self-pay | Admitting: Pulmonary Disease

## 2015-08-10 ENCOUNTER — Institutional Professional Consult (permissible substitution): Payer: Medicare Other | Admitting: Pulmonary Disease

## 2015-08-10 VITALS — BP 124/72 | HR 89 | Ht 60.0 in | Wt 111.0 lb

## 2015-08-10 DIAGNOSIS — I272 Other secondary pulmonary hypertension: Secondary | ICD-10-CM | POA: Diagnosis not present

## 2015-08-10 DIAGNOSIS — Z23 Encounter for immunization: Secondary | ICD-10-CM | POA: Diagnosis not present

## 2015-08-10 DIAGNOSIS — J9611 Chronic respiratory failure with hypoxia: Secondary | ICD-10-CM | POA: Diagnosis not present

## 2015-08-10 MED ORDER — PREDNISONE 10 MG PO TABS: ORAL_TABLET | ORAL | Status: DC

## 2015-08-10 MED ORDER — MYCOPHENOLATE MOFETIL 500 MG PO TABS: 1000.0000 mg | ORAL_TABLET | Freq: Two times a day (BID) | ORAL | Status: DC

## 2015-08-10 MED ORDER — SULFAMETHOXAZOLE-TRIMETHOPRIM 800-160 MG PO TABS: 1.0000 | ORAL_TABLET | ORAL | Status: DC

## 2015-08-10 NOTE — Assessment & Plan Note (Signed)
Continue oxygen as prescribed 

## 2015-08-10 NOTE — Assessment & Plan Note (Signed)
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 In that her right heart catheterization at [facility] earlier this year showed an elevated pulmonary capillary wedge pressure in the setting of severe underlying pulmonary fibrosis. There is no clear evidence that this would respond a pulmonary vasodilator therapy, but adjusting her diuretics are important for symptomatic benefit.  Plan: Increase diuretics to 40 mg of Lasix twice a day Continue spironolactone daily Check a basic metabolic panel to ensure that her renal function and  potassium are adequate. Continue follow-up with cardiology.

## 2015-08-10 NOTE — Patient Instructions (Signed)
Try taking the prednisone 10 mg in the morning, 5 in the afternoon Take 40 mg of Lasix in the morning with the spironolactone, 6 hours later take another 40 mg of Lasix We need to check your blood work in one week to make sure your potassium level and kidney function are okay Follow-up with cardiology Keep using her oxygen as you're doing We will see you back in 2 months or sooner if needed

## 2015-08-10 NOTE — Assessment & Plan Note (Signed)
Unfortunately [name] [name] has been turned down for lung transplantation from 2 major centers ([place]). 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 I explained to her today that it's not clear to me that she will get symptomatic benefit with stronger therapy like Cytoxan. Further, the risk of side effect I think her too great to attempt this. I'm hopeful that adjusting her diuretics will improve her symptoms, but overall this situation is slowly worsening and I feel that there is very little we can do to stop the progression of her disease.  Plan: Stay as active as possible with exercise Continue prednisone 15 mg daily, CellCept 1000 mg twice a day, and preventative Bactrim as prescribed Follow-up 2 months or sooner if needed

## 2015-08-10 NOTE — Progress Notes (Signed)
Subjective:    Patient ID: [name] [name], female    DOB: Jul 04, 1946, 69 y.o.   MRN: [id number]  Synopsis: Former Patient of Dr. Gwenette Greet with fibrosing NSIP PFT's 03/2013:  No obstruction, TLC 66% pred, DLCO 29% with partial correction with Av.  CXR 11/2012: mild cm, no acute process.  Muscle pressures 04/2013 normal HRCT 04/2013:  IS/GG changes with nonspecific pattern  Autoimmune panel 06/2013: normal VATS bx 07/2013:  Not c/w UIP, most c/w fibrosing variant of NSIP.  Has scattered multinucleated giant cells. Pt denies any exposure hx, unusual pets, unusual hobbies. HP panel 08/2013:  Not overly impressive.  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  Pt has done an environmental survey at home.  02/2014:  New complaint of joint swelling and pain >> refer to rheum Amil Amen).  Cellcept started 09/15/14 She had a lung transplant evaluation at [facility] in 2016 but failed transplant evaluation due to peripheral vascular disease.   HPI Chief Complaint  Patient presents with  . Follow-up    pt states she does well until about 2:00 pm, then pt begins having increased DOE, fatigue.  Pt was denied a second opinion consult at [place]. of [place].     [name] [name] returns to clinic today for evaluation of her nonspecific interstitial pneumonitis with fibrotic features. She was recently turned down for transplantation from [facility]. She says that she's trying to keep her spirits up. Her shortness of breath is manageable in the mornings but then becomes more significant in the afternoons. She continues to have significant ankle swelling but it has improved since her diuretic medications have been adjusted. She has cough on some days but most days it's not much of a problem. She does not have pain anywhere.  Past Medical History  Diagnosis Date  . Peripheral arterial disease ([place])   . Hyperlipidemia   . Thyroid disease     Hypothyroidism  . Hypothyroidism   . Blood transfusion     2 /12 yrs  ago  . Arthritis   . Arrhythmia     when hyperthyroid  . Dyspnea on exertion 01/22/2013  . GERD (gastroesophageal reflux disease)     otc  . Carotid artery occlusion       Review of Systems  Constitutional: Negative for fever, chills and fatigue.  HENT: Negative for nosebleeds, postnasal drip and rhinorrhea.   Respiratory: Positive for cough and shortness of breath. Negative for wheezing.   Cardiovascular: Negative for chest pain, palpitations and leg swelling.       Objective:   Physical Exam Filed Vitals:   08/10/15 1618  BP: 124/72  Pulse: 89  Height: 5' (1.524 m)  Weight: 111 lb (50.349 kg)  SpO2: 91%   5 L Jansen  Gen: chronically ill appearing HENT: OP clear, TM's clear, neck supple PULM: Crackles bilaterally, normal percussion CV: RRR, no mgr, trace edema in ankles bilaterally GI: BS+, soft, nontender Derm: no cyanosis or rash Psyche: normal mood and affect  2016 [facility]: RA: 12 RV: 68 12 14  PA: 68 26 41 PCW: _0 Oximetry Site Hgb (g/dL) O2 sat (%)  FA: 14.1 97.3  PA: 14.3 70.3  PA: 14.3 72.1  Calculations O2 consumption: 164.1 ML O2/min (assumed fick) EPBF (Qep): 3.3 L/min  Mean Hgb: 14.2 g/dl Shunt ratio: 1.0 (Qp/Qs)  AV O2: 5.0 Vol% L to R shunt: 0.0 L/min (Qp-Qep)  PBF (Qp): 3.3 L/min R to L shunt: 0.0 L/min (Qs-Qep)  Cardiac output (Qs): 3.3 L/min  Cardiac Index: 2.2 L/min/m2  PVR: 5.8 wood units  SVR: 29.5 wood units   I first met reviewed the records from [place] which showed that she was turned down from lung transplantation because of her esophageal dysmotility (interpreted as moderate), her pulmonary hypertension, and her vascular disease. There was mention of coronary artery disease but I cannot find the left heart catheterization records.      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 In that her right heart catheterization at [facility] earlier this year showed an elevated  pulmonary capillary wedge pressure in the setting of severe underlying pulmonary fibrosis. There is no clear evidence that this would respond a pulmonary vasodilator therapy, but adjusting her diuretics are important for symptomatic benefit.  Plan: Increase diuretics to 40 mg of Lasix twice a day Continue spironolactone daily Check a basic metabolic panel to ensure that her renal function and  potassium are adequate. Continue follow-up with cardiology.  Interstitial lung disease Unfortunately [name] [name] has been turned down for lung transplantation from 2 major centers ([place]). 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 I explained to her today that it's not clear to me that she will get symptomatic benefit with stronger therapy like Cytoxan. Further, the risk of side effect I think her too great to attempt this. I'm hopeful that adjusting her diuretics will improve her symptoms, but overall this situation is slowly worsening and I feel that there is very little we can do to stop the progression of her disease.  Plan: Stay as active as possible with exercise Continue prednisone 15 mg daily, CellCept 1000 mg twice a day, and preventative Bactrim as prescribed Follow-up 2 months or sooner if needed  Chronic hypoxemic respiratory failure (HCC) Continue oxygen as prescribed.  > 25 minutes spent in direct consultation   Current outpatient prescriptions:  .  acetaminophen (TYLENOL) 500 MG tablet, Take 500 mg by mouth daily as needed. For headache, Disp: , Rfl:  .  aspirin EC 81 MG tablet, Take 81 mg by mouth at bedtime., Disp: , Rfl:  .  furosemide (LASIX) 20 MG tablet, Take 1 tablet (20 mg total) by mouth daily. (Patient taking differently: Take 40 mg by mouth daily. ), Disp: 30 tablet, Rfl: 3 .  levothyroxine (SYNTHROID, LEVOTHROID) 75 MCG tablet, Take 75 mcg by mouth daily before breakfast., Disp: , Rfl:  .   pregabalin (LYRICA) 50 MG capsule, Take 50 mg by mouth 2 (two) times daily., Disp: , Rfl:  .  simvastatin (ZOCOR) 40 MG tablet, Take 40 mg by mouth at bedtime. , Disp: , Rfl:  .  spironolactone (ALDACTONE) 50 MG tablet, Take 50 mg by mouth daily., Disp: , Rfl:  .  mycophenolate (CELLCEPT) 500 MG tablet, Take 2 tablets (1,000 mg total) by mouth 2 (two) times daily., Disp: 120 tablet, Rfl: 5 .  predniSONE (DELTASONE) 10 MG tablet, TAKE AS DIRECTED (Patient not taking: Reported on 08/10/2015), Disp: 30 tablet, Rfl: 0 .  sulfamethoxazole-trimethoprim (BACTRIM DS,SEPTRA DS) 800-160 MG per tablet, TAKE 1 TABLET IN THE AM 3 DAYS A WEEK ON M,W,F (Patient not taking: Reported on 08/10/2015), Disp: 12 tablet, Rfl: 1

## 2015-08-18 ENCOUNTER — Telehealth: Payer: Self-pay | Admitting: Cardiology

## 2015-08-18 NOTE — Telephone Encounter (Signed)
Re Faxed request to obtain CD of Cath Report dated 02/12/15 from [facility].  We have written report but Dr Martinique wants to obtain CD Images. lp

## 2015-08-20 ENCOUNTER — Other Ambulatory Visit (INDEPENDENT_AMBULATORY_CARE_PROVIDER_SITE_OTHER): Payer: Medicare Other

## 2015-08-20 DIAGNOSIS — I272 Other secondary pulmonary hypertension: Secondary | ICD-10-CM

## 2015-08-20 LAB — BASIC METABOLIC PANEL
BUN: 22 mg/dL (ref 6–23)
CHLORIDE: 92 meq/L — AB (ref 96–112)
CO2: 29 mEq/L (ref 19–32)
CREATININE: 1.16 mg/dL (ref 0.40–1.20)
Calcium: 9.4 mg/dL (ref 8.4–10.5)
GFR: 49.21 mL/min — ABNORMAL LOW (ref >60.00)
GLUCOSE: 124 mg/dL — AB (ref 70–99)
Potassium: 4.1 mEq/L (ref 3.5–5.1)
Sodium: 134 mEq/L — ABNORMAL LOW (ref 135–145)

## 2015-09-27 ENCOUNTER — Telehealth: Payer: Self-pay | Admitting: Pulmonary Disease

## 2015-09-27 MED ORDER — FUROSEMIDE 20 MG PO TABS: ORAL_TABLET | ORAL | Status: DC

## 2015-09-27 NOTE — Telephone Encounter (Signed)
Per BQ: Take 40 mg of Lasix in the morning with the spironolactone, 6 hours later take another 40 mg of Lasix  Patient requesting refill of Lasix.  Refill sent to pharmacy. Patient notified. Nothing further needed..Closing encounter

## 2015-10-08 ENCOUNTER — Ambulatory Visit (INDEPENDENT_AMBULATORY_CARE_PROVIDER_SITE_OTHER): Payer: Medicare Other | Admitting: Cardiology

## 2015-10-08 ENCOUNTER — Encounter: Payer: Self-pay | Admitting: Cardiology

## 2015-10-08 VITALS — BP 98/52 | HR 95 | Ht 60.0 in | Wt 104.4 lb

## 2015-10-08 DIAGNOSIS — I739 Peripheral vascular disease, unspecified: Secondary | ICD-10-CM | POA: Diagnosis not present

## 2015-10-08 DIAGNOSIS — J849 Interstitial pulmonary disease, unspecified: Secondary | ICD-10-CM

## 2015-10-08 DIAGNOSIS — I2510 Atherosclerotic heart disease of native coronary artery without angina pectoris: Secondary | ICD-10-CM | POA: Diagnosis not present

## 2015-10-08 NOTE — Patient Instructions (Signed)
Continue your current therapy  I will see you in 6 months.   

## 2015-10-08 NOTE — Progress Notes (Signed)
[name] [name] Date of Birth: Jul 29, 1946 Medical Record [id number]  History of Present Illness: [name] [name] is seen for cardiac follow up. She  has a history of hyperlipidemia and remote tobacco use. She has a history of severe peripheral arterial disease and has had multiple revascularization procedures. She had a stress Myoview at that time that was normal. 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 She has been treated with CellCept and prednisone. She is on oxygen. She underwent transplant evaluation at [facility] and  was turned down due to her extensive vascular disease. As part of her transplant evaluation she had a right and left heart cath at [facility] which demonstrated moderate pulmonary HTN with mean PA pressure of 41 mm Hg. PCWP was elevated at 22 mm Hg. She had a left dominant coronary circulation. She had a 70% lesion in the first OM but otherwise no significant CAD.  On follow up today she complains of chronic SOB. She is on oxygen. Complains of increased edema despite the fact that diuretics have been increased. She has occasional sharp stabbing pain in her sternum. This may occur anytime and is not associated with activity.   Current Outpatient Prescriptions on File Prior to Visit  Medication Sig Dispense Refill  . acetaminophen (TYLENOL) 500 MG tablet Take 500 mg by mouth daily as needed. For headache    . aspirin EC 81 MG tablet Take 81 mg by mouth at bedtime.    . furosemide (LASIX) 20 MG tablet Take 2 tablets in the morning and 2 in the afternoon. 120 tablet 0  . levothyroxine (SYNTHROID, LEVOTHROID) 75 MCG tablet Take 75 mcg by mouth daily before breakfast.    . mycophenolate (CELLCEPT) 500 MG tablet Take 2 tablets (1,000 mg total) by mouth 2 (two) times daily. 120 tablet 5  . predniSONE (DELTASONE) 10 MG tablet TAKE AS DIRECTED 45 tablet 5  . pregabalin (LYRICA) 50 MG capsule Take 50 mg by mouth 2 (two) times daily.    . simvastatin  (ZOCOR) 40 MG tablet Take 40 mg by mouth at bedtime.     Marland Kitchen spironolactone (ALDACTONE) 50 MG tablet Take 50 mg by mouth daily.    Marland Kitchen sulfamethoxazole-trimethoprim (BACTRIM DS,SEPTRA DS) 800-160 MG tablet Take 1 tablet by mouth 3 (three) times a week. 12 tablet 5   No current facility-administered medications on file prior to visit.    Allergies  Allergen Reactions  . Morphine And Related Nausea And Vomiting    Past Medical History  Diagnosis Date  . Peripheral arterial disease ([place])   . Hyperlipidemia   . Thyroid disease     Hypothyroidism  . Hypothyroidism   . Blood transfusion     2 /12 yrs ago  . Arthritis   . Arrhythmia     when hyperthyroid  . Dyspnea on exertion 01/22/2013  . GERD (gastroesophageal reflux disease)     otc  . Carotid artery occlusion     Past Surgical History  Procedure Laterality Date  . Pr vein bypass graft,aorto-fem-pop  03/02/09    Aortobifemoral BPG, Bilateral fem- popliteal BPG  . Hand surgery      cyst removed left hand  . Appendectomy      45 yrs ago  . Femoral-popliteal bypass graft  09/11/2011    Procedure: BYPASS GRAFT FEMORAL-POPLITEAL ARTERY;  Surgeon: Rosetta Posner, MD;  Location: [place];  Service: Vascular;  Laterality: Right;  Thrombectomy right femoral popliteal bypass with intraoperative arteriogram  .  Embolectomy  09/12/2011    Procedure: EMBOLECTOMY;  Surgeon: Theotis Burrow, MD;  Location: [facility] OR;  Service: Vascular;  Laterality: Right;  Embolectomy Right Femoral -Popliteal Bypass Graft, Revision of Distal Anastomosis  . Femoral-popliteal bypass graft  09/11/2011    Procedure: BYPASS GRAFT FEMORAL-POPLITEAL ARTERY;  Surgeon: Rosetta Posner, MD;  Location: [place];  Service: Vascular;  Laterality: Right;  Thrombectomy right femoral popliteal bypass with intraoperative arteriogram  . Breast surgery      duct removed left breast  . Femoral-popliteal bypass graft  01/17/2012    Procedure: BYPASS GRAFT FEMORAL-POPLITEAL ARTERY;  Surgeon: Rosetta Posner, MD;  Location: [facility] OR;  Service: Vascular;  Laterality: Right;  right femoral to below knee popliteal bypass graft with vein  . Intraoperative arteriogram  01/17/2012    Procedure: INTRA OPERATIVE ARTERIOGRAM;  Surgeon: Rosetta Posner, MD;  Location: [facility] OR;  Service: Vascular;  Laterality: Right;  . Abdominal hysterectomy    . Femoral-popliteal bypass graft  06/17/2012    Procedure: BYPASS GRAFT FEMORAL-POPLITEAL ARTERY;  Surgeon: Rosetta Posner, MD;  Location: [facility] OR;  Service: Vascular;  Laterality: Right;  revision  . Cardiovascular stress test  May 2014  . Pulmonary stress test  July 2014  . Video assisted thoracoscopy Left 08/11/2013    Procedure: VIDEO ASSISTED THORACOSCOPY;  Surgeon: Melrose Nakayama, MD;  Location: [place];  Service: Thoracic;  Laterality: Left;  LEFT  VATS, LUNG BX  . Lung biopsy  Oct. 2014  . Cataract extraction    . Eye surgery    . Abdominal aortagram N/A 09/08/2011    Procedure: ABDOMINAL Maxcine Ham;  Surgeon: Elam Dutch, MD;  Location: [facility] CATH LAB;  Service: Cardiovascular;  Laterality: N/A;  . Lower extremity angiogram N/A 09/08/2011    Procedure: LOWER EXTREMITY ANGIOGRAM;  Surgeon: Elam Dutch, MD;  Location: [facility] CATH LAB;  Service: Cardiovascular;  Laterality: N/A;  . Abdominal aortagram N/A 05/29/2012    Procedure: ABDOMINAL AORTAGRAM;  Surgeon: Rosetta Posner, MD;  Location: [facility] CATH LAB;  Service: Cardiovascular;  Laterality: N/A;    History  Smoking status  . Former Smoker -- 1.00 packs/day for 20 years  . Types: Cigarettes  . Quit date: 09/07/1999  Smokeless tobacco  . Never Used    History  Alcohol Use  . 4.2 oz/week  . 7 Glasses of wine per week    Comment: wine-- 5 glasses a week    Family History  Problem Relation Age of Onset  . Anesthesia problems Neg Hx   . Hypotension Neg Hx   . Malignant hyperthermia Neg Hx   . Pseudochol deficiency Neg Hx   . COPD Mother   . Cancer Mother     BREAST  . Cancer Father     LUNG  .  Cancer Brother     Lymphoma  . Heart attack Brother   . Lymphoma Brother   . Heart disease Brother     Heart disease before age 17  . Cancer Brother     colon   . Cancer - Colon Sister   . Cancer Sister     Colon    Review of Systems: As noted in the history of present illness.  All other systems were reviewed and are negative.  Physical Exam: BP 98/52 mmHg  Pulse 95  Ht 5' (1.524 m)  Wt 47.373 kg (104 lb 7 oz)  BMI 20.40 kg/m2 She is a pleasant thin white female in no  acute distress. HEENT: Normocephalic, atraumatic. Pupils are equal round and reactive to light and accommodation. Extraocular movements are full. Dentition is in good repair. Neck is supple with mild JVD Lungs: Clear Cardiovascular: Regular rate and rhythm, normal S1 and S2, no gallop, murmur, or click. Abdomen: Soft and nontender. No masses or bruits. Extremities: old surgical scars in groins. Pedal pulses are 2+ and symmetric. She has 2+ edema. Skin: Warm and dry Neuro: Alert and oriented x3. Cranial nerves II through XII are intact.  LABORATORY DATA: Lab Results  Component Value Date   WBC 13.3 Repeated and verified X2.* 05/26/2015   HGB 15.8* 05/26/2015   HCT 48.6* 05/26/2015   PLT 228.0 05/26/2015   GLUCOSE 124* 08/20/2015   ALT 23 05/26/2015   AST 23 05/26/2015   NA 134* 08/20/2015   K 4.1 08/20/2015   CL 92* 08/20/2015   CREATININE 1.16 08/20/2015   BUN 22 08/20/2015   CO2 29 08/20/2015   INR 0.97 08/07/2013   Ecg today shows NSR with biatrial enlargement, RAD, incomplete RBBB, pulmonary disease pattern. I have personally reviewed and interpreted this study.  [facility]            Franny, Debaker                                                      [id number]  DOB: 1946/05/29                CARDIAC DIAGNOSTIC UNIT               Date: 02/12/2015 13:30:00                                                      Adult     Female Age: 69                  ECHO-DOPPLER REPORT                  Outpatient                                                      Cath Holding ---------------------------------------------------- MD1: Izora Ribas     STUDY: Chest Wall          TAPE: 0000:00:0:00:00 BP: 127/49      ECHO: Yes   DOPPLER: Yes  FILE: [phone]:   HR: 75     COLOR: Yes  CONTRAST: Yes     MACHINE: [id number] #12Height: 60 in RV BIOPSY: No         3D: No   SOUND QLTY: Moderate  Weight: 113 lbs    MEDIUM: Saline                                    BSA: 1.47,  BMI: 22.10 ------------------------------------------------------------------------------    HISTORY: pre- Lung Transplant and Cath     REASON: Assess RV  function Assess LV function INDICATION: J84.89 - Other specified interstitial pulmonary diseases. Z76.82             - Awaiting organ transplant status. I73.9 - Peripheral vascular             disease, unspecified.   ECHOCARDIOGRAPHIC MEASUREMENTS ----------------------------------------------- 2D DIMENSIONS AORTA          Values   Normal Range       MAIN PA      Values   Normal Range      Annulus:  nm*  cm  [2.1 - 2.5]           PA Main:   2.1 cm  [1.5 - 2.1]    Aorta Sin:   2.0 cm  [2.7 - 3.3]        RIGHT VENTRICLE  ST Junction:  nm*  cm  [2.3 - 2.9]           RV Base:  nm*  cm  [<= 4.2]    Asc.Aorta:   2.3 cm  [2.3 - 3.1]            RV Mid:  nm*  cm  [<= 3.5] LEFT VENTRICLE                              RV Length:  nm*  cm  [<= 8.6]        LVIDd:   4.0 cm  [3.9 - 5.3]        INFERIOR VENA CAVA        LVIDs:   2.5 cm                        Max.IVC:  nm*  cm  [<= 2.1]           FS:  38%      [> 25%]               Min.IVC:  nm*  cm  [<= 1.7]          SWT:   1.1 cm  [0.6 - 1.0]        __________________          PWT:   1.0 cm  [0.6 - 1.0]        nm* - not measured LEFT ATRIUM      LA Diam:   3.2 cm  [2.7 - 3.8]      LA Area:  16   cm2 [< 20]    LA Volume:  45   mL  [22 - 52]  ECHOCARDIOGRAPHIC DESCRIPTIONS ----------------------------------------------- AORTIC  ROOT         Size: Normal   Dissection: INDETERM FOR DISSECTION  AORTIC VALVE     Leaflets: Tricuspid             Morphology: Normal     Mobility: Fully Mobile  LEFT VENTRICLE                                      Anterior: Normal         Size: Normal                                 Lateral: Normal  Contraction: Normal  Septal: Normal   Closest EF: >55% (Estimated)                        Apical: Normal    LV masses: No Masses                             Inferior: Normal          LVH: None                                 Posterior: Normal Dias.FxClass: RELAXATION ABNORMALITY (GRADE 1) CORRESPONDS TO E/A REVERSAL  MITRAL VALVE     Leaflets: Normal                  Mobility: Fully mobile   Morphology: Normal  LEFT ATRIUM         Size: Normal    LA masses: No masses               HYPERLIPOMATOSIS OF IAS  MAIN PA         Size: Normal  PULMONIC VALVE   Morphology: Normal     Mobility: Fully Mobile  RIGHT VENTRICLE         Size: Normal                    Free wall: Normal  Contraction: Normal                    RV masses: No Masses        TAPSE:   1.9 cm,  Normal Range [>= 1.6 cm]  TRICUSPID VALVE     Leaflets: Normal                  Mobility: Fully mobile   Morphology: Normal  RIGHT ATRIUM         Size: Normal                     RA Other: None    RA masses: No masses      RA Note: RAA = 12cm^2  PERICARDIUM        Fluid: TRIVIAL FLUID  INFERIOR VENACAVA         Size: Normal     ABNORMAL RESPIRATORY COLLAPSE  DOPPLER ECHO and OTHER SPECIAL PROCEDURES ------------------------------------    Aortic: No AR                  No AS     Mitral: No MR                  No MS    MV Inflow E Vel.= nm* cm/s  MV Annulus E'Vel.= nm* cm/s  E/E'Ratio= nm*  Tricuspid: TRIVIAL TR             No TS            3.5 m/s peak TR vel  Pulmonary: MILD PR                No PS      Other:            SALINE MICROCAVITATIONS LATE POSITIVE AT REST   Contrast: Minimally late positive @ rest, no change after valsalva  INTERPRETATION ---------------------------------------------------------------   NORMAL LEFT VENTRICULAR SYSTOLIC FUNCTION   NORMAL LA PRESSURES  WITH DIASTOLIC DYSFUNCTION   NORMAL RIGHT VENTRICULAR SYSTOLIC FUNCTION   VALVULAR REGURGITATION: MILD PR, TRIVIAL TR   NO VALVULAR STENOSIS   TRIVIAL PERICARDIAL EFFUSION   MINIMALLY LATE POSITIVE SALINE MICROCAVITATION STUDY AT REST AND AFTER   VALSALVA; SUGGESTS EXTRACARDIAC SHUNTING   NO PRIOR STUDY FOR COMPARISON   (Report version 3.0)                    Interpreted and Electronically signed  Perform. by: Geryl Rankins, RRT, [facility]             by: Leotis Shames, MD  Resp.Person: Geryl Rankins, RRT, [facility]             On: 02/12/2015 18:24:32   Add. Staff: Renato Gails, MD  Right heart cath 02/12/15:  Hemodynamics (mmHg) Site Systolic Diastolic EDP A V Mean Ao (asc Ao): 172 68 108 RA: 12 RV: 68 12 14  PA: 68 26 41 PCW: 22 23 22  Oximetry Site Hgb (g/dL) O2 sat (%)  FA: 14.1 97.3  PA: 14.3 70.3  PA: 14.3 72.1  Calculations O2 consumption: 164.1 ML O2/min (assumed fick) EPBF (Qep): 3.3 L/min  Mean Hgb: 14.2 g/dl Shunt ratio: 1.0 (Qp/Qs)  AV O2: 5.0 Vol% L to R shunt: 0.0 L/min (Qp-Qep)  PBF (Qp): 3.3 L/min R to L shunt: 0.0 L/min (Qs-Qep)  Cardiac output (Qs): 3.3 L/min  Cardiac Index: 2.2 L/min/m2  PVR: 5.8 wood units  SVR: 29.5 wood units   Assessment / Plan: 1. Interstitial lung disease secondary to NSIP- progressive  2. Peripheral arterial disease status post multiple revascularization procedures. Currently asymptomatic.  3. CAD. 70% OM lesion. Otherwise no obstructive CAD. Continue  medical management with risk factor modification  4. Pulmonary HTN. Related to underlying pulmonary disease. She does have evidence of  right heart failure now. Poor response to diuretics but therapy limited by low BP. Follow up with Dr. [place] Bells on Monday. Not much else to  offer.   I will follow up in 6 months.

## 2015-10-11 ENCOUNTER — Telehealth: Payer: Self-pay | Admitting: Pulmonary Disease

## 2015-10-11 ENCOUNTER — Encounter: Payer: Self-pay | Admitting: Pulmonary Disease

## 2015-10-11 ENCOUNTER — Ambulatory Visit (INDEPENDENT_AMBULATORY_CARE_PROVIDER_SITE_OTHER): Payer: Medicare Other | Admitting: Pulmonary Disease

## 2015-10-11 VITALS — BP 114/62 | HR 101 | Ht 60.0 in | Wt 106.0 lb

## 2015-10-11 DIAGNOSIS — J849 Interstitial pulmonary disease, unspecified: Secondary | ICD-10-CM

## 2015-10-11 DIAGNOSIS — I272 Other secondary pulmonary hypertension: Secondary | ICD-10-CM

## 2015-10-11 DIAGNOSIS — J9611 Chronic respiratory failure with hypoxia: Secondary | ICD-10-CM | POA: Diagnosis not present

## 2015-10-11 MED ORDER — METOLAZONE 2.5 MG PO TABS: 2.5000 mg | ORAL_TABLET | Freq: Every day | ORAL | Status: DC

## 2015-10-11 NOTE — Telephone Encounter (Signed)
Yes

## 2015-10-11 NOTE — Patient Instructions (Signed)
We will make a referral to hospice Stop taking spironolactone Take Zaroxolyn 30 minutes before Lasix until the ankle swelling has gone away, then use it only on days when the ankle swelling is worse We will get a blood test next week to check your electrolytes values Keep taking Lasix as you're doing Keep using oxygen as you're doing We will see you back in February or early January, over book is okay

## 2015-10-11 NOTE — Progress Notes (Signed)
Subjective:    Patient ID: [name] [name], female    DOB: 1946/08/19, 69 y.o.   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 Gwenette Greet with fibrosing NSIP PFT's 03/2013:  No obstruction, TLC 66% pred, DLCO 29% with partial correction with Av.  CXR 11/2012: mild cm, no acute process.  Muscle pressures 04/2013 normal HRCT 04/2013:  IS/GG changes with nonspecific pattern  Autoimmune panel 06/2013: normal VATS bx 07/2013:  Not c/w UIP, most c/w fibrosing variant of NSIP.  Has scattered multinucleated giant cells. Pt denies any exposure hx, unusual pets, unusual hobbies. HP panel 08/2013:  Not overly impressive.  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  Pt has done an environmental survey at home.  02/2014:  New complaint of joint swelling and pain >> refer to rheum Amil Amen).  Cellcept started 09/15/14 She had a lung transplant evaluation at [facility] in 2016 but failed transplant evaluation due to peripheral vascular disease.   HPI Chief Complaint  Patient presents with  . Follow-up    pt c/o increased sob with weather changes, also notes occas nonprod cough.     [name] [name] feels like her breathing has worsened in the last 2 months. She has been experiencing more ankle swelling for the last 4 months. Cardiology feels she has pulmonary hypertension. Taking lasix plus "a booster" but she says it's not really working. Cough isn't too bad. Dry cough.  She says that she has had two anxiety attacks since the last visit, says her anxiety is worse but oxygen will be OK during these attacks.  Past Medical History  Diagnosis Date  . Peripheral arterial disease ([place])   . Hyperlipidemia   . Thyroid disease     Hypothyroidism  . Hypothyroidism   . Blood transfusion     2 /12 yrs ago  . Arthritis   . Arrhythmia     when hyperthyroid  . Dyspnea on exertion 01/22/2013  . GERD (gastroesophageal reflux disease)     otc  . Carotid artery occlusion       Review of Systems  Constitutional: Negative  for fever, chills and fatigue.  HENT: Negative for nosebleeds, postnasal drip and rhinorrhea.   Respiratory: Positive for cough and shortness of breath. Negative for wheezing.   Cardiovascular: Negative for chest pain, palpitations and leg swelling.       Objective:   Physical Exam Filed Vitals:   10/11/15 0901  BP: 114/62  Pulse: 101  Height: 5' (1.524 m)  Weight: 106 lb (48.081 kg)  SpO2: 90%   6 L [place]  Gen: chronically ill appearing HENT: OP clear, TM's clear, neck supple PULM: Crackles bilaterally, normal percussion CV: RRR, no mgr, trace edema in ankles bilaterally GI: BS+, soft, nontender Derm: no cyanosis or rash Psyche: normal mood and affect  2016 [facility]: RA: 12 RV: 68 12 14  PA: 68 26 41 PCW: 22 23 22  Oximetry Site Hgb (g/dL) O2 sat (%)  FA: 14.1 97.3  PA: 14.3 70.3  PA: 14.3 72.1  Calculations O2 consumption: 164.1 ML O2/min (assumed fick) EPBF (Qep): 3.3 L/min  Mean Hgb: 14.2 g/dl Shunt ratio: 1.0 (Qp/Qs)  AV O2: 5.0 Vol% L to R shunt: 0.0 L/min (Qp-Qep)  PBF (Qp): 3.3 L/min R to L shunt: 0.0 L/min (Qs-Qep)  Cardiac output (Qs): 3.3 L/min  Cardiac Index: 2.2 L/min/m2  PVR: 5.8 wood units  SVR: 29.5 wood units     I reviewed the records from cardiology last week where her  diuretics were maintained    Assessment & Plan:  Pulmonary hypertension She has [employer] group 3 pulmonary hypertension. This is causing ankle swelling which is quite bothersome. Patients in this situation do not benefit from pulmonary vasodilators but they do benefit from treatment of her underlying disease as well as using diuretic therapy. Currently she is still having swelling despite her current diuretic regimen.   The family today that there is no role for pulmonary vasodilator therapy.  Plan: Stop spironolactone Start Zaroxolyn Continue Lasix Use Zaroxolyn only on days when ankle swelling is worse. Basic metabolic panel in 1 week  Interstitial lung  disease This has progressed despite the use of prednisone and CellCept. She has been turned down by 2 separate lung transplant centers. She is now becoming more symptomatic. To her and her family today that it is reasonable to make a referral to hospice. At this time I think hospice can be most helpful with dealing with her anxiety as I don't think she needs opiates for release of dyspnea yet. However, I did explain that we may get to that point in the next few weeks to months. We will continue prednisone and CellCept for now.  Chronic hypoxemic respiratory failure (HCC) Continue 6 L continuously  > 25 minutes spent in direct consultation   Current outpatient prescriptions:  .  acetaminophen (TYLENOL) 500 MG tablet, Take 500 mg by mouth daily as needed. For headache, Disp: , Rfl:  .  ALPRAZolam (XANAX) 0.25 MG tablet, Take 0.25 mg by mouth 2 (two) times daily as needed. for anxiety prn only take twice a month, Disp: , Rfl: 1 .  aspirin EC 81 MG tablet, Take 81 mg by mouth at bedtime., Disp: , Rfl:  .  furosemide (LASIX) 20 MG tablet, Take 2 tablets in the morning and 2 in the afternoon., Disp: 120 tablet, Rfl: 0 .  gabapentin (NEURONTIN) 300 MG capsule, Take 1 capsule by mouth 2 (two) times daily. Take 1 tab twice a day as needed, Disp: , Rfl: 6 .  levothyroxine (SYNTHROID, LEVOTHROID) 75 MCG tablet, Take 75 mcg by mouth daily before breakfast., Disp: , Rfl:  .  mycophenolate (CELLCEPT) 500 MG tablet, Take 2 tablets (1,000 mg total) by mouth 2 (two) times daily., Disp: 120 tablet, Rfl: 5 .  predniSONE (DELTASONE) 10 MG tablet, TAKE AS DIRECTED, Disp: 45 tablet, Rfl: 5 .  pregabalin (LYRICA) 50 MG capsule, Take 50 mg by mouth 2 (two) times daily., Disp: , Rfl:  .  simvastatin (ZOCOR) 40 MG tablet, Take 40 mg by mouth at bedtime. , Disp: , Rfl:  .  sulfamethoxazole-trimethoprim (BACTRIM DS,SEPTRA DS) 800-160 MG tablet, Take 1 tablet by mouth 3 (three) times a week., Disp: 12 tablet, Rfl: 5 .   metolazone (ZAROXOLYN) 2.5 MG tablet, Take 1 tablet (2.5 mg total) by mouth daily., Disp: 30 tablet, Rfl: 1

## 2015-10-11 NOTE — Assessment & Plan Note (Signed)
She has [place] group 3 pulmonary hypertension. This is causing ankle swelling which is quite bothersome. Patients in this situation do not benefit from pulmonary vasodilators but they do benefit from treatment of her underlying disease as well as using diuretic therapy. Currently she is still having swelling despite her current diuretic regimen.   The family today that there is no role for pulmonary vasodilator therapy.  Plan: Stop spironolactone Start Zaroxolyn Continue Lasix Use Zaroxolyn only on days when ankle swelling is worse. Basic metabolic panel in 1 week

## 2015-10-11 NOTE — Assessment & Plan Note (Signed)
Continue 6 L continuously

## 2015-10-11 NOTE — Telephone Encounter (Signed)
Spoke with [facility] with Hospice and advised that Dr Lake Bells will be attending for Hospice and will want Hospice docs to help with symptom management.

## 2015-10-11 NOTE — Telephone Encounter (Signed)
Spoke with [employer]. She needs to know if BQ will be pt's attending MD for Hospice. They also want to know if BQ wants their MD's to help with symptom management.  BQ - please advise. Thanks.

## 2015-10-11 NOTE — Assessment & Plan Note (Signed)
This has progressed despite the use of prednisone and CellCept. She has been turned down by 2 separate lung transplant centers. She is now becoming more symptomatic. To her and her family today that it is reasonable to make a referral to hospice. At this time I think hospice can be most helpful with dealing with her anxiety as I don't think she needs opiates for release of dyspnea yet. However, I did explain that we may get to that point in the next few weeks to months. We will continue prednisone and CellCept for now.

## 2015-10-19 ENCOUNTER — Encounter (HOSPITAL_COMMUNITY): Payer: Self-pay | Admitting: *Deleted

## 2015-10-19 ENCOUNTER — Telehealth: Payer: Self-pay | Admitting: Pulmonary Disease

## 2015-10-19 ENCOUNTER — Inpatient Hospital Stay (HOSPITAL_COMMUNITY)
Admission: EM | Admit: 2015-10-19 | Discharge: 2015-10-23 | DRG: 640 | Disposition: A | Discharge status: Home / Self Care | Payer: Medicare Other | Attending: Internal Medicine | Admitting: Internal Medicine

## 2015-10-19 ENCOUNTER — Other Ambulatory Visit (INDEPENDENT_AMBULATORY_CARE_PROVIDER_SITE_OTHER): Payer: Medicare Other

## 2015-10-19 DIAGNOSIS — Z9889 Other specified postprocedural states: Secondary | ICD-10-CM

## 2015-10-19 DIAGNOSIS — E878 Other disorders of electrolyte and fluid balance, not elsewhere classified: Secondary | ICD-10-CM

## 2015-10-19 DIAGNOSIS — Z87891 Personal history of nicotine dependence: Secondary | ICD-10-CM | POA: Diagnosis not present

## 2015-10-19 DIAGNOSIS — Z885 Allergy status to narcotic agent status: Secondary | ICD-10-CM

## 2015-10-19 DIAGNOSIS — F419 Anxiety disorder, unspecified: Secondary | ICD-10-CM | POA: Diagnosis present

## 2015-10-19 DIAGNOSIS — Z8249 Family history of ischemic heart disease and other diseases of the circulatory system: Secondary | ICD-10-CM | POA: Diagnosis not present

## 2015-10-19 DIAGNOSIS — I509 Heart failure, unspecified: Secondary | ICD-10-CM

## 2015-10-19 DIAGNOSIS — I272 Other secondary pulmonary hypertension: Secondary | ICD-10-CM | POA: Diagnosis present

## 2015-10-19 DIAGNOSIS — T502X5A Adverse effect of carbonic-anhydrase inhibitors, benzothiadiazides and other diuretics, initial encounter: Secondary | ICD-10-CM | POA: Diagnosis present

## 2015-10-19 DIAGNOSIS — Z825 Family history of asthma and other chronic lower respiratory diseases: Secondary | ICD-10-CM | POA: Diagnosis not present

## 2015-10-19 DIAGNOSIS — I5033 Acute on chronic diastolic (congestive) heart failure: Secondary | ICD-10-CM | POA: Diagnosis present

## 2015-10-19 DIAGNOSIS — E876 Hypokalemia: Secondary | ICD-10-CM | POA: Diagnosis present

## 2015-10-19 DIAGNOSIS — Z9049 Acquired absence of other specified parts of digestive tract: Secondary | ICD-10-CM | POA: Diagnosis not present

## 2015-10-19 DIAGNOSIS — E871 Hypo-osmolality and hyponatremia: Secondary | ICD-10-CM | POA: Diagnosis not present

## 2015-10-19 DIAGNOSIS — Z7982 Long term (current) use of aspirin: Secondary | ICD-10-CM

## 2015-10-19 DIAGNOSIS — J849 Interstitial pulmonary disease, unspecified: Secondary | ICD-10-CM | POA: Diagnosis not present

## 2015-10-19 DIAGNOSIS — K219 Gastro-esophageal reflux disease without esophagitis: Secondary | ICD-10-CM | POA: Diagnosis present

## 2015-10-19 DIAGNOSIS — Z79899 Other long term (current) drug therapy: Secondary | ICD-10-CM

## 2015-10-19 DIAGNOSIS — G4700 Insomnia, unspecified: Secondary | ICD-10-CM | POA: Diagnosis present

## 2015-10-19 DIAGNOSIS — Z9849 Cataract extraction status, unspecified eye: Secondary | ICD-10-CM | POA: Diagnosis not present

## 2015-10-19 DIAGNOSIS — Z803 Family history of malignant neoplasm of breast: Secondary | ICD-10-CM

## 2015-10-19 DIAGNOSIS — Z800 Family history of malignant neoplasm of digestive organs: Secondary | ICD-10-CM | POA: Diagnosis not present

## 2015-10-19 DIAGNOSIS — J9611 Chronic respiratory failure with hypoxia: Secondary | ICD-10-CM | POA: Diagnosis not present

## 2015-10-19 DIAGNOSIS — E860 Dehydration: Secondary | ICD-10-CM | POA: Diagnosis present

## 2015-10-19 DIAGNOSIS — Z807 Family history of other malignant neoplasms of lymphoid, hematopoietic and related tissues: Secondary | ICD-10-CM

## 2015-10-19 DIAGNOSIS — M1990 Unspecified osteoarthritis, unspecified site: Secondary | ICD-10-CM | POA: Diagnosis present

## 2015-10-19 DIAGNOSIS — Z9981 Dependence on supplemental oxygen: Secondary | ICD-10-CM

## 2015-10-19 LAB — BASIC METABOLIC PANEL
ANION GAP: 16 — AB (ref 5–15)
BUN: 26 mg/dL — ABNORMAL HIGH (ref 6–23)
BUN: 24 mg/dL — ABNORMAL HIGH (ref 6–20)
CHLORIDE: 64 meq/L — AB (ref 96–112)
CHLORIDE: 65 mmol/L — AB (ref 101–111)
CO2: 29 meq/L (ref 19–32)
CO2: 28 mmol/L (ref 22–32)
Calcium: 9.3 mg/dL (ref 8.4–10.5)
Calcium: 8.5 mg/dL — ABNORMAL LOW (ref 8.9–10.3)
Creatinine, Ser: 1.1 mg/dL (ref 0.40–1.20)
Creatinine, Ser: 1.05 mg/dL — ABNORMAL HIGH (ref 0.44–1.00)
GFR calc non Af Amer: 53 mL/min — ABNORMAL LOW (ref >60)
GFR: 52.3 mL/min — ABNORMAL LOW (ref >60.00)
Glucose, Bld: 121 mg/dL — ABNORMAL HIGH (ref 70–99)
Glucose, Bld: 193 mg/dL — ABNORMAL HIGH (ref 65–99)
POTASSIUM: 3.2 meq/L — AB (ref 3.5–5.1)
Potassium: 2.9 mmol/L — ABNORMAL LOW (ref 3.5–5.1)
SODIUM: 112 meq/L — AB (ref 135–145)
Sodium: 109 mmol/L — CL (ref 135–145)

## 2015-10-19 LAB — URINALYSIS, ROUTINE W REFLEX MICROSCOPIC
BILIRUBIN URINE: NEGATIVE
Glucose, UA: NEGATIVE mg/dL
KETONES UR: NEGATIVE mg/dL
Leukocytes, UA: NEGATIVE
Nitrite: NEGATIVE
Protein, ur: 100 mg/dL — AB
Specific Gravity, Urine: 1.011 (ref 1.005–1.030)
pH: 6.5 (ref 5.0–8.0)

## 2015-10-19 LAB — I-STAT CHEM 8, ED
BUN: 26 mg/dL — AB (ref 6–20)
CHLORIDE: 67 mmol/L — AB (ref 101–111)
Calcium, Ion: 0.82 mmol/L — ABNORMAL LOW (ref 1.13–1.30)
Creatinine, Ser: 1.1 mg/dL — ABNORMAL HIGH (ref 0.44–1.00)
Glucose, Bld: 197 mg/dL — ABNORMAL HIGH (ref 65–99)
HEMATOCRIT: 61 % — AB (ref 36.0–46.0)
Hemoglobin: 20.7 g/dL — ABNORMAL HIGH (ref 12.0–15.0)
POTASSIUM: 2.7 mmol/L — AB (ref 3.5–5.1)
Sodium: 109 mmol/L — CL (ref 135–145)
TCO2: 34 mmol/L (ref 0–100)

## 2015-10-19 LAB — CBC WITH DIFFERENTIAL/PLATELET
Basophils Absolute: 0 10*3/uL (ref 0.0–0.1)
Basophils Relative: 0 %
Eosinophils Absolute: 0 10*3/uL (ref 0.0–0.7)
Eosinophils Relative: 0 %
HEMATOCRIT: 52.9 % — AB (ref 36.0–46.0)
Hemoglobin: 19.1 g/dL — ABNORMAL HIGH (ref 12.0–15.0)
LYMPHS PCT: 3 %
Lymphs Abs: 0.4 10*3/uL — ABNORMAL LOW (ref 0.7–4.0)
MCH: 27.9 pg (ref 26.0–34.0)
MCHC: 36.1 g/dL — ABNORMAL HIGH (ref 30.0–36.0)
MCV: 77.2 fL — AB (ref 78.0–100.0)
MONOS PCT: 5 %
Monocytes Absolute: 0.7 10*3/uL (ref 0.1–1.0)
NEUTROS ABS: 12.2 10*3/uL — AB (ref 1.7–7.7)
Neutrophils Relative %: 92 %
Platelets: 152 10*3/uL (ref 150–400)
RBC: 6.85 MIL/uL — ABNORMAL HIGH (ref 3.87–5.11)
RDW: 16.8 % — AB (ref 11.5–15.5)
WBC: 13.3 10*3/uL — AB (ref 4.0–10.5)

## 2015-10-19 LAB — URINE MICROSCOPIC-ADD ON

## 2015-10-19 LAB — OSMOLALITY, URINE: Osmolality, Ur: 265 mOsm/kg — ABNORMAL LOW (ref 300–900)

## 2015-10-19 LAB — OSMOLALITY: Osmolality: 241 mOsm/kg — CL (ref 275–295)

## 2015-10-19 LAB — CORTISOL-AM, BLOOD: Cortisol - AM: 9.3 ug/dL (ref 6.7–22.6)

## 2015-10-19 LAB — MAGNESIUM: Magnesium: 2.2 mg/dL (ref 1.7–2.4)

## 2015-10-19 LAB — TSH: TSH: 3.504 u[IU]/mL (ref 0.350–4.500)

## 2015-10-19 MED ORDER — CHLORHEXIDINE GLUCONATE 0.12 % MT SOLN
15.0000 mL | Freq: Two times a day (BID) | OROMUCOSAL | Status: DC
  Administered 2015-10-19 – 2015-10-23 (×8): 15 mL via OROMUCOSAL
  Filled 2015-10-19 (×9): qty 15

## 2015-10-19 MED ORDER — MYCOPHENOLATE MOFETIL 250 MG PO CAPS
1000.0000 mg | ORAL_CAPSULE | Freq: Two times a day (BID) | ORAL | Status: DC
  Administered 2015-10-19 – 2015-10-23 (×8): 1000 mg via ORAL
  Filled 2015-10-19 (×10): qty 4

## 2015-10-19 MED ORDER — ONDANSETRON HCL 4 MG/2ML IJ SOLN: 4.0000 mg | Freq: Four times a day (QID) | INTRAMUSCULAR | Status: DC | PRN

## 2015-10-19 MED ORDER — POTASSIUM CHLORIDE CRYS ER 20 MEQ PO TBCR
40.0000 meq | EXTENDED_RELEASE_TABLET | Freq: Once | ORAL | Status: AC
  Administered 2015-10-19: 40 meq via ORAL
  Filled 2015-10-19: qty 2

## 2015-10-19 MED ORDER — SIMVASTATIN 40 MG PO TABS
40.0000 mg | ORAL_TABLET | Freq: Every day | ORAL | Status: DC
  Administered 2015-10-19 – 2015-10-22 (×4): 40 mg via ORAL
  Filled 2015-10-19 (×5): qty 1

## 2015-10-19 MED ORDER — ACETAMINOPHEN 650 MG RE SUPP: 650.0000 mg | Freq: Four times a day (QID) | RECTAL | Status: DC | PRN

## 2015-10-19 MED ORDER — SODIUM CHLORIDE 0.9 % IV SOLN
Freq: Once | INTRAVENOUS | Status: AC
  Administered 2015-10-19: 125 mL/h via INTRAVENOUS

## 2015-10-19 MED ORDER — ACETAMINOPHEN 325 MG PO TABS
650.0000 mg | ORAL_TABLET | Freq: Four times a day (QID) | ORAL | Status: DC | PRN
Start: 2015-10-19 — End: 2015-10-23
  Administered 2015-10-22: 650 mg via ORAL
  Filled 2015-10-19: qty 2

## 2015-10-19 MED ORDER — ALPRAZOLAM 0.25 MG PO TABS
0.2500 mg | ORAL_TABLET | Freq: Three times a day (TID) | ORAL | Status: DC | PRN
  Administered 2015-10-19 – 2015-10-22 (×7): 0.25 mg via ORAL
  Filled 2015-10-19 (×7): qty 1

## 2015-10-19 MED ORDER — POTASSIUM CHLORIDE 10 MEQ/100ML IV SOLN
10.0000 meq | INTRAVENOUS | Status: AC
  Administered 2015-10-19 – 2015-10-20 (×4): 10 meq via INTRAVENOUS
  Filled 2015-10-19: qty 100

## 2015-10-19 MED ORDER — LEVOTHYROXINE SODIUM 75 MCG PO TABS
75.0000 ug | ORAL_TABLET | Freq: Every day | ORAL | Status: DC
  Administered 2015-10-20 – 2015-10-23 (×4): 75 ug via ORAL
  Filled 2015-10-19 (×6): qty 1

## 2015-10-19 MED ORDER — SODIUM CHLORIDE 0.9 % IJ SOLN
3.0000 mL | Freq: Two times a day (BID) | INTRAMUSCULAR | Status: DC
  Administered 2015-10-19 – 2015-10-22 (×4): 3 mL via INTRAVENOUS

## 2015-10-19 MED ORDER — ONDANSETRON HCL 4 MG PO TABS: 4.0000 mg | ORAL_TABLET | Freq: Four times a day (QID) | ORAL | Status: DC | PRN

## 2015-10-19 MED ORDER — ZOLPIDEM TARTRATE 5 MG PO TABS
5.0000 mg | ORAL_TABLET | Freq: Every evening | ORAL | Status: DC | PRN
  Administered 2015-10-19 – 2015-10-23 (×4): 5 mg via ORAL
  Filled 2015-10-19 (×4): qty 1

## 2015-10-19 MED ORDER — ASPIRIN EC 81 MG PO TBEC
81.0000 mg | DELAYED_RELEASE_TABLET | Freq: Every day | ORAL | Status: DC
  Administered 2015-10-19 – 2015-10-22 (×4): 81 mg via ORAL
  Filled 2015-10-19 (×5): qty 1

## 2015-10-19 MED ORDER — ENOXAPARIN SODIUM 30 MG/0.3ML ~~LOC~~ SOLN
30.0000 mg | SUBCUTANEOUS | Status: DC
  Filled 2015-10-19 (×4): qty 0.3

## 2015-10-19 MED ORDER — PREDNISONE 10 MG PO TABS
10.0000 mg | ORAL_TABLET | Freq: Every day | ORAL | Status: DC
  Administered 2015-10-20 – 2015-10-21 (×2): 10 mg via ORAL
  Filled 2015-10-19 (×3): qty 1

## 2015-10-19 MED ORDER — CETYLPYRIDINIUM CHLORIDE 0.05 % MT LIQD
7.0000 mL | Freq: Two times a day (BID) | OROMUCOSAL | Status: DC
  Administered 2015-10-20 – 2015-10-23 (×7): 7 mL via OROMUCOSAL

## 2015-10-19 MED ORDER — SODIUM CHLORIDE 0.9 % IV SOLN
INTRAVENOUS | Status: DC
  Administered 2015-10-19 – 2015-10-21 (×3): via INTRAVENOUS

## 2015-10-19 MED ORDER — ENOXAPARIN SODIUM 40 MG/0.4ML ~~LOC~~ SOLN
40.0000 mg | SUBCUTANEOUS | Status: DC
  Administered 2015-10-19: 40 mg via SUBCUTANEOUS
  Filled 2015-10-19: qty 0.4

## 2015-10-19 NOTE — ED Notes (Signed)
Notified nurse and edp results from istat chem 8

## 2015-10-19 NOTE — Telephone Encounter (Signed)
Received call from [facility] at Lab, patient has critical level of Sodium at 112  Sent message to TP in BQ's absence.

## 2015-10-19 NOTE — Telephone Encounter (Signed)
Patient advised to go to ER right now. Patient on her way to ER. Nothing further needed.

## 2015-10-19 NOTE — Telephone Encounter (Signed)
Per SN: pt has xanax 0.25 on med list. She can try increase this to 2 tabs TID PRN and forward to BQ so he is aware.  Called spoke with pt. She will try doubling up on the xanax she has and see how she does, She needed nothing further.

## 2015-10-19 NOTE — Telephone Encounter (Signed)
Per TP  Very Critical please have patient go to the ER

## 2015-10-19 NOTE — H&P (Signed)
[facility] History and Physical  [name] [name] [id number] DOB: Mar 07, 1946 DOA: 10/19/2015  Referring physician: er PCP: Tivis Ringer, MD   Chief Complaint: sent by doctor with abnormal labs  HPI: [name] [name] is a 69 y.o. female  With PMHX of ILD and pulm HTN.  She was recently seen by Dr. Lake Bells who stopped her spironolactone and started zaroxolyn.  She took 5 doses of 2.5 and stopped on Friday as her swelling had resolved.  She had a BMP done this AM and it was found to be 112.  She was sent to the ER.  Patient has been feeling ok, she was thirsty yesterday and drank 1 1/2 liters of water.  Daughter states patient was "slower" than her normal yesterday.  Swelling in legs has completely resolved. Patient's only complaint currently is anxiety--- her xanax was recently increased to TID.  She was also set to meet with hospice in the AM.  No fever, no chills, no change in her breathing.  No headaches, no seizures, no change in vision.     In the ER, her Na was 109.  She as given IVF and Kdur.      Review of Systems:  All systems reviewed, negative unless stated above    Past Medical History  Diagnosis Date  . Peripheral arterial disease ([place])   . Hyperlipidemia   . Thyroid disease     Hypothyroidism  . Hypothyroidism   . Blood transfusion     2 /12 yrs ago  . Arthritis   . Arrhythmia     when hyperthyroid  . Dyspnea on exertion 01/22/2013  . GERD (gastroesophageal reflux disease)     otc  . Carotid artery occlusion    Past Surgical History  Procedure Laterality Date  . Pr vein bypass graft,aorto-fem-pop  03/02/09    Aortobifemoral BPG, Bilateral fem- popliteal BPG  . Hand surgery      cyst removed left hand  . Appendectomy      45 yrs ago  . Femoral-popliteal bypass graft  09/11/2011    Procedure: BYPASS GRAFT FEMORAL-POPLITEAL ARTERY;  Surgeon: Rosetta Posner, MD;  Location: [place];  Service: Vascular;  Laterality: Right;  Thrombectomy right femoral  popliteal bypass with intraoperative arteriogram  . Embolectomy  09/12/2011    Procedure: EMBOLECTOMY;  Surgeon: Theotis Burrow, MD;  Location: [facility] OR;  Service: Vascular;  Laterality: Right;  Embolectomy Right Femoral -Popliteal Bypass Graft, Revision of Distal Anastomosis  . Femoral-popliteal bypass graft  09/11/2011    Procedure: BYPASS GRAFT FEMORAL-POPLITEAL ARTERY;  Surgeon: Rosetta Posner, MD;  Location: [place];  Service: Vascular;  Laterality: Right;  Thrombectomy right femoral popliteal bypass with intraoperative arteriogram  . Breast surgery      duct removed left breast  . Femoral-popliteal bypass graft  01/17/2012    Procedure: BYPASS GRAFT FEMORAL-POPLITEAL ARTERY;  Surgeon: Rosetta Posner, MD;  Location[facility] OR;  Service: Vascular;  Laterality: Right;  right femoral to below knee popliteal bypass graft with vein  . Intraoperative arteriogram  01/17/2012    Procedure: INTRA OPERATIVE ARTERIOGRAM;  Surgeon: Rosetta Posner, MD;  Location: [facility] OR;  Service: Vascular;  Laterality: Right;  . Abdominal hysterectomy    . Femoral-popliteal bypass graft  06/17/2012    Procedure: BYPASS GRAFT FEMORAL-POPLITEAL ARTERY;  Surgeon: Rosetta Posner, MD;  Location: [facility] OR;  Service: Vascular;  Laterality: Right;  revision  . Cardiovascular stress test  May 2014  . Pulmonary stress test  July 2014  . Video assisted thoracoscopy Left 08/11/2013    Procedure: VIDEO ASSISTED THORACOSCOPY;  Surgeon: Melrose Nakayama, MD;  Location: [place];  Service: Thoracic;  Laterality: Left;  LEFT  VATS, LUNG BX  . Lung biopsy  Oct. 2014  . Cataract extraction    . Eye surgery    . Abdominal aortagram N/A 09/08/2011    Procedure: ABDOMINAL Maxcine Ham;  Surgeon: Elam Dutch, MD;  Location: [facility] CATH LAB;  Service: Cardiovascular;  Laterality: N/A;  . Lower extremity angiogram N/A 09/08/2011    Procedure: LOWER EXTREMITY ANGIOGRAM;  Surgeon: Elam Dutch, MD;  Location: [facility] CATH LAB;  Service: Cardiovascular;  Laterality:  N/A;  . Abdominal aortagram N/A 05/29/2012    Procedure: ABDOMINAL AORTAGRAM;  Surgeon: Rosetta Posner, MD;  Location: [facility] CATH LAB;  Service: Cardiovascular;  Laterality: N/A;   Social History:  reports that she quit smoking about 16 years ago. Her smoking use included Cigarettes. She has a 20 pack-year smoking history. She has never used smokeless tobacco. She reports that she drinks about 4.2 oz of alcohol per week. She reports that she does not use illicit drugs.  Allergies  Allergen Reactions  . Morphine And Related Nausea And Vomiting    Family History  Problem Relation Age of Onset  . Anesthesia problems Neg Hx   . Hypotension Neg Hx   . Malignant hyperthermia Neg Hx   . Pseudochol deficiency Neg Hx   . COPD Mother   . Cancer Mother     BREAST  . Cancer Father     LUNG  . Cancer Brother     Lymphoma  . Heart attack Brother   . Lymphoma Brother   . Heart disease Brother     Heart disease before age 67  . Cancer Brother     colon   . Cancer - Colon Sister   . Cancer Sister     Colon    Prior to Admission medications   Medication Sig Start Date End Date Taking? Authorizing Provider  ALPRAZolam (XANAX) 0.25 MG tablet Take 0.25 mg by mouth 2 (two) times daily as needed. for anxiety prn only take twice a month 07/29/15  Yes Historical Provider, MD  aspirin EC 81 MG tablet Take 81 mg by mouth at bedtime.   Yes Historical Provider, MD  furosemide (LASIX) 20 MG tablet Take 2 tablets in the morning and 2 in the afternoon. 09/27/15  Yes Juanito Doom, MD  gabapentin (NEURONTIN) 300 MG capsule Take 1 capsule by mouth 2 (two) times daily. Take 1 tab twice a day as needed nerve pain. 08/31/15  Yes Historical Provider, MD  levothyroxine (SYNTHROID, LEVOTHROID) 75 MCG tablet Take 75 mcg by mouth daily before breakfast.   Yes Historical Provider, MD  metolazone (ZAROXOLYN) 2.5 MG tablet Take 1 tablet (2.5 mg total) by mouth daily. 10/11/15  Yes Juanito Doom, MD  mycophenolate  (CELLCEPT) 500 MG tablet Take 2 tablets (1,000 mg total) by mouth 2 (two) times daily. 08/10/15  Yes Juanito Doom, MD  predniSONE (DELTASONE) 10 MG tablet TAKE AS DIRECTED 08/10/15  Yes Juanito Doom, MD  simvastatin (ZOCOR) 40 MG tablet Take 40 mg by mouth at bedtime.    Yes Historical Provider, MD  sulfamethoxazole-trimethoprim (BACTRIM DS,SEPTRA DS) 800-160 MG tablet Take 1 tablet by mouth 3 (three) times a week. 08/10/15  Yes Juanito Doom, MD   Physical Exam: Filed Vitals:   10/19/15 1535 10/19/15 1549 10/19/15  1709  BP: 86/74 99/71 107/90  Pulse: 88  83  Resp: 16  12  SpO2: 93%  100%    Wt Readings from Last 3 Encounters:  10/11/15 48.081 kg (106 lb)  10/08/15 47.373 kg (104 lb 7 oz)  08/10/15 50.349 kg (111 lb)    General:  Appears calm and comfortable- 7L O2 Eyes: PERRL, red around bottom lid of eye ENT: grossly normal hearing, lips & tongue Neck: no LAD, masses or thyromegaly Cardiovascular: RRR, no m/r/g. No LE edema. Telemetry: SR, no arrhythmias  Respiratory: few crackles on inspiration, no w/r/r. Normal respiratory effort. Abdomen: soft, ntnd Skin: no rash or induration seen on limited exam Musculoskeletal: grossly normal tone BUE/BLE Psychiatric: grossly normal mood and affect, speech fluent and appropriate Neurologic: grossly non-focal.          Labs on Admission:  Basic Metabolic Panel:  Recent Labs Lab 10/19/15 0915 10/19/15 1614 10/19/15 1629  NA 112* 109* 109*  K 3.2* 2.9* 2.7*  CL 64* 65* 67*  CO2 29 28  --   GLUCOSE 121* 193* 197*  BUN 26* 24* 26*  CREATININE 1.10 1.05* 1.10*  CALCIUM 9.3 8.5*  --    Liver Function Tests: No results for input(s): AST, ALT, ALKPHOS, BILITOT, PROT, ALBUMIN in the last 168 hours. No results for input(s): LIPASE, AMYLASE in the last 168 hours. No results for input(s): AMMONIA in the last 168 hours. CBC:  Recent Labs Lab 10/19/15 1614 10/19/15 1629  WBC 13.3*  --   NEUTROABS 12.2*  --   HGB  19.1* 20.7*  HCT 52.9* 61.0*  MCV 77.2*  --   PLT 152  --    Cardiac Enzymes: No results for input(s): CKTOTAL, CKMB, CKMBINDEX, TROPONINI in the last 168 hours.  BNP (last 3 results) No results for input(s): BNP in the last 8760 hours.  ProBNP (last 3 results) No results for input(s): PROBNP in the last 8760 hours.  CBG: No results for input(s): GLUCAP in the last 168 hours.  Radiological Exams on Admission: No results found.    Assessment/Plan Active Problems:   Interstitial lung disease ([place])   Pulmonary hypertension (HCC)   Chronic hypoxemic respiratory failure (HCC)   Hyponatremia   Hypokalemia   Hyponatremia due to dehydration due to diuretics -started on zaroxolyn took 2.5 mg x 5 days and stopped as directed as her swelling resolved -still taking her lasix as well-- hold both for now - BMPq 4 hours -gentle NS at 75/hr Regular diet for now -cortisol -TSH Hypokalemia -replete -check Mg  ILD with pulm HTN -follows with pulm -stable -may want to touch base with [facility] for changes in diuretics etc  Chronic hypoxemia resp failure -wears 7L O2 at home  Insomnia -ambien ordered  Suspect in AM if Na slowly improving, patient can be tx out of SDU  Care management consult as patient was set to start on hospice in AM    Code Status: full DVT Prophylaxis: Family Communication: patient/daughter at bedside Disposition Plan:   Time spent: 75 min  [facility] Pager [phone]

## 2015-10-19 NOTE — ED Notes (Signed)
Pt wears continuous oxygen 8L [place]. Pt has been on lasix for several months, pt was but on a "booster" to help the lasix and get rid of extra fluid. Pt had follow up blood work this morning. Pt was called and told to come into ED, sodium 112. Daughter reports pt has been confused since last night. Vomited last night, vomited just now in triage room. Denies pain.

## 2015-10-19 NOTE — ED Provider Notes (Signed)
CSN: [id number]     Arrival date & time 10/19/15  1523 History   First MD Initiated Contact with Patient 10/19/15 1550     Chief Complaint  Patient presents with  . Sodium 112      (Consider location/radiation/quality/duration/timing/severity/associated sxs/prior Treatment) HPI Comments: 69yo F w/ extensive PMH including ILD on 8L home O2, PVD, HLD who p/w abnormal labs. One week ago, the patient was started on Zaroxolyn in addition to lasix for more aggressive diuresis. The patient has been taking this medication as instructed. She states that yesterday she was very thirsty and drank 2-3 L of water. This morning, she had routine follow-up lab work and was called to report to the ED because of a low sodium of 112. Daughter reports that the patient has seemed slightly off since yesterday with slowed response time but she denies any significant confusion or altered mental status. The patient states that she just doesn't feel well. She has had several episodes of vomiting today but denies any nausea currently. No abdominal pain, diarrhea, fevers, or chest pain.  The history is provided by the patient.    Past Medical History  Diagnosis Date  . Peripheral arterial disease ([place])   . Hyperlipidemia   . Thyroid disease     Hypothyroidism  . Hypothyroidism   . Blood transfusion     2 /12 yrs ago  . Arthritis   . Arrhythmia     when hyperthyroid  . Dyspnea on exertion 01/22/2013  . GERD (gastroesophageal reflux disease)     otc  . Carotid artery occlusion    Past Surgical History  Procedure Laterality Date  . Pr vein bypass graft,aorto-fem-pop  03/02/09    Aortobifemoral BPG, Bilateral fem- popliteal BPG  . Hand surgery      cyst removed left hand  . Appendectomy      45 yrs ago  . Femoral-popliteal bypass graft  09/11/2011    Procedure: BYPASS GRAFT FEMORAL-POPLITEAL ARTERY;  Surgeon: Rosetta Posner, MD;  Location: [place];  Service: Vascular;  Laterality: Right;  Thrombectomy right  femoral popliteal bypass with intraoperative arteriogram  . Embolectomy  09/12/2011    Procedure: EMBOLECTOMY;  Surgeon: Theotis Burrow, MD;  Location: [facility] OR;  Service: Vascular;  Laterality: Right;  Embolectomy Right Femoral -Popliteal Bypass Graft, Revision of Distal Anastomosis  . Femoral-popliteal bypass graft  09/11/2011    Procedure: BYPASS GRAFT FEMORAL-POPLITEAL ARTERY;  Surgeon: Rosetta Posner, MD;  Location: [place];  Service: Vascular;  Laterality: Right;  Thrombectomy right femoral popliteal bypass with intraoperative arteriogram  . Breast surgery      duct removed left breast  . Femoral-popliteal bypass graft  01/17/2012    Procedure: BYPASS GRAFT FEMORAL-POPLITEAL ARTERY;  Surgeon: Rosetta Posner, MD;  Location: [facility] OR;  Service: Vascular;  Laterality: Right;  right femoral to below knee popliteal bypass graft with vein  . Intraoperative arteriogram  01/17/2012    Procedure: INTRA OPERATIVE ARTERIOGRAM;  Surgeon: Rosetta Posner, MD;  Location: [facility] OR;  Service: Vascular;  Laterality: Right;  . Abdominal hysterectomy    . Femoral-popliteal bypass graft  06/17/2012    Procedure: BYPASS GRAFT FEMORAL-POPLITEAL ARTERY;  Surgeon: Rosetta Posner, MD;  Location: [facility] OR;  Service: Vascular;  Laterality: Right;  revision  . Cardiovascular stress test  May 2014  . Pulmonary stress test  July 2014  . Video assisted thoracoscopy Left 08/11/2013    Procedure: VIDEO ASSISTED THORACOSCOPY;  Surgeon: Melrose Nakayama, MD;  Location: [facility] OR;  Service: Thoracic;  Laterality: Left;  LEFT  VATS, LUNG BX  . Lung biopsy  Oct. 2014  . Cataract extraction    . Eye surgery    . Abdominal aortagram N/A 09/08/2011    Procedure: ABDOMINAL Maxcine Ham;  Surgeon: Elam Dutch, MD;  Location: [facility] CATH LAB;  Service: Cardiovascular;  Laterality: N/A;  . Lower extremity angiogram N/A 09/08/2011    Procedure: LOWER EXTREMITY ANGIOGRAM;  Surgeon: Elam Dutch, MD;  Location: [facility] CATH LAB;  Service: Cardiovascular;   Laterality: N/A;  . Abdominal aortagram N/A 05/29/2012    Procedure: ABDOMINAL AORTAGRAM;  Surgeon: Rosetta Posner, MD;  Location: [facility] CATH LAB;  Service: Cardiovascular;  Laterality: N/A;   Family History  Problem Relation Age of Onset  . Anesthesia problems Neg Hx   . Hypotension Neg Hx   . Malignant hyperthermia Neg Hx   . Pseudochol deficiency Neg Hx   . COPD Mother   . Cancer Mother     BREAST  . Cancer Father     LUNG  . Cancer Brother     Lymphoma  . Heart attack Brother   . Lymphoma Brother   . Heart disease Brother     Heart disease before age 45  . Cancer Brother     colon   . Cancer - Colon Sister   . Cancer Sister     Colon   Social History  Substance Use Topics  . Smoking status: Former Smoker -- 1.00 packs/day for 20 years    Types: Cigarettes    Quit date: 09/07/1999  . Smokeless tobacco: Never Used  . Alcohol Use: 4.2 oz/week    7 Glasses of wine per week     Comment: wine-- 5 glasses a week   OB History    No data available     Review of Systems 10 Systems reviewed and are negative for acute change except as noted in the HPI.    Allergies  Morphine and related  Home Medications   Prior to Admission medications   Medication Sig Start Date End Date Taking? Authorizing Provider  acetaminophen (TYLENOL) 500 MG tablet Take 500 mg by mouth daily as needed. For headache    Historical Provider, MD  ALPRAZolam (XANAX) 0.25 MG tablet Take 0.25 mg by mouth 2 (two) times daily as needed. for anxiety prn only take twice a month 07/29/15   Historical Provider, MD  aspirin EC 81 MG tablet Take 81 mg by mouth at bedtime.    Historical Provider, MD  furosemide (LASIX) 20 MG tablet Take 2 tablets in the morning and 2 in the afternoon. 09/27/15   Juanito Doom, MD  gabapentin (NEURONTIN) 300 MG capsule Take 1 capsule by mouth 2 (two) times daily. Take 1 tab twice a day as needed 08/31/15   Historical Provider, MD  levothyroxine (SYNTHROID, LEVOTHROID) 75 MCG  tablet Take 75 mcg by mouth daily before breakfast.    Historical Provider, MD  metolazone (ZAROXOLYN) 2.5 MG tablet Take 1 tablet (2.5 mg total) by mouth daily. 10/11/15   Juanito Doom, MD  mycophenolate (CELLCEPT) 500 MG tablet Take 2 tablets (1,000 mg total) by mouth 2 (two) times daily. 08/10/15   Juanito Doom, MD  predniSONE (DELTASONE) 10 MG tablet TAKE AS DIRECTED 08/10/15   Juanito Doom, MD  pregabalin (LYRICA) 50 MG capsule Take 50 mg by mouth 2 (two) times daily.    Historical Provider, MD  simvastatin (ZOCOR)  40 MG tablet Take 40 mg by mouth at bedtime.     Historical Provider, MD  sulfamethoxazole-trimethoprim (BACTRIM DS,SEPTRA DS) 800-160 MG tablet Take 1 tablet by mouth 3 (three) times a week. 08/10/15   Juanito Doom, MD   BP 99/71 mmHg  Pulse 88  Resp 16  SpO2 93% Physical Exam  Constitutional: She is oriented to person, place, and time. No distress.  Thin , frail-appearing  HENT:  Head: Normocephalic and atraumatic.  Moist mucous membranes, thrush on soft and hard palate  Eyes: Conjunctivae are normal. Pupils are equal, round, and reactive to light.  Neck: Neck supple.  Cardiovascular: Normal rate, regular rhythm and normal heart sounds.   No murmur heard. Pulmonary/Chest:  Mildly increased WOB on O2, diffuse crackles in bases b/l  Abdominal: Soft. Bowel sounds are normal. She exhibits no distension. There is no tenderness.  Musculoskeletal: She exhibits no edema.  Clubbed fingertips  Neurological: She is alert and oriented to person, place, and time. She exhibits normal muscle tone.  Fluent speech  Skin: Skin is warm and dry.  Psychiatric: She has a normal mood and affect. Judgment normal.  Nursing note and vitals reviewed.   ED Course  Procedures (including critical care time) Labs Review Labs Reviewed  BASIC METABOLIC PANEL - Abnormal; Notable for the following:    Sodium 109 (*)    Potassium 2.9 (*)    Chloride 65 (*)    Glucose, Bld  193 (*)    BUN 24 (*)    Creatinine, Ser 1.05 (*)    Calcium 8.5 (*)    GFR calc non Af Amer 53 (*)    Anion gap 16 (*)    All other components within normal limits  CBC WITH DIFFERENTIAL/PLATELET - Abnormal; Notable for the following:    WBC 13.3 (*)    RBC 6.85 (*)    Hemoglobin 19.1 (*)    HCT 52.9 (*)    MCV 77.2 (*)    MCHC 36.1 (*)    RDW 16.8 (*)    Neutro Abs 12.2 (*)    Lymphs Abs 0.4 (*)    All other components within normal limits  URINALYSIS, ROUTINE W REFLEX MICROSCOPIC (NOT AT [facility]) - Abnormal; Notable for the following:    Hgb urine dipstick TRACE (*)    Protein, ur 100 (*)    All other components within normal limits  OSMOLALITY, URINE - Abnormal; Notable for the following:    Osmolality, Ur 265 (*)    All other components within normal limits  OSMOLALITY - Abnormal; Notable for the following:    Osmolality 241 (*)    All other components within normal limits  URINE MICROSCOPIC-ADD ON - Abnormal; Notable for the following:    Squamous Epithelial / LPF 0-5 (*)    Bacteria, UA RARE (*)    All other components within normal limits  I-STAT CHEM 8, ED - Abnormal; Notable for the following:    Sodium 109 (*)    Potassium 2.7 (*)    Chloride 67 (*)    BUN 26 (*)    Creatinine, Ser 1.10 (*)    Glucose, Bld 197 (*)    Calcium, Ion 0.82 (*)    Hemoglobin 20.7 (*)    HCT 61.0 (*)    All other components within normal limits  MRSA PCR SCREENING  MAGNESIUM  TSH  CORTISOL-AM, BLOOD  CBC  BASIC METABOLIC PANEL  BASIC METABOLIC PANEL  BASIC METABOLIC PANEL  BASIC METABOLIC  PANEL    Imaging Review No results found. I have personally reviewed and evaluated these lab results as part of my medical decision-making.   EKG Interpretation None      MDM   Final diagnoses:  Hyponatremia  Hypochloremia  Interstitial lung disease ([place])   Patient with end-stage interstitial lung disease on home oxygen who presents for further evaluation after she was found to  have a sodium of 112 on routine lab work today. Patient does endorse drinking a lot of water yesterday. On exam, the patient was awake, alert, and able to answer questions appropriately. O2 sat stable on 8 L nasal cannula, normal heart rate. Patient was comfortable on exam without any complaints of pain. Thrush noted in her mouth. Obtained above labs including serum and urine osmolality. Pt does not appear severely dehydrated therefore held on fluid bolus as she may need fluid restriction.   Labs notable for elevated hemoglobin likely from hemoconcentration, sodium 109, potassium 2.9, chloride 65, creatinine 1.05. Despite patient's profound hyponatremia, she continues to [facility] appropriately and does not need hypertonic saline at this time.Started maintenance NS, gave IV and PO potassium repletion. Sent serum and urine osmolality.  Discussed w/ [facility] hospitalist, Dr. Eliseo Squires, appreciate assistance. Pt admitted to stepdown for further resuscitation.    Sharlett Iles, MD 10/20/15 [phone]

## 2015-10-19 NOTE — ED Notes (Signed)
MD at bedside. 

## 2015-10-19 NOTE — Telephone Encounter (Signed)
Patient requesting medication for anxiety.  Patient is currently taking Xanax, but it is not helping.  Patient states she is a "bundle of nerves".  Has not tried any other medications. Not able to sleep, needs medication that will help her sleep.    Pharmacy: [facility] [address]  Allergies  Allergen Reactions  . Morphine And Related Nausea And Vomiting   To SN in BQ's absence.

## 2015-10-20 DIAGNOSIS — J849 Interstitial pulmonary disease, unspecified: Secondary | ICD-10-CM

## 2015-10-20 DIAGNOSIS — I272 Other secondary pulmonary hypertension: Secondary | ICD-10-CM

## 2015-10-20 DIAGNOSIS — E871 Hypo-osmolality and hyponatremia: Principal | ICD-10-CM

## 2015-10-20 DIAGNOSIS — J9611 Chronic respiratory failure with hypoxia: Secondary | ICD-10-CM

## 2015-10-20 DIAGNOSIS — E876 Hypokalemia: Secondary | ICD-10-CM

## 2015-10-20 LAB — BASIC METABOLIC PANEL
ANION GAP: 12 (ref 5–15)
Anion gap: 16 — ABNORMAL HIGH (ref 5–15)
Anion gap: 13 (ref 5–15)
Anion gap: 12 (ref 5–15)
BUN: 19 mg/dL (ref 6–20)
BUN: 17 mg/dL (ref 6–20)
BUN: 16 mg/dL (ref 6–20)
BUN: 20 mg/dL (ref 6–20)
CALCIUM: 8.7 mg/dL — AB (ref 8.9–10.3)
CALCIUM: 8.3 mg/dL — AB (ref 8.9–10.3)
CALCIUM: 8.4 mg/dL — AB (ref 8.9–10.3)
CALCIUM: 8.7 mg/dL — AB (ref 8.9–10.3)
CO2: 27 mmol/L (ref 22–32)
CO2: 27 mmol/L (ref 22–32)
CO2: 25 mmol/L (ref 22–32)
CO2: 28 mmol/L (ref 22–32)
CREATININE: 0.89 mg/dL (ref 0.44–1.00)
CREATININE: 0.68 mg/dL (ref 0.44–1.00)
CREATININE: 0.86 mg/dL (ref 0.44–1.00)
Chloride: 73 mmol/L — ABNORMAL LOW (ref 101–111)
Chloride: 77 mmol/L — ABNORMAL LOW (ref 101–111)
Chloride: 82 mmol/L — ABNORMAL LOW (ref 101–111)
Chloride: 85 mmol/L — ABNORMAL LOW (ref 101–111)
Creatinine, Ser: 0.83 mg/dL (ref 0.44–1.00)
GLUCOSE: 96 mg/dL (ref 65–99)
GLUCOSE: 98 mg/dL (ref 65–99)
GLUCOSE: 161 mg/dL — AB (ref 65–99)
Glucose, Bld: 125 mg/dL — ABNORMAL HIGH (ref 65–99)
POTASSIUM: 3.6 mmol/L (ref 3.5–5.1)
Potassium: 4 mmol/L (ref 3.5–5.1)
Potassium: 3.6 mmol/L (ref 3.5–5.1)
Potassium: 4.1 mmol/L (ref 3.5–5.1)
SODIUM: 116 mmol/L — AB (ref 135–145)
Sodium: 116 mmol/L — CL (ref 135–145)
Sodium: 120 mmol/L — ABNORMAL LOW (ref 135–145)
Sodium: 125 mmol/L — ABNORMAL LOW (ref 135–145)

## 2015-10-20 LAB — MRSA PCR SCREENING: MRSA BY PCR: NEGATIVE

## 2015-10-20 LAB — CBC
HCT: 49.5 % — ABNORMAL HIGH (ref 36.0–46.0)
Hemoglobin: 17.5 g/dL — ABNORMAL HIGH (ref 12.0–15.0)
MCH: 27.7 pg (ref 26.0–34.0)
MCHC: 35.4 g/dL (ref 30.0–36.0)
MCV: 78.4 fL (ref 78.0–100.0)
PLATELETS: 149 10*3/uL — AB (ref 150–400)
RBC: 6.31 MIL/uL — AB (ref 3.87–5.11)
RDW: 16.8 % — AB (ref 11.5–15.5)
WBC: 10.4 10*3/uL (ref 4.0–10.5)

## 2015-10-20 LAB — SODIUM, URINE, RANDOM: SODIUM UR: 12 mmol/L

## 2015-10-20 MED ORDER — SALINE SPRAY 0.65 % NA SOLN
1.0000 | NASAL | Status: DC | PRN
  Filled 2015-10-20: qty 44

## 2015-10-20 MED ORDER — AYR SALINE NASAL NA GEL
1.0000 "application " | Freq: Four times a day (QID) | NASAL | Status: DC | PRN
  Filled 2015-10-20: qty 14.1

## 2015-10-20 MED ORDER — NYSTATIN 100000 UNIT/ML MT SUSP
5.0000 mL | Freq: Four times a day (QID) | OROMUCOSAL | Status: DC
  Administered 2015-10-20 – 2015-10-23 (×11): 500000 [IU] via ORAL
  Filled 2015-10-20 (×14): qty 5

## 2015-10-20 MED ORDER — SULFAMETHOXAZOLE-TRIMETHOPRIM 800-160 MG PO TABS
1.0000 | ORAL_TABLET | ORAL | Status: DC
  Administered 2015-10-20 – 2015-10-22 (×2): 1 via ORAL
  Filled 2015-10-20 (×2): qty 1

## 2015-10-20 MED ORDER — BOOST PLUS PO LIQD
237.0000 mL | Freq: Three times a day (TID) | ORAL | Status: DC
  Administered 2015-10-20 – 2015-10-23 (×9): 237 mL via ORAL
  Filled 2015-10-20 (×10): qty 237

## 2015-10-20 NOTE — Progress Notes (Signed)
Initial Nutrition Assessment  DOCUMENTATION CODES:   Not applicable  INTERVENTION:  -Boost Lactose Free TID, each supplement provides 360 calories and 16 grams of protein   NUTRITION DIAGNOSIS:   Inadequate oral intake related to poor appetite, chronic illness as evidenced by per patient/family report, percent weight loss.  GOAL:   Patient will meet greater than or equal to 90% of their needs  MONITOR:   Supplement acceptance, I & O's, PO intake, Labs  REASON FOR ASSESSMENT:   Malnutrition Screening Tool    ASSESSMENT:   With PMHX of ILD and pulm HTN. She was recently seen by Dr. Lake Bells who stopped her spironolactone and started zaroxolyn. She took 5 doses of 2.5 and stopped on Friday as her swelling had resolved. She had a BMP done this AM and it was found to be 112. She was sent to the ER. Patient has been feeling ok, she was thirsty yesterday and drank 1 1/2 liters of water. Daughter states patient was "slower" than her normal yesterday. Swelling in legs has completely resolved.  Spoke with pt an daughter at bedside. Pt reports good appetite up until about 3-4 days ago when her legs began swelling. She was put on lasix to be diuresed, which caused her to become hypokalemic and hyponatremic.  Per chart, pt has 26#/21% severe wt loss over 11 months. Pt stated that this wt loss was related to her intake with ILD.   She reports no other barriers to nutrition. Does not like EE, RD will provide Boost Lactose free.  Pt is set to transfer out of Stepdown when Na becomes normalized.  Current labs: Na 116, Cl 77 Medications reviewed.  Diet Order:  Diet regular Room service appropriate?: Yes; Fluid consistency:: Thin  Skin:  Reviewed, no issues  Last BM:  PTA  Height:   Ht Readings from Last 1 Encounters:  10/19/15 5' (1.524 m)    Weight:   Wt Readings from Last 1 Encounters:  10/19/15 98 lb 8.7 oz (44.7 kg)    Ideal Body Weight:  45.45 kg  BMI:  Body mass  index is 19.25 kg/(m^2).  Estimated Nutritional Needs:   Kcal:  1500-1700 calories  Protein:  50-60 grams  Fluid:  1.2L  EDUCATION NEEDS:   Education needs addressed  Satira Anis. Alanis Clift, MS, RD LDN After Hours/Weekend Pager [phone]

## 2015-10-20 NOTE — Progress Notes (Signed)
[facility] PROGRESS NOTE  [name] [name] [id number] DOB: [birth year] DOA: 10/19/2015 PCP: Tivis Ringer, MD  HPI/Brief narrative 69 y.o. female with PMHX of ILD and pulm HTN. She was recently seen by Dr. [place] [name] who stopped her spironolactone and started zaroxolyn. She took 5 doses of 2.5 and stopped on Friday as her swelling had resolved. She had a BMP done with Na of 112. She was sent to the ER  Assessment/Plan: Hyponatremia due to dehydration due to diuretics -started on zaroxolyn took 2.5 mg x 5 days and stopped as directed as her swelling resolved -still taking her lasix as well-- remain on hold for now - Sodium is improving with gentle hydration -Continue Regular diet for now  Hypokalemia -Replaced -Continue to follow  ILD with pulm HTN -follows with pulm -Currently stable on baseline home O2  Chronic hypoxemia resp failure -wears 7L O2 at home at baseline  Insomnia -ambien ordered as tolerated  Code Status: Full Family Communication: Patient in room Disposition Plan: Transfer to floor   Consultants:    Procedures:    Antibiotics: Anti-infectives    Start     Dose/Rate Route Frequency Ordered Stop   10/20/15 1800  sulfamethoxazole-trimethoprim (BACTRIM DS,SEPTRA DS) 800-160 MG per tablet 1 tablet     1 tablet Oral Once per day on Mon Wed Fri 10/20/15 1545        HPI/Subjective: Patient is without complaints today. She feels better.  Objective: Filed Vitals:   10/20/15 1200 10/20/15 1400 10/20/15 1530 10/20/15 1555  BP: 97/70 112/60 127/83   Pulse:      Temp: 97.6 F (36.4 C)   97.3 F (36.3 C)  TempSrc: Oral   Oral  Resp:  21 17   Height:      Weight:      SpO2: 100% 92% 94%     Intake/Output Summary (Last 24 hours) at 10/20/15 1625 Last data filed at 10/20/15 1400  Gross per 24 hour  Intake 2156.25 ml  Output   1500 ml  Net 656.25 ml   Filed Weights   10/19/15 1845  Weight: 44.7 kg (98 lb 8.7 oz)     Exam:   General:  Awake, in nad  Cardiovascular: regular, s1, s2  Respiratory: normal resp effort, no wheezing  Abdomen: soft,nondistended  Musculoskeletal: perfused, no clubbing   Data Reviewed: Basic Metabolic Panel:  Recent Labs Lab 10/19/15 0915 10/19/15 1614 10/19/15 1629 10/20/15 0217 10/20/15 0711 10/20/15 1241  NA 112* 109* 109* 116* 116* 120*  K 3.2* 2.9* 2.7* 4.0 3.6 3.6  CL 64* 65* 67* 73* 77* 82*  CO2 29 28  --  27 27 25   GLUCOSE 121* 193* 197* 96 98 125*  BUN 26* 24* 26* 19 17 16   CREATININE 1.10 1.05* 1.10* 0.89 0.83 0.68  CALCIUM 9.3 8.5*  --  8.7* 8.3* 8.4*  MG  --  2.2  --   --   --   --    Liver Function Tests: No results for input(s): AST, ALT, ALKPHOS, BILITOT, PROT, ALBUMIN in the last 168 hours. No results for input(s): LIPASE, AMYLASE in the last 168 hours. No results for input(s): AMMONIA in the last 168 hours. CBC:  Recent Labs Lab 10/19/15 1614 10/19/15 1629 10/20/15 0113  WBC 13.3*  --  10.4  NEUTROABS 12.2*  --   --   HGB 19.1* 20.7* 17.5*  HCT 52.9* 61.0* 49.5*  MCV 77.2*  --  78.4  PLT 152  --  149*  Cardiac Enzymes: No results for input(s): CKTOTAL, CKMB, CKMBINDEX, TROPONINI in the last 168 hours. BNP (last 3 results) No results for input(s): BNP in the last 8760 hours.  ProBNP (last 3 results) No results for input(s): PROBNP in the last 8760 hours.  CBG: No results for input(s): GLUCAP in the last 168 hours.  Recent Results (from the past 240 hour(s))  MRSA PCR Screening     Status: None   Collection Time: 10/19/15 10:26 PM  Result Value Ref Range Status   MRSA by PCR NEGATIVE NEGATIVE Final    Comment:        The GeneXpert MRSA Assay (FDA approved for NASAL specimens only), is one component of a comprehensive MRSA colonization surveillance program. It is not intended to diagnose MRSA infection nor to guide or monitor treatment for MRSA infections.      Studies: No results found.  Scheduled  Meds: . antiseptic oral rinse  7 mL Mouth Rinse q12n4p  . aspirin EC  81 mg Oral QHS  . chlorhexidine  15 mL Mouth Rinse BID  . enoxaparin (LOVENOX) injection  30 mg Subcutaneous Q24H  . lactose free nutrition  237 mL Oral TID WC  . levothyroxine  75 mcg Oral QAC breakfast  . mycophenolate  1,000 mg Oral BID  . nystatin  5 mL Oral QID  . predniSONE  10 mg Oral Q breakfast  . simvastatin  40 mg Oral QHS  . sodium chloride  3 mL Intravenous Q12H  . sulfamethoxazole-trimethoprim  1 tablet Oral Once per day on Mon Wed Fri   Continuous Infusions: . sodium chloride 75 mL/hr at 10/19/15 1955    Active Problems:   Interstitial lung disease ([place])   Pulmonary hypertension ([place])   Chronic hypoxemic respiratory failure ([place])   Hyponatremia   Hypokalemia   [name] [name], [facility] Pager [phone]. If 7PM-7AM, please contact night-coverage at [email or web address], password [facility] 10/20/2015, 4:25 PM  LOS: 1 day

## 2015-10-20 NOTE — Progress Notes (Signed)
Patient transferred to [facility] room 1514.  Received report from Fraser Din, RN in ICU.  Patient arrived alert and oriented, no acute distress.  Ambulated to bed with no shortness of breath.  Placed on 8L 02- home dose.  Oriented to unit, staff, and room.  Will continue to monitor.

## 2015-10-20 NOTE — Care Management Note (Signed)
Case Management Note  Patient Details  Name: OCTA SANTIN MRN: [id number] Date of Birth: 1945/12/23  Subjective/Objective:        hyponatremia and hypovolemia            Action/Plan:Date: October 20, 2015 Chart reviewed for concurrent status and case management needs. Will continue to follow patient for changes and needs: Velva Harman, RN, BSN, [place]   [phone]   Expected Discharge Date:   (unknown)               Expected Discharge Plan:  Home/Self Care  In-House Referral:  NA  Discharge planning Services  CM Consult  Post Acute Care Choice:  NA Choice offered to:  NA  DME Arranged:    DME Agency:     HH Arranged:    HH Agency:     Status of Service:  In process, will continue to follow  Medicare Important Message Given:    Date Medicare IM Given:    Medicare IM give by:    Date Additional Medicare IM Given:    Additional Medicare Important Message give by:     If discussed at [place] of Stay Meetings, dates discussed:    Additional Comments:  Leeroy Cha, RN 10/20/2015, 8:50 AM

## 2015-10-21 LAB — BASIC METABOLIC PANEL
Anion gap: 9 (ref 5–15)
BUN: 17 mg/dL (ref 6–20)
CALCIUM: 8.3 mg/dL — AB (ref 8.9–10.3)
CHLORIDE: 92 mmol/L — AB (ref 101–111)
CO2: 26 mmol/L (ref 22–32)
CREATININE: 0.78 mg/dL (ref 0.44–1.00)
GFR calc Af Amer: >60 mL/min (ref >60)
Glucose, Bld: 89 mg/dL (ref 65–99)
Potassium: 3.4 mmol/L — ABNORMAL LOW (ref 3.5–5.1)
SODIUM: 127 mmol/L — AB (ref 135–145)

## 2015-10-21 MED ORDER — PREDNISONE 10 MG PO TABS
10.0000 mg | ORAL_TABLET | Freq: Every day | ORAL | Status: DC
  Administered 2015-10-22 – 2015-10-23 (×2): 10 mg via ORAL
  Filled 2015-10-21 (×3): qty 1

## 2015-10-21 MED ORDER — POTASSIUM CHLORIDE CRYS ER 20 MEQ PO TBCR
40.0000 meq | EXTENDED_RELEASE_TABLET | Freq: Once | ORAL | Status: AC
  Administered 2015-10-21: 40 meq via ORAL
  Filled 2015-10-21: qty 2

## 2015-10-21 MED ORDER — PREDNISONE 5 MG PO TABS
5.0000 mg | ORAL_TABLET | Freq: Every day | ORAL | Status: DC
  Administered 2015-10-21 – 2015-10-22 (×2): 5 mg via ORAL
  Filled 2015-10-21 (×4): qty 1

## 2015-10-21 NOTE — Progress Notes (Signed)
OT Cancellation Note  Patient Details Name: [name] [name] MRN: [id number] DOB: Jan 23, 1946   Cancelled Treatment:    Reason Eval/Treat Not Completed: Other (comment).  Pt just got back to bed. Per RN, pt may leave tomorrow. Will either return later today or in am.  Lajune Perine 10/21/2015, 1:25 PM  Lesle Chris, OTR/L [phone] 10/21/2015

## 2015-10-21 NOTE — Progress Notes (Signed)
[facility] PROGRESS NOTE  [name] [name] [id number] DOB: 1946-03-30 DOA: 10/19/2015 PCP: Tivis Ringer, MD  HPI/Brief narrative 69 y.o. female with PMHX of ILD and pulm HTN. She was recently seen by Dr. Lake Bells who stopped her spironolactone and started zaroxolyn. She took 5 doses of 2.5 and stopped on Friday as her swelling had resolved. She had a BMP done with Na of 112. She was sent to the ER  Assessment/Plan: Hyponatremia due to dehydration due to diuretics -Patient had started on zaroxolyn and took 2.5 mg x 5 days and stopped as directed as her swelling resolved. Patient presented still taking her lasix as well -Diuretics currently remains on hold - Sodium is improving nicely with gentle hydration -Continue Regular diet for now  Hypokalemia -Replaced -Continue to follow and replace electrolytes as needed  ILD with pulm HTN -follows with pulm -Currently stable on baseline home O2 -Discussed with case manager. Patient is currently being set up with home hospice per her primary pulmonologist, Dr. Lake Bells  Chronic hypoxemia resp failure -wears 7L O2 at home at baseline  Insomnia -ambien ordered as tolerated  Code Status: Full Family Communication: Patient in room Disposition Plan: Transfer to floor   Consultants:    Procedures:    Antibiotics: Anti-infectives    Start     Dose/Rate Route Frequency Ordered Stop   10/20/15 1800  sulfamethoxazole-trimethoprim (BACTRIM DS,SEPTRA DS) 800-160 MG per tablet 1 tablet     1 tablet Oral Once per day on Mon Wed Fri 10/20/15 1545        HPI/Subjective: Patient reports feeling better in his ear to go home  Objective: Filed Vitals:   10/20/15 1530 10/20/15 1555 10/20/15 1901 10/21/15 0452  BP: 127/83  122/72 104/76  Pulse:   85 77  Temp:  97.3 F (36.3 C) 97.6 F (36.4 C) 97.4 F (36.3 C)  TempSrc:  Oral Oral Oral  Resp: 17  20 18   Height:      Weight:   49.9 kg (110 lb 0.2 oz)   SpO2: 94%  100%  98%    Intake/Output Summary (Last 24 hours) at 10/21/15 1232 Last data filed at 10/21/15 0500  Gross per 24 hour  Intake   1975 ml  Output    650 ml  Net   1325 ml   Filed Weights   10/19/15 1845 10/20/15 1901  Weight: 44.7 kg (98 lb 8.7 oz) 49.9 kg (110 lb 0.2 oz)    Exam:   General:  Awake, in nad sitting at the side of the bed  Cardiovascular: regular, s1, s2  Respiratory: normal resp effort, no wheezing  Abdomen: soft,nondistended, obese  Musculoskeletal: perfused, no clubbing   Data Reviewed: Basic Metabolic Panel:  Recent Labs Lab 10/19/15 1614  10/20/15 0217 10/20/15 0711 10/20/15 1241 10/20/15 2000 10/21/15 0445  NA 109*  < > 116* 116* 120* 125* 127*  K 2.9*  < > 4.0 3.6 3.6 4.1 3.4*  CL 65*  < > 73* 77* 82* 85* 92*  CO2 28  --  27 27 25 28 26   GLUCOSE 193*  < > 96 98 125* 161* 89  BUN 24*  < > 19 17 16 20 17   CREATININE 1.05*  < > 0.89 0.83 0.68 0.86 0.78  CALCIUM 8.5*  --  8.7* 8.3* 8.4* 8.7* 8.3*  MG 2.2  --   --   --   --   --   --   < > = values in this  interval not displayed. Liver Function Tests: No results for input(s): AST, ALT, ALKPHOS, BILITOT, PROT, ALBUMIN in the last 168 hours. No results for input(s): LIPASE, AMYLASE in the last 168 hours. No results for input(s): AMMONIA in the last 168 hours. CBC:  Recent Labs Lab 10/19/15 1614 10/19/15 1629 10/20/15 0113  WBC 13.3*  --  10.4  NEUTROABS 12.2*  --   --   HGB 19.1* 20.7* 17.5*  HCT 52.9* 61.0* 49.5*  MCV 77.2*  --  78.4  PLT 152  --  149*   Cardiac Enzymes: No results for input(s): CKTOTAL, CKMB, CKMBINDEX, TROPONINI in the last 168 hours. BNP (last 3 results) No results for input(s): BNP in the last 8760 hours.  ProBNP (last 3 results) No results for input(s): PROBNP in the last 8760 hours.  CBG: No results for input(s): GLUCAP in the last 168 hours.  Recent Results (from the past 240 hour(s))  MRSA PCR Screening     Status: None   Collection Time: 10/19/15 10:26  PM  Result Value Ref Range Status   MRSA by PCR NEGATIVE NEGATIVE Final    Comment:        The GeneXpert MRSA Assay (FDA approved for NASAL specimens only), is one component of a comprehensive MRSA colonization surveillance program. It is not intended to diagnose MRSA infection nor to guide or monitor treatment for MRSA infections.      Studies: No results found.  Scheduled Meds: . antiseptic oral rinse  7 mL Mouth Rinse q12n4p  . aspirin EC  81 mg Oral QHS  . chlorhexidine  15 mL Mouth Rinse BID  . enoxaparin (LOVENOX) injection  30 mg Subcutaneous Q24H  . lactose free nutrition  237 mL Oral TID WC  . levothyroxine  75 mcg Oral QAC breakfast  . mycophenolate  1,000 mg Oral BID  . nystatin  5 mL Oral QID  . [START ON 10/22/2015] predniSONE  10 mg Oral Q breakfast   And  . predniSONE  5 mg Oral QPC lunch  . simvastatin  40 mg Oral QHS  . sodium chloride  3 mL Intravenous Q12H  . sulfamethoxazole-trimethoprim  1 tablet Oral Once per day on Mon Wed Fri   Continuous Infusions: . sodium chloride 75 mL/hr at 10/21/15 [id number]    Active Problems:   Interstitial lung disease ([place])   Pulmonary hypertension ([place])   Chronic hypoxemic respiratory failure ([place])   Hyponatremia   Hypokalemia   [facility], [place] [facility] Pager [phone]. If 7PM-7AM, please contact night-coverage at [email or web address], password [facility] 10/21/2015, 12:32 PM  LOS: 2 days

## 2015-10-21 NOTE — Care Management Note (Signed)
Case Management Note  Patient Details  Name: ZACORIA REZNICEK MRN: [id number] Date of Birth: 20-Feb-1946  Subjective/Objective: 69 y/o f admitted w/hyponatremia. Full code.Has home 02, received referral for [facility] services prior to admission.From home. Spoke to [place] Stacey-will follow patient @ d/c in home.Patient/MD agreed.                  Action/Plan:d/c home w/[facility].   Expected Discharge Date:   (unknown)               Expected Discharge Plan:  Home/Self Care  In-House Referral:  NA  Discharge planning Services  CM Consult  Post Acute Care Choice:  Resumption of Svcs/PTA Provider ([facility]-otpt services referral ) Choice offered to:     DME Arranged:    DME Agency:     HH Arranged:    [place] Agency:     Status of Service:  Completed, signed off  Medicare Important Message Given:    Date Medicare IM Given:    Medicare IM give by:    Date Additional Medicare IM Given:    Additional Medicare Important Message give by:     If discussed at [place] of Stay Meetings, dates discussed:    Additional Comments:  Dessa Phi, RN 10/21/2015, 10:41 AM

## 2015-10-22 ENCOUNTER — Inpatient Hospital Stay (HOSPITAL_COMMUNITY): Payer: Medicare Other

## 2015-10-22 LAB — BASIC METABOLIC PANEL
Anion gap: 10 (ref 5–15)
Anion gap: 14 (ref 5–15)
BUN: 22 mg/dL — AB (ref 6–20)
BUN: 22 mg/dL — AB (ref 6–20)
CALCIUM: 8.7 mg/dL — AB (ref 8.9–10.3)
CALCIUM: 8.6 mg/dL — AB (ref 8.9–10.3)
CO2: 20 mmol/L — ABNORMAL LOW (ref 22–32)
CO2: 18 mmol/L — ABNORMAL LOW (ref 22–32)
CREATININE: 1.05 mg/dL — AB (ref 0.44–1.00)
Chloride: 96 mmol/L — ABNORMAL LOW (ref 101–111)
Chloride: 95 mmol/L — ABNORMAL LOW (ref 101–111)
Creatinine, Ser: 0.95 mg/dL (ref 0.44–1.00)
GFR calc Af Amer: >60 mL/min (ref >60)
GFR calc Af Amer: >60 mL/min (ref >60)
GFR, EST NON AFRICAN AMERICAN: 60 mL/min — AB (ref >60)
GFR, EST NON AFRICAN AMERICAN: 53 mL/min — AB (ref >60)
Glucose, Bld: 150 mg/dL — ABNORMAL HIGH (ref 65–99)
Glucose, Bld: 204 mg/dL — ABNORMAL HIGH (ref 65–99)
POTASSIUM: 4.8 mmol/L (ref 3.5–5.1)
POTASSIUM: 5.2 mmol/L — AB (ref 3.5–5.1)
SODIUM: 126 mmol/L — AB (ref 135–145)
SODIUM: 127 mmol/L — AB (ref 135–145)

## 2015-10-22 MED ORDER — FUROSEMIDE 10 MG/ML IJ SOLN
40.0000 mg | Freq: Once | INTRAMUSCULAR | Status: AC
  Administered 2015-10-22: 40 mg via INTRAVENOUS
  Filled 2015-10-22: qty 4

## 2015-10-22 NOTE — Evaluation (Signed)
Occupational Therapy Evaluation Patient Details Name: [name] [name] MRN: [id number] DOB: February 21, 1946 Today's Date: 10/22/2015    History of Present Illness 69 yo female admitted with hyponatremia. Hx of interstitial lung disease, O2 dep-7L, pulm HTN   Clinical Impression   Pt was admitted for the above.  She currently needs min guard assist for adls and will have [id number] at home.  Pt does not need any further OT at this time.      Follow Up Recommendations  Supervision/Assistance - 24 hour;No OT follow up    Equipment Recommendations  None recommended by OT    Recommendations for Other Services       Precautions / Restrictions Precautions Precautions: Fall Precaution Comments: O2 dep-monitor sats, uses 7 liters at baseline Restrictions Weight Bearing Restrictions: No      Mobility Bed Mobility               General bed mobility comments: oob in chair  Transfers Overall transfer level: Needs assistance   Transfers: Sit to/from Stand Sit to Stand: Min guard         General transfer comment: close guard for safety    Balance Overall balance assessment: Needs assistance         Standing balance support: During functional activity Standing balance-Leahy Scale: Fair                              ADL Overall ADL's : Needs assistance/impaired                                       General ADL Comments: pt is able to perform ADL with set up and min guard for balance for sit to stand/ambulating to bathroom.  She usually doesn't wear socks.  Pt will have 24/7 at home and help as needed.  Pt does not get up to use bathroom at night and doesn't feel she will need 3:1 at night.  Pt's sats initially 97% on 7 liters at rest.  Dropped to 84% when walking on 8 liters of 02.       Vision     Perception     Praxis      Pertinent Vitals/Pain Pain Assessment: No/denies pain     Hand Dominance     Extremity/Trunk Assessment Upper  Extremity Assessment Upper Extremity Assessment: Overall WFL for tasks assessed      Cervical / Trunk Assessment Cervical / Trunk Assessment: Kyphotic   Communication Communication Communication: No difficulties   Cognition Arousal/Alertness: Awake/alert Behavior During Therapy: WFL for tasks assessed/performed Overall Cognitive Status: Within Functional Limits for tasks assessed                     General Comments       Exercises       Shoulder Instructions      Home Living Family/patient expects to be discharged to:: Private residence Living Arrangements: Alone Available Help at Discharge: Family;Available PRN/intermittently Type of Home: House Home Access: Stairs to enter [employer] of Steps: 2 Entrance Stairs-Rails: None Home Layout: One level     Bathroom Shower/Tub:  (sponge bathes)   Bathroom Toilet: Standard         Additional Comments: pt states she is unsure of equip she may have in attic:  she should have toilet riser if needed; pt  has short stature      Prior Functioning/Environment Level of Independence: Independent             OT Diagnosis: Generalized weakness   OT Problem List:     OT Treatment/Interventions:      OT Goals(Current goals can be found in the care plan section) Acute Rehab OT Goals Patient Stated Goal: home soon.  OT Goal Formulation: All assessment and education complete, DC therapy  OT Frequency:     Barriers to D/C:            Co-evaluation PT/OT/SLP Co-Evaluation/Treatment: Yes Reason for Co-Treatment: Other (comment) (endurance) PT goals addressed during session: Mobility/safety with mobility OT goals addressed during session: Proper use of Adaptive equipment and DME      End of Session    Activity Tolerance: Patient tolerated treatment well Patient left: in chair;with call bell/phone within reach;with family/visitor present   Time: [id number] OT Time Calculation (min): 19  min Charges:  OT General Charges $OT Visit: 1 Procedure OT Evaluation $Initial OT Evaluation Tier I: 1 Procedure G-Codes:    Yavier Snider 11/19/15, 12:41 PM  Lesle Chris, OTR/L [phone] 11/19/2015

## 2015-10-22 NOTE — Evaluation (Signed)
Physical Therapy Evaluation Patient Details Name: [name] [name] MRN: [id number] DOB: [birth year] Today's Date: 10/22/2015   History of Present Illness  69 yo female admitted with hyponatremia. Hx of interstitial lung disease, O2 dep-7L, pulm HTN  Clinical Impression  On eval, pt was Min guard assist (very close) for mobility-walked ~55 feet. O2 sats down to 84% on 8L O2 during ambulation; 97% 7L O2 at rest. Family present-state they are available to assist and that pt is not going home alone. Recommend [facility] follow up at discharge.     Follow Up Recommendations Home health PT;Supervision - Intermittent    Equipment Recommendations  None recommended by PT    Recommendations for Other Services OT consult     Precautions / Restrictions Precautions Precautions: Fall Precaution Comments: O2 dep-monitor sats Restrictions Weight Bearing Restrictions: No      Mobility  Bed Mobility               General bed mobility comments: oob in chair  Transfers Overall transfer level: Needs assistance   Transfers: Sit to/from Stand Sit to Stand: Min guard         General transfer comment: close guard for safety  Ambulation/Gait Ambulation/Gait assistance: Min guard Ambulation Distance (Feet): 55 Feet Assistive device: None Gait Pattern/deviations: Step-through pattern;Decreased stride length;Staggering right;Staggering left;Drifts right/left     General Gait Details: very close guard for safety. Unsteady at times. O2 sats ~84% on 8L O2 during ambulation. Dyspnea 2-3/4  Stairs            Wheelchair Mobility    Modified Rankin (Stroke Patients Only)       Balance Overall balance assessment: Needs assistance         Standing balance support: During functional activity Standing balance-Leahy Scale: Fair                               Pertinent Vitals/Pain Pain Assessment: No/denies pain    Home Living Family/patient expects to be discharged to::  Private residence Living Arrangements: Alone Available Help at Discharge: Family;Available PRN/intermittently Type of Home: House Home Access: Stairs to enter Entrance Stairs-Rails: None Entrance Stairs-Number of Steps: 2 Home Layout: One level   Additional Comments: pt states she is unsure of equip she may have in attic    Prior Function Level of Independence: Independent               Hand Dominance        Extremity/Trunk Assessment   Upper Extremity Assessment: Defer to OT evaluation           Lower Extremity Assessment: Generalized weakness      Cervical / Trunk Assessment: Kyphotic  Communication   Communication: No difficulties  Cognition Arousal/Alertness: Awake/alert Behavior During Therapy: WFL for tasks assessed/performed Overall Cognitive Status: Within Functional Limits for tasks assessed                      General Comments      Exercises        Assessment/Plan    PT Assessment Patient needs continued PT services  PT Diagnosis Difficulty walking;Generalized weakness   PT Problem List Decreased strength;Decreased activity tolerance;Decreased balance;Decreased mobility  PT Treatment Interventions Gait training;Functional mobility training;Therapeutic activities;Patient/family education;Balance training;Therapeutic exercise   PT Goals (Current goals can be found in the Care Plan section) Acute Rehab PT Goals Patient Stated Goal: home soon.  PT Goal Formulation:  With patient/family Time For Goal Achievement: 11/05/15 Potential to Achieve Goals: Good    Frequency Min 3X/week   Barriers to discharge        Co-evaluation               End of Session Equipment Utilized During Treatment: Gait belt;Oxygen Activity Tolerance: Patient tolerated treatment well Patient left: in chair;with call bell/phone within reach;with family/visitor present           Time: [id number] PT Time Calculation (min) (ACUTE ONLY): 25  min   Charges:   PT Evaluation $Initial PT Evaluation Tier I: 1 Procedure     PT G Codes:        Weston Anna, MPT Pager: [phone]

## 2015-10-22 NOTE — Progress Notes (Signed)
[facility] Liaison Note  Received request for home with hospice services prior to this hospital admission.  Chart and patient information reviewed and hospice eligibility confirmed.  Spoke with daughter, [name] [name] and patient, at bedside to initiate education related to hospice philosophy, services and team approach to care. Family verbalized understanding of the information provided. Per discussion plan is for discharge to home by personal vehicle with daughter today or tomorrow. Plan is for admission to hospice services Monday at 10am.  DME needs discussed and family denied any needs.  Patient currently has oxygen through [facility]  [facility] aware of the above.  Completed discharge summary will need to be faxed to [facility] at [phone] when final. [facility] information and contact numbers have been given toRebeccaduring visit.  Please call with any questions. Thank Rhae Lerner RN, [place] [facility] Liaison [phone]

## 2015-10-22 NOTE — Progress Notes (Signed)
[facility] PROGRESS NOTE  [name] [name] [id number] DOB: [birth year] DOA: 10/19/2015 PCP: Tivis Ringer, MD  HPI/Brief narrative 70 y.o. female with PMHX of ILD and pulm HTN. She was recently seen by Dr. Lake Bells who stopped her spironolactone and started zaroxolyn. She took 5 doses of 2.5 and stopped on Friday as her swelling had resolved. She had a BMP done with Na of 112. She was sent to the ER  Assessment/Plan: Hyponatremia due to dehydration due to diuretics -Patient had started on zaroxolyn and took 2.5 mg x 5 days and stopped as directed as her swelling resolved. Patient presented still taking her lasix as well -Diuretics had been on hold and sodium had been improving nicely with gentle hydration -Na has remained stable at 126 this AM associated with increased LE swelling - Continue Regular diet for now - Given concerns of possible volume overload in the setting of pulmonary hypertension and diastolic heart failure, have given 1 dose of Lasix empirically with good urine output. Repeat sodium of 127. -Continue to monitor  Hypokalemia -Replaced -Continue to follow and replace electrolytes as needed  ILD with pulm HTN -follows with pulm -Currently stable on baseline home O2 -Discussed with case manager. Patient is currently being set up with home hospice per her primary pulmonologist, Dr. Lake Bells  Chronic hypoxemia resp failure -wears 7L O2 at home at baseline  Insomnia -ambien ordered as tolerated  Acute on Chronic Diastolic heart failure - Seen by cardiology -Patient with increased lower extremity edema -Given 1 dose of Lasix empirically with good urine output  Code Status: Full Family Communication: Patient in room Disposition Plan: D/c home when sodium normalizes   Consultants:    Procedures:    Antibiotics: Anti-infectives    Start     Dose/Rate Route Frequency Ordered Stop   10/20/15 1800  sulfamethoxazole-trimethoprim (BACTRIM DS,SEPTRA  DS) 800-160 MG per tablet 1 tablet     1 tablet Oral Once per day on Mon Wed Fri 10/20/15 1545        HPI/Subjective: Looking forward to going home  Objective: Filed Vitals:   10/21/15 1526 10/21/15 2229 10/22/15 0534 10/22/15 1519  BP: 119/58 126/75 116/72 114/68  Pulse: 93 98 89 86  Temp: 97.4 F (36.3 C)  97.3 F (36.3 C)   TempSrc: Oral  Oral   Resp: 20 24 20 18   Height:      Weight:      SpO2: 94% 95% 95% 93%    Intake/Output Summary (Last 24 hours) at 10/22/15 1818 Last data filed at 10/22/15 1811  Gross per 24 hour  Intake   2370 ml  Output   2100 ml  Net    270 ml   Filed Weights   10/19/15 1845 10/20/15 1901  Weight: 44.7 kg (98 lb 8.7 oz) 49.9 kg (110 lb 0.2 oz)    Exam:  General:  Awake, in nad sitting at the side of the bed  Cardiovascular: regular, s1, s2  Respiratory: normal resp effort, no wheezing  Abdomen: soft,nondistended, obese  Musculoskeletal: perfused, no clubbing, no cyanosis, increasing LE edema  Data Reviewed: Basic Metabolic Panel:  Recent Labs Lab 10/19/15 1614  10/20/15 1241 10/20/15 2000 10/21/15 0445 10/22/15 0520 10/22/15 1342  NA 109*  < > 120* 125* 127* 126* 127*  K 2.9*  < > 3.6 4.1 3.4* 4.8 5.2*  CL 65*  < > 82* 85* 92* 96* 95*  CO2 28  < > 25 28 26  20* 18*  GLUCOSE 193*  < >  125* 161* 89 150* 204*  BUN 24*  < > 16 20 17  22* 22*  CREATININE 1.05*  < > 0.68 0.86 0.78 0.95 1.05*  CALCIUM 8.5*  < > 8.4* 8.7* 8.3* 8.7* 8.6*  MG 2.2  --   --   --   --   --   --   < > = values in this interval not displayed. Liver Function Tests: No results for input(s): AST, ALT, ALKPHOS, BILITOT, PROT, ALBUMIN in the last 168 hours. No results for input(s): LIPASE, AMYLASE in the last 168 hours. No results for input(s): AMMONIA in the last 168 hours. CBC:  Recent Labs Lab 10/19/15 1614 10/19/15 1629 10/20/15 0113  WBC 13.3*  --  10.4  NEUTROABS 12.2*  --   --   HGB 19.1* 20.7* 17.5*  HCT 52.9* 61.0* 49.5*  MCV 77.2*  --   78.4  PLT 152  --  149*   Cardiac Enzymes: No results for input(s): CKTOTAL, CKMB, CKMBINDEX, TROPONINI in the last 168 hours. BNP (last 3 results) No results for input(s): BNP in the last 8760 hours.  ProBNP (last 3 results) No results for input(s): PROBNP in the last 8760 hours.  CBG: No results for input(s): GLUCAP in the last 168 hours.  Recent Results (from the past 240 hour(s))  MRSA PCR Screening     Status: None   Collection Time: 10/19/15 10:26 PM  Result Value Ref Range Status   MRSA by PCR NEGATIVE NEGATIVE Final    Comment:        The GeneXpert MRSA Assay (FDA approved for NASAL specimens only), is one component of a comprehensive MRSA colonization surveillance program. It is not intended to diagnose MRSA infection nor to guide or monitor treatment for MRSA infections.      Studies: Dg Chest Port 1 View  10/22/2015  CLINICAL DATA:  CHF EXAM: PORTABLE CHEST 1 VIEW COMPARISON:  Chest CT 06/02/2015.  Chest x-ray 07/27/2014 FINDINGS: Cardiomegaly. Diffuse interstitial prominence throughout the lungs, similar to prior study. I favor this represents chronic interstitial lung disease. A component of interstitial edema cannot be excluded. Small left pleural effusion. No acute bony abnormality. IMPRESSION: Diffuse coarsened interstitial opacities throughout the lungs, similar to prior study. Favor chronic interstitial lung disease although a component of interstitial edema cannot be excluded. Small left effusion. Electronically Signed   By: Rolm Baptise M.D.   On: 10/22/2015 10:16    Scheduled Meds: . antiseptic oral rinse  7 mL Mouth Rinse q12n4p  . aspirin EC  81 mg Oral QHS  . chlorhexidine  15 mL Mouth Rinse BID  . enoxaparin (LOVENOX) injection  30 mg Subcutaneous Q24H  . lactose free nutrition  237 mL Oral TID WC  . levothyroxine  75 mcg Oral QAC breakfast  . mycophenolate  1,000 mg Oral BID  . nystatin  5 mL Oral QID  . predniSONE  10 mg Oral Q breakfast    And  . predniSONE  5 mg Oral QPC lunch  . simvastatin  40 mg Oral QHS  . sodium chloride  3 mL Intravenous Q12H  . sulfamethoxazole-trimethoprim  1 tablet Oral Once per day on Mon Wed Fri   Continuous Infusions:    Active Problems:   Interstitial lung disease ([place])   Pulmonary hypertension ([place])   Chronic hypoxemic respiratory failure ([place])   Hyponatremia   Hypokalemia   [name] [name], [place] [facility] Pager [phone]. If 7PM-7AM, please contact night-coverage at [email or web address], password [facility]  10/22/2015, 6:18 PM  LOS: 3 days

## 2015-10-23 LAB — BASIC METABOLIC PANEL
Anion gap: 12 (ref 5–15)
BUN: 24 mg/dL — AB (ref 6–20)
CHLORIDE: 95 mmol/L — AB (ref 101–111)
CO2: 22 mmol/L (ref 22–32)
CREATININE: 0.88 mg/dL (ref 0.44–1.00)
Calcium: 8.9 mg/dL (ref 8.9–10.3)
GFR calc Af Amer: >60 mL/min (ref >60)
GFR calc non Af Amer: >60 mL/min (ref >60)
GLUCOSE: 99 mg/dL (ref 65–99)
POTASSIUM: 4.3 mmol/L (ref 3.5–5.1)
Sodium: 129 mmol/L — ABNORMAL LOW (ref 135–145)

## 2015-10-23 MED ORDER — POLYETHYLENE GLYCOL 3350 17 G PO PACK: 17.0000 g | PACK | Freq: Every day | ORAL | Status: DC | PRN

## 2015-10-23 NOTE — Discharge Summary (Signed)
Physician Discharge Summary  [name] [name] [id number] DOB: 1946/05/02 DOA: 10/19/2015  PCP: Tivis Ringer, MD  Admit date: 10/19/2015 Discharge date: 10/23/2015  Time spent: 20 minutes  Recommendations for Outpatient Follow-up:  1. Follow up with PCP in 2-3 weeks 2. Follow up with Dr. Lake Bells as scheduled 3. Follow up with Hospice as scheduled 4. Recommend repeating BMET, focus on Na within one week   Discharge Diagnoses:  Active Problems:   Interstitial lung disease ([place])   Pulmonary hypertension (HCC)   Chronic hypoxemic respiratory failure (HCC)   Hyponatremia   Hypokalemia   Discharge Condition: Improved  Diet recommendation: Heart healthy  Filed Weights   10/19/15 1845 10/20/15 1901  Weight: 44.7 kg (98 lb 8.7 oz) 49.9 kg (110 lb 0.2 oz)    History of present illness:  Please review dictated H and P from 12/20 for details. Briefly, 69 y.o. female with PMHX of ILD and pulm HTN. She was recently seen by Dr. Lake Bells who stopped her spironolactone and started zaroxolyn. She took 5 doses of 2.5 and stopped on Friday as her swelling had resolved. She had a BMP done with Na of 112. She was sent to the ER  Hospital Course:  Hyponatremia due to dehydration due to diuretics -Patient reportedly started on zaroxolyn and took 2.5 mg x 5 days and stopped as directed as her swelling resolved. Patient presented with continued lasix as well -On admission, diuretics had been on hold and sodium had been improving nicely with gentle hydration -Na levels later noted to plateau at 126 on 12/23 associated with increased LE swelling -Given concerns of possible volume overload in the setting of pulmonary hypertension and diastolic heart failure, gave 1 dose of Lasix with good urine output and pt placed on fluid restricted diet -Sodium levels have since corrected nicely to 129 -Recommend continued lasix alone on discharge  Hypokalemia -Replaced -Continue to follow and replace  electrolytes as needed  ILD with pulm HTN -follows with pulm -Currently stable on baseline home O2 -Discussed with case manager. Patient is currently being set up with home hospice per her primary pulmonologist, Dr. Lake Bells  Chronic hypoxemia resp failure -wears 7L O2 at home at baseline  Insomnia -ambien ordered as tolerated  Acute on Chronic Diastolic heart failure - Seen by cardiology -Patient with increased lower extremity edema -Given 1 dose of Lasix with good urine output -Improved  Discharge Exam: Filed Vitals:   10/22/15 2107 10/22/15 2143 10/22/15 2320 10/23/15 0537  BP: 134/79   147/82  Pulse: 88   89  Temp:  96.3 F (35.7 C) 97.5 F (36.4 C) 97.3 F (36.3 C)  TempSrc:  Rectal Oral Oral  Resp: 18   18  Height:      Weight:      SpO2: 95%   91%    General: Awake, in nad Cardiovascular: regular, s1, s2 Respiratory: normal resp effort, no wheezing  Discharge Instructions     Medication List    STOP taking these medications        metolazone 2.5 MG tablet  Commonly known as:  ZAROXOLYN      TAKE these medications        ALPRAZolam 0.25 MG tablet  Commonly known as:  XANAX  Take 0.25 mg by mouth 2 (two) times daily as needed. for anxiety prn only take twice a month     aspirin EC 81 MG tablet  Take 81 mg by mouth at bedtime.     furosemide 20  MG tablet  Commonly known as:  LASIX  Take 2 tablets in the morning and 2 in the afternoon.     gabapentin 300 MG capsule  Commonly known as:  NEURONTIN  Take 1 capsule by mouth 2 (two) times daily. Take 1 tab twice a day as needed nerve pain.     levothyroxine 75 MCG tablet  Commonly known as:  SYNTHROID, LEVOTHROID  Take 75 mcg by mouth daily before breakfast.     mycophenolate 500 MG tablet  Commonly known as:  CELLCEPT  Take 2 tablets (1,000 mg total) by mouth 2 (two) times daily.     predniSONE 10 MG tablet  Commonly known as:  DELTASONE  TAKE AS DIRECTED     simvastatin 40 MG tablet   Commonly known as:  ZOCOR  Take 40 mg by mouth at bedtime.     sulfamethoxazole-trimethoprim 800-160 MG tablet  Commonly known as:  BACTRIM DS,SEPTRA DS  Take 1 tablet by mouth 3 (three) times a week.       Allergies  Allergen Reactions  . Morphine And Related Nausea And Vomiting   Follow-up Information    Follow up with Hospice at [facility].   Specialty:  Hospice and Palliative Medicine   Contact information:   [place] [id number] [phone]       Follow up with Tivis Ringer, MD. Schedule an appointment as soon as possible for a visit in 1 week.   Specialty:  Internal Medicine   Why:  Hospital follow up   Contact information:   [address] [place] [address] [phone]        The results of significant diagnostics from this hospitalization (including imaging, microbiology, ancillary and laboratory) are listed below for reference.    Significant Diagnostic Studies: Dg Chest Port 1 View  10/22/2015  CLINICAL DATA:  CHF EXAM: PORTABLE CHEST 1 VIEW COMPARISON:  Chest CT 06/02/2015.  Chest x-ray 07/27/2014 FINDINGS: Cardiomegaly. Diffuse interstitial prominence throughout the lungs, similar to prior study. I favor this represents chronic interstitial lung disease. A component of interstitial edema cannot be excluded. Small left pleural effusion. No acute bony abnormality. IMPRESSION: Diffuse coarsened interstitial opacities throughout the lungs, similar to prior study. Favor chronic interstitial lung disease although a component of interstitial edema cannot be excluded. Small left effusion. Electronically Signed   By: Rolm Baptise M.D.   On: 10/22/2015 10:16    Microbiology: Recent Results (from the past 240 hour(s))  MRSA PCR Screening     Status: None   Collection Time: 10/19/15 10:26 PM  Result Value Ref Range Status   MRSA by PCR NEGATIVE NEGATIVE Final    Comment:        The GeneXpert MRSA Assay (FDA approved for NASAL  specimens only), is one component of a comprehensive MRSA colonization surveillance program. It is not intended to diagnose MRSA infection nor to guide or monitor treatment for MRSA infections.      Labs: Basic Metabolic Panel:  Recent Labs Lab 10/19/15 1614  10/20/15 2000 10/21/15 0445 10/22/15 0520 10/22/15 1342 10/23/15 0531  NA 109*  < > 125* 127* 126* 127* 129*  K 2.9*  < > 4.1 3.4* 4.8 5.2* 4.3  CL 65*  < > 85* 92* 96* 95* 95*  CO2 28  < > 28 26 20* 18* 22  GLUCOSE 193*  < > 161* 89 150* 204* 99  BUN 24*  < > 20 17 22* 22* 24*  CREATININE 1.05*  < >  0.86 0.78 0.95 1.05* 0.88  CALCIUM 8.5*  < > 8.7* 8.3* 8.7* 8.6* 8.9  MG 2.2  --   --   --   --   --   --   < > = values in this interval not displayed. Liver Function Tests: No results for input(s): AST, ALT, ALKPHOS, BILITOT, PROT, ALBUMIN in the last 168 hours. No results for input(s): LIPASE, AMYLASE in the last 168 hours. No results for input(s): AMMONIA in the last 168 hours. CBC:  Recent Labs Lab 10/19/15 1614 10/19/15 1629 10/20/15 0113  WBC 13.3*  --  10.4  NEUTROABS 12.2*  --   --   HGB 19.1* 20.7* 17.5*  HCT 52.9* 61.0* 49.5*  MCV 77.2*  --  78.4  PLT 152  --  149*   Cardiac Enzymes: No results for input(s): CKTOTAL, CKMB, CKMBINDEX, TROPONINI in the last 168 hours. BNP: BNP (last 3 results) No results for input(s): BNP in the last 8760 hours.  ProBNP (last 3 results) No results for input(s): PROBNP in the last 8760 hours.  CBG: No results for input(s): GLUCAP in the last 168 hours.     Signed:  Ruie Sendejo, Orpah Melter  [facility] 10/23/2015, 11:28 AM

## 2015-10-25 ENCOUNTER — Other Ambulatory Visit: Payer: Self-pay | Admitting: Pulmonary Disease

## 2015-10-26 ENCOUNTER — Telehealth: Payer: Self-pay | Admitting: Pulmonary Disease

## 2015-10-26 NOTE — Telephone Encounter (Signed)
Will route to Dr. Lake Bells

## 2015-10-26 NOTE — Telephone Encounter (Signed)
Called spoke with [name] [name]. She reports pt was released from hospital on 12/24. Pt is now taking lasix 20 mg 2 tabs in AM and 2 in PM. Pt legs are still very swollen and since pt has a scratch on one her legs, fluid is leaking from that leg. They are not warm, tender to touch. She is keeping legs elevated. Wants to know if pt needs to do anything further? Also pt has an appt 1/30 and wants to know if she needs f/u sooner. Please advise Dr. Lake Bells thanks

## 2015-10-26 NOTE — Telephone Encounter (Signed)
Tell her to wear compression stockings Let us know if redness worsens or if she has a fever

## 2015-10-26 NOTE — Telephone Encounter (Signed)
Needs acute visit this week Can overbook me Friday if no one else can see her

## 2015-10-26 NOTE — Telephone Encounter (Signed)
BQ has been paged at 3:48. Will send message to VS as he is doc of the day to address in BQ's absence   VS please advise

## 2015-10-26 NOTE — Telephone Encounter (Signed)
Called and spoke with patient. Informed her of BQ's recs. Scheduled on 10/29/15 at 2:45 per BQ. Pt voiced understanding and had no further questions. Nothing further needed.

## 2015-10-29 ENCOUNTER — Ambulatory Visit (INDEPENDENT_AMBULATORY_CARE_PROVIDER_SITE_OTHER): Payer: Medicare Other | Admitting: Pulmonary Disease

## 2015-10-29 ENCOUNTER — Other Ambulatory Visit (INDEPENDENT_AMBULATORY_CARE_PROVIDER_SITE_OTHER)

## 2015-10-29 ENCOUNTER — Encounter: Payer: Self-pay | Admitting: Pulmonary Disease

## 2015-10-29 VITALS — BP 146/94 | HR 96 | Ht 62.0 in | Wt 110.0 lb

## 2015-10-29 DIAGNOSIS — I272 Other secondary pulmonary hypertension: Secondary | ICD-10-CM | POA: Diagnosis not present

## 2015-10-29 DIAGNOSIS — J849 Interstitial pulmonary disease, unspecified: Secondary | ICD-10-CM

## 2015-10-29 DIAGNOSIS — E871 Hypo-osmolality and hyponatremia: Secondary | ICD-10-CM | POA: Diagnosis not present

## 2015-10-29 DIAGNOSIS — Z5181 Encounter for therapeutic drug level monitoring: Secondary | ICD-10-CM | POA: Diagnosis not present

## 2015-10-29 DIAGNOSIS — J9611 Chronic respiratory failure with hypoxia: Secondary | ICD-10-CM

## 2015-10-29 LAB — BASIC METABOLIC PANEL
BUN: 25 mg/dL — AB (ref 6–23)
CHLORIDE: 90 meq/L — AB (ref 96–112)
CO2: 26 mEq/L (ref 19–32)
CREATININE: 1.11 mg/dL (ref 0.40–1.20)
Calcium: 9.4 mg/dL (ref 8.4–10.5)
GFR: 51.75 mL/min — ABNORMAL LOW (ref >60.00)
Glucose, Bld: 221 mg/dL — ABNORMAL HIGH (ref 70–99)
Potassium: 4.4 mEq/L (ref 3.5–5.1)
Sodium: 130 mEq/L — ABNORMAL LOW (ref 135–145)

## 2015-10-29 LAB — HEPATIC FUNCTION PANEL
ALT: 41 U/L — AB (ref 0–35)
AST: 30 U/L (ref 0–37)
Albumin: 3.9 g/dL (ref 3.5–5.2)
Alkaline Phosphatase: 364 U/L — ABNORMAL HIGH (ref 39–117)
BILIRUBIN DIRECT: 1.6 mg/dL — AB (ref 0.0–0.3)
TOTAL PROTEIN: 6.8 g/dL (ref 6.0–8.3)
Total Bilirubin: 2.7 mg/dL — ABNORMAL HIGH (ref 0.2–1.2)

## 2015-10-29 MED ORDER — TRAZODONE HCL 50 MG PO TABS: 50.0000 mg | ORAL_TABLET | Freq: Every day | ORAL | Status: AC

## 2015-10-29 MED ORDER — MEDICAL COMPRESSION THIGH HIGH MISC: Status: AC

## 2015-10-29 MED ORDER — MYCOPHENOLATE MOFETIL 500 MG PO TABS: 1000.0000 mg | ORAL_TABLET | Freq: Two times a day (BID) | ORAL | Status: AC

## 2015-10-29 MED ORDER — TADALAFIL (PAH) 20 MG PO TABS: 20.0000 mg | ORAL_TABLET | Freq: Every day | ORAL | Status: DC

## 2015-10-29 NOTE — Progress Notes (Signed)
Subjective:    Patient ID: [name] [name], female    DOB: 06/04/1946, 69 y.o.   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 Gwenette [name] with fibrosing NSIP PFT's 03/2013:  No obstruction, TLC 66% pred, DLCO 29% with partial correction with Av.  CXR 11/2012: mild cm, no acute process.  Muscle pressures 04/2013 normal HRCT 04/2013:  IS/GG changes with nonspecific pattern  Autoimmune panel 06/2013: normal VATS bx 07/2013:  Not c/w UIP, most c/w fibrosing variant of NSIP.  Has scattered multinucleated giant cells. Pt denies any exposure hx, unusual pets, unusual hobbies. HP panel 08/2013:  Not overly impressive.  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  Pt has done an environmental survey at home.  02/2014:  New complaint of joint swelling and pain >> refer to rheum [name] [name]).  Cellcept started 09/15/14 She had a lung transplant evaluation at [facility] in 2016 but failed transplant evaluation due to peripheral vascular disease.   HPI Chief Complaint  Patient presents with  . Acute Visit    pt c/o all over swelling, stable shortness of breath.  denies cough, mucus production.     [name] [name] had to go tot he hospital for low sodium.  She left on 12/24.  She said that she felt fine, but her daughter noted that she was confused.  She said that her breathing was a little better as was the swelling in her hospital.  There she had volume resuscitation.  The swelling is back now.  She has to keep her feet elevated.   She is taking the furosemide twice a day.  She is really bothered by the swelling in her legs. She would really stil like ot have better breathing "later in the afternoon".   She is also requesting something for a sleep aid. She feels fatigued all the time. She does feel significant shortness of breath. She continues to take the methotrexate.  Past Medical History  Diagnosis Date  . Peripheral arterial disease ([place])   . Hyperlipidemia   . Thyroid disease     Hypothyroidism  . Hypothyroidism    . Blood transfusion     2 /12 yrs ago  . Arthritis   . Arrhythmia     when hyperthyroid  . Dyspnea on exertion 01/22/2013  . GERD (gastroesophageal reflux disease)     otc  . Carotid artery occlusion       Review of Systems  Constitutional: Negative for fever, chills and fatigue.  HENT: Negative for nosebleeds, postnasal drip and rhinorrhea.   Respiratory: Positive for cough and shortness of breath. Negative for wheezing.   Cardiovascular: Negative for chest pain, palpitations and leg swelling.       Objective:   Physical Exam Filed Vitals:   10/29/15 1437  BP: 146/94  Pulse: 96  Height: 5\' 2"  (1.575 m)  Weight: 110 lb (49.896 kg)  SpO2: 81%   6 L [place]  Gen: chronically ill appearing HENT: OP clear, TM's clear, neck supple PULM: Crackles bilaterally, normal percussion CV: RRR, no mgr, significant leg and ankle edema in ankles bilaterally GI: BS+, soft, nontender Derm: no cyanosis or rash Psyche: normal mood and affect  2016 [facility]: RA: 12 RV: 68 12 14  PA: 68 26 41 PCW: 22 23 22  Oximetry Site Hgb (g/dL) O2 sat (%)  FA: 14.1 97.3  PA: 14.3 70.3  PA: 14.3 72.1  Calculations O2 consumption: 164.1 ML O2/min (assumed fick) EPBF (Qep): 3.3 L/min  Mean Hgb: 14.2 g/dl  Shunt ratio: 1.0 (Qp/Qs)  AV O2: 5.0 Vol% L to R shunt: 0.0 L/min (Qp-Qep)  PBF (Qp): 3.3 L/min R to L shunt: 0.0 L/min (Qs-Qep)  Cardiac output (Qs): 3.3 L/min  Cardiac Index: 2.2 L/min/m2  PVR: 5.8 wood units  SVR: 29.5 wood units   Her hospital records from last week were reviewed where she was admitted for hyponatremia. She was treated with saline initially and then eventually put back on diuretics by hospital discharge.  BMET    Component Value Date/Time   NA 130* 10/29/2015 1525   K 4.4 10/29/2015 1525   CL 90* 10/29/2015 1525   CO2 26 10/29/2015 1525   GLUCOSE 221* 10/29/2015 1525   BUN 25* 10/29/2015 1525   CREATININE 1.11 10/29/2015 1525   CALCIUM 9.4 10/29/2015 1525    GFRNONAA >60 10/23/2015 0531   GFRAA >60 10/23/2015 0531        Assessment & Plan:  Pulmonary hypertension ([place]) Because she has struggled with multiple diuretics (some causing hospital visit for hyponatremia, others being ineffective), long-term edema, and significant dyspnea I explained to her today that it may be worth a therapeutic trial of a PDE 5 inhibitor to see if causing some vasodilatation will improve her symptoms. I'm reluctant to try other loop diuretics or other forms of diuretic therapy because of the complication she's had recently.  I explained to her and her family the following.  We are going to start a medication for your pulmonary hypertension called Adcirca.  You should never take this medication with nitroglycerin or any medication class called nitrates.  We may need to adjust the dose of this medication if your kidney function is abnormal.  The starting dose of this medication is 20 mg daily, we may increase it to 40 mg daily if you are doing well. The most common side effect is low blood pressure, so if you feel dizzy or lightheaded please let us know.  We will monitor your kidney and liver function tests every other week for the first month followed by testing every month for the first 3 months. After that monitor these tests every 3 months.  After discussion they decided to proceed with this. She was given a sample today in the office and she will take the first dose tomorrow.  She will continue taking diuretics as prescribed, specifically Lasix twice a day She is going to use compression stockings as well.   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 She has been turned down by multiple centers for lung transplantation because of vascular disease. Her symptoms have progressed to the point of needing hospice at this point. I advised her today that she should continue taking the  CellCept as prescribed in addition to the prednisone and Bactrim. However, we may be nearing a point where we need to consider using morphine because of her profound dyspnea.  Chronic hypoxemic respiratory failure (HCC) Continue 6 L of oxygen continuously.  Hyponatremia This problem has improved since adjusting the diuretics in Hospital. We will check a basic metabolic panel today to follow-up as she is still on diuretic therapy.  > 25 minutes spent in direct consultation as part of a 41 minute visit   Current outpatient prescriptions:  .  ALPRAZolam (XANAX) 0.25 MG tablet, Take 0.25 mg by mouth 2 (two) times daily as needed. for anxiety prn only take twice a month, Disp: , Rfl: 1 .  aspirin EC 81 MG  tablet, Take 81 mg by mouth at bedtime., Disp: , Rfl:  .  furosemide (LASIX) 20 MG tablet, TAKE 2 TABLETS IN THE MORNING AND 2 IN THE AFTERNOON., Disp: 120 tablet, Rfl: 0 .  gabapentin (NEURONTIN) 300 MG capsule, Take 1 capsule by mouth 2 (two) times daily. Take 1 tab twice a day as needed nerve pain., Disp: , Rfl: 6 .  levothyroxine (SYNTHROID, LEVOTHROID) 75 MCG tablet, Take 75 mcg by mouth daily before breakfast., Disp: , Rfl:  .  mycophenolate (CELLCEPT) 500 MG tablet, Take 2 tablets (1,000 mg total) by mouth 2 (two) times daily., Disp: 120 tablet, Rfl: 5 .  predniSONE (DELTASONE) 10 MG tablet, TAKE AS DIRECTED, Disp: 45 tablet, Rfl: 5 .  simvastatin (ZOCOR) 40 MG tablet, Take 40 mg by mouth at bedtime. , Disp: , Rfl:  .  sulfamethoxazole-trimethoprim (BACTRIM DS,SEPTRA DS) 800-160 MG tablet, Take 1 tablet by mouth 3 (three) times a week., Disp: 12 tablet, Rfl: 5 .  zolpidem (AMBIEN) 5 MG tablet, Take 5 mg by mouth at bedtime as needed for sleep., Disp: , Rfl:  .  Elastic Bandages & Supports (MEDICAL COMPRESSION THIGH HIGH) MISC, 30mmhg -wear daily, Disp: 2 each, Rfl: 0 .  Tadalafil, PAH, (ADCIRCA) 20 MG TABS, Take 1 tablet (20 mg total) by mouth daily., Disp: 14 tablet, Rfl:  .  traZODone  (DESYREL) 50 MG tablet, Take 1 tablet (50 mg total) by mouth at bedtime. DO NOT TAKE THE SAME DAY YOU TAKE AMBIEN, Disp: 50 tablet, Rfl: 1

## 2015-10-29 NOTE — Assessment & Plan Note (Signed)
Continue 6 L of oxygen continuously 

## 2015-10-29 NOTE — Assessment & Plan Note (Signed)
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 She has been turned down by multiple centers for lung transplantation because of vascular disease. Her symptoms have progressed to the point of needing hospice at this point. I advised her today that she should continue taking the CellCept as prescribed in addition to the prednisone and Bactrim. However, we may be nearing a point where we need to consider using morphine because of her profound dyspnea.

## 2015-10-29 NOTE — Assessment & Plan Note (Addendum)
Because she has struggled with multiple diuretics (some causing hospital visit for hyponatremia, others being ineffective), long-term edema, and significant dyspnea I explained to her today that it may be worth a therapeutic trial of a PDE 5 inhibitor to see if causing some vasodilatation will improve her symptoms. I'm reluctant to try other loop diuretics or other forms of diuretic therapy because of the complication she's had recently.  I explained to her and her family the following.  We are going to start a medication for your pulmonary hypertension called Adcirca.  You should never take this medication with nitroglycerin or any medication class called nitrates.  We may need to adjust the dose of this medication if your kidney function is abnormal.  The starting dose of this medication is 20 mg daily, we may increase it to 40 mg daily if you are doing well. The most common side effect is low blood pressure, so if you feel dizzy or lightheaded please let us know.  We will monitor your kidney and liver function tests every other week for the first month followed by testing every month for the first 3 months. After that monitor these tests every 3 months.  After discussion they decided to proceed with this. She was given a sample today in the office and she will take the first dose tomorrow.  She will continue taking diuretics as prescribed, specifically Lasix twice a day She is going to use compression stockings as well.

## 2015-10-29 NOTE — Assessment & Plan Note (Signed)
This problem has improved since adjusting the diuretics in Hospital. We will check a basic metabolic panel today to follow-up as she is still on diuretic therapy.

## 2015-10-29 NOTE — Patient Instructions (Signed)
I want you to call Dr. early to find out if it's okay to use the compression stockings Try taking trazodone 50 mg every night as needed for insomnia, do not take it on the same nights when you take Ambien Keep taking the furosemide as you're doing We will call you with the results of today's lab work Take 1 pill of Adcirca 20 mg daily, watch out for lightheadedness, visual changes, or dizziness Call me next week to let me know how you are doing with this new medication I will see you back as previously arranged

## 2015-11-02 ENCOUNTER — Other Ambulatory Visit: Payer: Self-pay

## 2015-11-02 ENCOUNTER — Other Ambulatory Visit (INDEPENDENT_AMBULATORY_CARE_PROVIDER_SITE_OTHER): Payer: Medicare Other

## 2015-11-02 DIAGNOSIS — R7989 Other specified abnormal findings of blood chemistry: Secondary | ICD-10-CM | POA: Diagnosis not present

## 2015-11-02 DIAGNOSIS — R945 Abnormal results of liver function studies: Secondary | ICD-10-CM

## 2015-11-02 LAB — HEPATIC FUNCTION PANEL
ALBUMIN: 3.6 g/dL (ref 3.5–5.2)
ALK PHOS: 299 U/L — AB (ref 39–117)
ALT: 37 U/L — ABNORMAL HIGH (ref 0–35)
AST: 30 U/L (ref 0–37)
BILIRUBIN DIRECT: 0.9 mg/dL — AB (ref 0.0–0.3)
TOTAL PROTEIN: 6.3 g/dL (ref 6.0–8.3)
Total Bilirubin: 1.8 mg/dL — ABNORMAL HIGH (ref 0.2–1.2)

## 2015-11-09 ENCOUNTER — Other Ambulatory Visit (INDEPENDENT_AMBULATORY_CARE_PROVIDER_SITE_OTHER)

## 2015-11-09 ENCOUNTER — Telehealth: Payer: Self-pay | Admitting: Pulmonary Disease

## 2015-11-09 DIAGNOSIS — R7989 Other specified abnormal findings of blood chemistry: Secondary | ICD-10-CM | POA: Diagnosis not present

## 2015-11-09 DIAGNOSIS — I272 Pulmonary hypertension, unspecified: Secondary | ICD-10-CM

## 2015-11-09 DIAGNOSIS — R945 Abnormal results of liver function studies: Secondary | ICD-10-CM

## 2015-11-09 LAB — HEPATIC FUNCTION PANEL
ALK PHOS: 262 U/L — AB (ref 39–117)
ALT: 29 U/L (ref 0–35)
AST: 24 U/L (ref 0–37)
Albumin: 3.9 g/dL (ref 3.5–5.2)
BILIRUBIN DIRECT: 0.8 mg/dL — AB (ref 0.0–0.3)
TOTAL PROTEIN: 6.8 g/dL (ref 6.0–8.3)
Total Bilirubin: 1.7 mg/dL — ABNORMAL HIGH (ref 0.2–1.2)

## 2015-11-09 NOTE — Telephone Encounter (Signed)
Pt requesting a rx for a lift chair-pt uses [facility].    BQ please advise if you're ok with this rx.  Thanks.

## 2015-11-10 NOTE — Telephone Encounter (Signed)
OK by me 

## 2015-11-11 ENCOUNTER — Telehealth: Payer: Self-pay | Admitting: Pulmonary Disease

## 2015-11-11 NOTE — Telephone Encounter (Signed)
Called # provided and it was Varney Biles that called. Was given # to call [phone]. Spoke with [employer]. She reports pt was given samples of Adcirca and wanted RX for this. Pt denied any side effects from this medication.  Please advise Dr. Lake Bells thanks

## 2015-11-11 NOTE — Telephone Encounter (Signed)
Lift chair ordered through DME.  Pt aware of order being placed.  Nothing further needed.

## 2015-11-12 ENCOUNTER — Telehealth: Payer: Self-pay | Admitting: Pulmonary Disease

## 2015-11-12 MED ORDER — TADALAFIL (PAH) 20 MG PO TABS: 20.0000 mg | ORAL_TABLET | Freq: Every day | ORAL | Status: DC

## 2015-11-12 NOTE — Telephone Encounter (Signed)
Yes ok to refill 20mg  daily Offer another bottle of it from my office supply

## 2015-11-12 NOTE — Telephone Encounter (Signed)
Pt aware that Rx refilled for Adcirca 20mg  to [facility] Sample placed up front. Nothing further needed.

## 2015-11-12 NOTE — Telephone Encounter (Signed)
Duplicate message. Nothing further needed.

## 2015-11-21 ENCOUNTER — Other Ambulatory Visit: Payer: Self-pay | Admitting: Pulmonary Disease

## 2015-11-22 ENCOUNTER — Telehealth: Payer: Self-pay | Admitting: *Deleted

## 2015-11-22 NOTE — Telephone Encounter (Signed)
Submitted PA for Adcirca thru [facility]. Key: [place] for review

## 2015-11-23 ENCOUNTER — Other Ambulatory Visit: Payer: Self-pay | Admitting: Internal Medicine

## 2015-11-23 ENCOUNTER — Other Ambulatory Visit (INDEPENDENT_AMBULATORY_CARE_PROVIDER_SITE_OTHER)

## 2015-11-23 DIAGNOSIS — J849 Interstitial pulmonary disease, unspecified: Secondary | ICD-10-CM | POA: Diagnosis not present

## 2015-11-23 LAB — HEPATIC FUNCTION PANEL
ALK PHOS: 180 U/L — AB (ref 39–117)
ALT: 23 U/L (ref 0–35)
AST: 34 U/L (ref 0–37)
Albumin: 4.4 g/dL (ref 3.5–5.2)
BILIRUBIN DIRECT: 0.5 mg/dL — AB (ref 0.0–0.3)
TOTAL PROTEIN: 7.2 g/dL (ref 6.0–8.3)
Total Bilirubin: 1.6 mg/dL — ABNORMAL HIGH (ref 0.2–1.2)

## 2015-11-24 ENCOUNTER — Telehealth: Payer: Self-pay | Admitting: Pulmonary Disease

## 2015-11-24 MED ORDER — TADALAFIL (PAH) 20 MG PO TABS: 20.0000 mg | ORAL_TABLET | Freq: Every day | ORAL | Status: AC

## 2015-11-24 NOTE — Telephone Encounter (Signed)
Called hospice, pt need refill on adcirca.  This has been sent to preferred pharmacy.  Nothing further needed.

## 2015-11-24 NOTE — Telephone Encounter (Signed)
Adcirca approved through 05/21/2016. [id number] Pt ID: [id number]

## 2015-11-24 NOTE — Telephone Encounter (Signed)
Called [facility] and answered clinical questions  Adcirca 20 mg daily approved under her part D benefit until 05/21/16  Called to inform the pt and had to [facility]

## 2015-11-24 NOTE — Telephone Encounter (Signed)
Called and spoke to pt. Informed her of the approval of the medication. Pt verbalized understanding and denied any further questions or concerns at this time.    Will forward to [place] as FYI.

## 2015-11-24 NOTE — Telephone Encounter (Signed)
Patient Returned call [phone]

## 2015-11-29 ENCOUNTER — Other Ambulatory Visit (INDEPENDENT_AMBULATORY_CARE_PROVIDER_SITE_OTHER)

## 2015-11-29 ENCOUNTER — Telehealth: Payer: Self-pay | Admitting: Pulmonary Disease

## 2015-11-29 ENCOUNTER — Ambulatory Visit (INDEPENDENT_AMBULATORY_CARE_PROVIDER_SITE_OTHER): Payer: Medicare Other | Admitting: Pulmonary Disease

## 2015-11-29 ENCOUNTER — Encounter: Payer: Self-pay | Admitting: Pulmonary Disease

## 2015-11-29 DIAGNOSIS — I272 Other secondary pulmonary hypertension: Secondary | ICD-10-CM | POA: Diagnosis not present

## 2015-11-29 DIAGNOSIS — E876 Hypokalemia: Secondary | ICD-10-CM

## 2015-11-29 LAB — BASIC METABOLIC PANEL
BUN: 42 mg/dL — AB (ref 6–23)
CHLORIDE: 60 meq/L — AB (ref 96–112)
CO2: 38 meq/L — AB (ref 19–32)
Calcium: 9.6 mg/dL (ref 8.4–10.5)
Creatinine, Ser: 0.95 mg/dL (ref 0.40–1.20)
GFR: 61.92 mL/min (ref >60.00)
Glucose, Bld: 214 mg/dL — ABNORMAL HIGH (ref 70–99)
POTASSIUM: 2.2 meq/L — AB (ref 3.5–5.1)
Sodium: 114 mEq/L — CL (ref 135–145)

## 2015-11-29 MED ORDER — POTASSIUM CHLORIDE ER 8 MEQ PO TBCR: 16.0000 meq | EXTENDED_RELEASE_TABLET | Freq: Two times a day (BID) | ORAL | Status: DC

## 2015-11-29 NOTE — Assessment & Plan Note (Addendum)
[name] [name] has been tolerating Adcirca without too much difficulty.  I do worry that some of the unsteadiness she has experienced is orthostasis, but her family does not think so.  Plan: Continue Adcirca, I don't feel like we can push the dose any higher with her unsteadiness BMET today and again in one month

## 2015-11-29 NOTE — Assessment & Plan Note (Signed)
She has advanced, end-stage disease. It's difficult to gauge whether or not her symptoms have worsened which her family indicate as she remains very positive. However, her weight loss is very concerning and I expressed to her family today that I think that her time is measured in weeks to months. She may need to transition to inpatient hospice over the next several weeks. At this time, we will continue current management.

## 2015-11-29 NOTE — Progress Notes (Signed)
Subjective:    Patient ID: [name] [name], female    DOB: April 28, 1946, 70 y.o.   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 Gwenette [name] with fibrosing NSIP PFT's 03/2013:  No obstruction, TLC 66% pred, DLCO 29% with partial correction with Av.  CXR 11/2012: mild cm, no acute process.  Muscle pressures 04/2013 normal HRCT 04/2013:  IS/GG changes with nonspecific pattern  Autoimmune panel 06/2013: normal VATS bx 07/2013:  Not c/w UIP, most c/w fibrosing variant of NSIP.  Has scattered multinucleated giant cells. Pt denies any exposure hx, unusual pets, unusual hobbies. HP panel 08/2013:  Not overly impressive.  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  Pt has done an environmental survey at home.  02/2014:  New complaint of joint swelling and pain >> refer to rheum [name] [name]).  Cellcept started 09/15/14 She had a lung transplant evaluation at [facility] in 2016 but failed transplant evaluation due to peripheral vascular disease.   HPI Chief Complaint  Patient presents with  . Follow-up    pt states she is at baseline today.  Adcirca has decreased leg swelling.  Still having difficulty getting this filled.    [name] [name] has lost some weight since the last visit. The swelling has improved somewhat.  Its' better than before, with some activities of daily living she feels a little better. Her daughter says that she has been more unstable in the last few weeks.  It occurs more after sleeping and does seem to occur after getting up.  Family has noticed mild disorientation when she gets up.  She thinks that pert of her weight loss is muscle mass in addition to the leg swelling. She has not been eating consistently lately either.  She had some nausea before as well. She missed a few doses of medicine last week. She continues to use oxygen at 6 L O2.  Family feels like she is groggy and sometimes confused in the mornings. She has been sleeping more. She is taking ambien at night.  Past Medical History    Diagnosis Date  . Peripheral arterial disease ([place])   . Hyperlipidemia   . Thyroid disease     Hypothyroidism  . Hypothyroidism   . Blood transfusion     2 /12 yrs ago  . Arthritis   . Arrhythmia     when hyperthyroid  . Dyspnea on exertion 01/22/2013  . GERD (gastroesophageal reflux disease)     otc  . Carotid artery occlusion       Review of Systems  Constitutional: Negative for fever, chills and fatigue.  HENT: Negative for nosebleeds, postnasal drip and rhinorrhea.   Respiratory: Positive for cough and shortness of breath. Negative for wheezing.   Cardiovascular: Negative for chest pain, palpitations and leg swelling.       Objective:   Physical Exam Filed Vitals:   11/29/15 1349  BP: 114/64  Pulse: 62  Height: 5\' 2"  (1.575 m)  Weight: 93 lb 6.4 oz (42.366 kg)  SpO2: 98%   6 L [place]  Gen: chronically ill appearing HENT: OP clear, TM's clear, neck supple PULM: Crackles bilaterally, normal percussion CV: RRR, no mgr, no leg edema! GI: BS+, soft, nontender Derm: no cyanosis or rash Psyche: normal mood and affect  2016 [facility]: RA: 12 RV: 68 12 14  PA: 68 26 41 PCW: 22 23 22  Oximetry Site Hgb (g/dL) O2 sat (%)  FA: 14.1 97.3  PA: 14.3 70.3  PA: 14.3 72.1  Calculations O2  consumption: 164.1 ML O2/min (assumed fick) EPBF (Qep): 3.3 L/min  Mean Hgb: 14.2 g/dl Shunt ratio: 1.0 (Qp/Qs)  AV O2: 5.0 Vol% L to R shunt: 0.0 L/min (Qp-Qep)  PBF (Qp): 3.3 L/min R to L shunt: 0.0 L/min (Qs-Qep)  Cardiac output (Qs): 3.3 L/min  Cardiac Index: 2.2 L/min/m2  PVR: 5.8 wood units  SVR: 29.5 wood units   Her hospital records from last week were reviewed where she was admitted for hyponatremia. She was treated with saline initially and then eventually put back on diuretics by hospital discharge.  BMET    Component Value Date/Time   NA 130* 10/29/2015 1525   K 4.4 10/29/2015 1525   CL 90* 10/29/2015 1525   CO2 26 10/29/2015 1525   GLUCOSE 221* 10/29/2015 1525    BUN 25* 10/29/2015 1525   CREATININE 1.11 10/29/2015 1525   CALCIUM 9.4 10/29/2015 1525   GFRNONAA >60 10/23/2015 0531   GFRAA >60 10/23/2015 0531        Assessment & Plan:  Pulmonary hypertension ([place]) [name] [name] has been tolerating Adcirca without too much difficulty.  I do worry that some of the unsteadiness she has experienced is orthostasis, but her family does not think so.  Plan: Continue Adcirca, I don't feel like we can push the dose any higher with her unsteadiness BMET today and again in one month   Interstitial lung disease ([place]) She has advanced, end-stage disease. It's difficult to gauge whether or not her symptoms have worsened which her family indicate as she remains very positive. However, her weight loss is very concerning and I expressed to her family today that I think that her time is measured in weeks to months. She may need to transition to inpatient hospice over the next several weeks. At this time, we will continue current management.  > 25 minutes spent in direct consultation as part of a 41 minute visit   Current outpatient prescriptions:  .  ALPRAZolam (XANAX) 0.25 MG tablet, Take 0.25 mg by mouth 2 (two) times daily as needed. for anxiety prn only take twice a month, Disp: , Rfl: 1 .  aspirin EC 81 MG tablet, Take 81 mg by mouth at bedtime., Disp: , Rfl:  .  Elastic Bandages & Supports (MEDICAL COMPRESSION THIGH HIGH) MISC, 30mmhg -wear daily, Disp: 2 each, Rfl: 0 .  furosemide (LASIX) 20 MG tablet, TAKE 2 TABLETS IN THE MORNING AND 2 IN THE AFTERNOON., Disp: 120 tablet, Rfl: 1 .  levothyroxine (SYNTHROID, LEVOTHROID) 75 MCG tablet, Take 75 mcg by mouth daily before breakfast., Disp: , Rfl:  .  mycophenolate (CELLCEPT) 500 MG tablet, Take 2 tablets (1,000 mg total) by mouth 2 (two) times daily., Disp: 120 tablet, Rfl: 5 .  predniSONE (DELTASONE) 10 MG tablet, TAKE AS DIRECTED, Disp: 45 tablet, Rfl: 5 .  sulfamethoxazole-trimethoprim (BACTRIM DS,SEPTRA DS)  800-160 MG tablet, Take 1 tablet by mouth 3 (three) times a week., Disp: 12 tablet, Rfl: 5 .  traZODone (DESYREL) 50 MG tablet, Take 1 tablet (50 mg total) by mouth at bedtime. DO NOT TAKE THE SAME DAY YOU TAKE AMBIEN, Disp: 50 tablet, Rfl: 1 .  zolpidem (AMBIEN) 5 MG tablet, Take 5 mg by mouth at bedtime as needed for sleep., Disp: , Rfl:  .  Tadalafil, PAH, (ADCIRCA) 20 MG TABS, Take 1 tablet (20 mg total) by mouth daily. (Patient not taking: Reported on 11/29/2015), Disp: 30 tablet, Rfl: 6

## 2015-11-29 NOTE — Telephone Encounter (Signed)
Lysle Morales returned call and stated that the patient needed refills on her lasix and tadalafil. I explained to her that the lasix was refilled on 11/22/15 and the Tadalafil was refilled on 11/24/15. She stated that she would contact the patient and inform her they were at the pharmacy. Lysle Morales voiced understanding and had no further questions. Nothing further needed at this time.

## 2015-11-29 NOTE — Telephone Encounter (Signed)
LM for [name] [name], Patient's primary Hospice Nurse

## 2015-11-29 NOTE — Telephone Encounter (Signed)
[name] [name]'s labs came back today showing a severely low sodium and a severely low potassium. This likely explains some of the confusion she's been experiencing recently. I also think that the fact that she was not taking many of her medications last week and not eating is also contributing.  I talked to her daughter-in-law at length today who is her primary caretaker. I explained that her sodium this low can lead to seizures and other problems, but we've been down this pathway with [name] [name] before she was recently hospitalized in December for similar symptoms and lab work.  Considering the fact that she's on hospice, it's not clear to me that we necessarily need to admit [name] [name] right now. I explained this to her daughter-in-law and she agreed that we should try to keep her out of the hospital this time because she's been very frail and weak recently.  Plan: Stop Lasix Take 16 mEq potassium twice a day Repeat basic metabolic panel Monday morning  I instructed her daughter-in-law to take her to the hospital if her mental status worsens.  She is OK with this plan.  [name] Awkward, MD [place] [facility] [facility] Pager: [phone] Cell: [phone] After 3pm or if no response, call [phone]

## 2015-11-29 NOTE — Patient Instructions (Signed)
Continue taking your medications as you are doing We will call you with the results of today's blood work Trying using trazadone 100mg  in the evening instead of the ambien F/u 4-6 weeks or sooner if needed

## 2015-12-01 ENCOUNTER — Other Ambulatory Visit (INDEPENDENT_AMBULATORY_CARE_PROVIDER_SITE_OTHER)

## 2015-12-01 ENCOUNTER — Telehealth: Payer: Self-pay | Admitting: Pulmonary Disease

## 2015-12-01 DIAGNOSIS — I272 Other secondary pulmonary hypertension: Secondary | ICD-10-CM | POA: Diagnosis not present

## 2015-12-01 DIAGNOSIS — E876 Hypokalemia: Secondary | ICD-10-CM

## 2015-12-01 LAB — BASIC METABOLIC PANEL
BUN: 25 mg/dL — AB (ref 6–23)
CALCIUM: 9.5 mg/dL (ref 8.4–10.5)
CO2: 39 meq/L — AB (ref 19–32)
CREATININE: 0.65 mg/dL (ref 0.40–1.20)
Chloride: 73 mEq/L — ABNORMAL LOW (ref 96–112)
GFR: 95.94 mL/min (ref >60.00)
GLUCOSE: 114 mg/dL — AB (ref 70–99)
Potassium: 2.9 mEq/L — ABNORMAL LOW (ref 3.5–5.1)
SODIUM: 124 meq/L — AB (ref 135–145)

## 2015-12-01 MED ORDER — POTASSIUM CHLORIDE ER 8 MEQ PO TBCR: 16.0000 meq | EXTENDED_RELEASE_TABLET | Freq: Two times a day (BID) | ORAL | Status: DC

## 2015-12-01 NOTE — Telephone Encounter (Signed)
Called and spoke to [place]. Varney Biles asked if pt needed to go to hospital based on lab results, advised her no she does not per BQ's results and recs from labs today 12/01/2015. Called and spoke to pt's daughter, Wells Guiles, and informed her of the results and recs per BQ. Wells Guiles stated she will need more potassium to last pt till Monday. Rx sent to preferred pharmacy. Pt verbalized understanding and denied any further questions or concerns at this time.     Notes Recorded by Juanito Doom, MD on 12/01/2015 at 4:19 PM All, Please let her know that this is getting better. However, I want her to keep taking potassium twice a day as she has been doing. No more lasix either. On Monday she should get a BMET again and I will give them further instructions then. Thanks B

## 2015-12-01 NOTE — Telephone Encounter (Signed)
ATC Keisha, VM is full and could not leave VM. WCB.

## 2015-12-01 NOTE — Telephone Encounter (Signed)
Return call.[name] [name] °

## 2015-12-05 ENCOUNTER — Other Ambulatory Visit: Payer: Self-pay | Admitting: Pulmonary Disease

## 2015-12-06 ENCOUNTER — Other Ambulatory Visit (INDEPENDENT_AMBULATORY_CARE_PROVIDER_SITE_OTHER)

## 2015-12-06 ENCOUNTER — Telehealth: Payer: Self-pay | Admitting: Pulmonary Disease

## 2015-12-06 DIAGNOSIS — M7989 Other specified soft tissue disorders: Secondary | ICD-10-CM

## 2015-12-06 DIAGNOSIS — E876 Hypokalemia: Secondary | ICD-10-CM

## 2015-12-06 DIAGNOSIS — I272 Other secondary pulmonary hypertension: Secondary | ICD-10-CM

## 2015-12-06 LAB — HEPATIC FUNCTION PANEL
ALBUMIN: 3.7 g/dL (ref 3.5–5.2)
ALT: 61 U/L — AB (ref 0–35)
AST: 44 U/L — ABNORMAL HIGH (ref 0–37)
Alkaline Phosphatase: 204 U/L — ABNORMAL HIGH (ref 39–117)
BILIRUBIN TOTAL: 0.9 mg/dL (ref 0.2–1.2)
Bilirubin, Direct: 0.4 mg/dL — ABNORMAL HIGH (ref 0.0–0.3)
TOTAL PROTEIN: 6.5 g/dL (ref 6.0–8.3)

## 2015-12-06 LAB — BASIC METABOLIC PANEL
BUN: 19 mg/dL (ref 6–23)
CALCIUM: 9.3 mg/dL (ref 8.4–10.5)
CO2: 24 mEq/L (ref 19–32)
Chloride: 97 mEq/L (ref 96–112)
Creatinine, Ser: 0.72 mg/dL (ref 0.40–1.20)
GFR: 85.25 mL/min (ref >60.00)
Glucose, Bld: 147 mg/dL — ABNORMAL HIGH (ref 70–99)
POTASSIUM: 5 meq/L (ref 3.5–5.1)
SODIUM: 132 meq/L — AB (ref 135–145)

## 2015-12-06 MED ORDER — POTASSIUM CHLORIDE ER 10 MEQ PO TBCR: 10.0000 meq | EXTENDED_RELEASE_TABLET | Freq: Every day | ORAL | Status: AC

## 2015-12-06 NOTE — Telephone Encounter (Signed)
I called her daughter and reviewed the BMET result.  I explained that her potassium level was OK.  I asked that the resume lasix 20mg  once per day with one potassium (10 mEq) daily.  BMET again in one week  Roselie Awkward, MD [place] [facility] Pager: [phone] Cell: [phone] After 3pm or if no response, call [phone]

## 2015-12-07 NOTE — Addendum Note (Signed)
Addended by: Len Blalock on: 12/07/2015 03:32 PM   Modules accepted: Orders

## 2015-12-09 ENCOUNTER — Encounter (HOSPITAL_COMMUNITY): Payer: Self-pay | Admitting: Emergency Medicine

## 2015-12-09 ENCOUNTER — Inpatient Hospital Stay (HOSPITAL_COMMUNITY)

## 2015-12-09 ENCOUNTER — Emergency Department (HOSPITAL_COMMUNITY)

## 2015-12-09 ENCOUNTER — Inpatient Hospital Stay (HOSPITAL_COMMUNITY)
Admission: EM | Admit: 2015-12-09 | Discharge: 2015-12-10 | DRG: 091 | Disposition: A | Discharge status: Hospice | Attending: Internal Medicine | Admitting: Internal Medicine

## 2015-12-09 ENCOUNTER — Telehealth: Payer: Self-pay | Admitting: Pulmonary Disease

## 2015-12-09 DIAGNOSIS — Z66 Do not resuscitate: Secondary | ICD-10-CM | POA: Diagnosis present

## 2015-12-09 DIAGNOSIS — E785 Hyperlipidemia, unspecified: Secondary | ICD-10-CM | POA: Diagnosis present

## 2015-12-09 DIAGNOSIS — Z87891 Personal history of nicotine dependence: Secondary | ICD-10-CM | POA: Diagnosis not present

## 2015-12-09 DIAGNOSIS — E875 Hyperkalemia: Secondary | ICD-10-CM | POA: Diagnosis present

## 2015-12-09 DIAGNOSIS — I739 Peripheral vascular disease, unspecified: Secondary | ICD-10-CM | POA: Diagnosis present

## 2015-12-09 DIAGNOSIS — E039 Hypothyroidism, unspecified: Secondary | ICD-10-CM | POA: Diagnosis present

## 2015-12-09 DIAGNOSIS — R10A1 Flank pain, right side: Secondary | ICD-10-CM | POA: Diagnosis present

## 2015-12-09 DIAGNOSIS — I272 Other secondary pulmonary hypertension: Secondary | ICD-10-CM | POA: Diagnosis present

## 2015-12-09 DIAGNOSIS — K219 Gastro-esophageal reflux disease without esophagitis: Secondary | ICD-10-CM | POA: Diagnosis present

## 2015-12-09 DIAGNOSIS — E440 Moderate protein-calorie malnutrition: Secondary | ICD-10-CM | POA: Diagnosis not present

## 2015-12-09 DIAGNOSIS — R600 Localized edema: Secondary | ICD-10-CM | POA: Diagnosis present

## 2015-12-09 DIAGNOSIS — T43215A Adverse effect of selective serotonin and norepinephrine reuptake inhibitors, initial encounter: Secondary | ICD-10-CM | POA: Diagnosis present

## 2015-12-09 DIAGNOSIS — G9340 Encephalopathy, unspecified: Secondary | ICD-10-CM | POA: Diagnosis not present

## 2015-12-09 DIAGNOSIS — R1011 Right upper quadrant pain: Secondary | ICD-10-CM | POA: Diagnosis present

## 2015-12-09 DIAGNOSIS — Z515 Encounter for palliative care: Secondary | ICD-10-CM | POA: Diagnosis not present

## 2015-12-09 DIAGNOSIS — Z79899 Other long term (current) drug therapy: Secondary | ICD-10-CM | POA: Diagnosis not present

## 2015-12-09 DIAGNOSIS — J849 Interstitial pulmonary disease, unspecified: Secondary | ICD-10-CM | POA: Diagnosis present

## 2015-12-09 DIAGNOSIS — J811 Chronic pulmonary edema: Secondary | ICD-10-CM | POA: Diagnosis present

## 2015-12-09 DIAGNOSIS — E0590 Thyrotoxicosis, unspecified without thyrotoxic crisis or storm: Secondary | ICD-10-CM | POA: Diagnosis present

## 2015-12-09 DIAGNOSIS — Z807 Family history of other malignant neoplasms of lymphoid, hematopoietic and related tissues: Secondary | ICD-10-CM

## 2015-12-09 DIAGNOSIS — Z7189 Other specified counseling: Secondary | ICD-10-CM | POA: Diagnosis not present

## 2015-12-09 DIAGNOSIS — Z8249 Family history of ischemic heart disease and other diseases of the circulatory system: Secondary | ICD-10-CM

## 2015-12-09 DIAGNOSIS — G92 Toxic encephalopathy: Secondary | ICD-10-CM | POA: Diagnosis present

## 2015-12-09 DIAGNOSIS — Z803 Family history of malignant neoplasm of breast: Secondary | ICD-10-CM | POA: Diagnosis not present

## 2015-12-09 DIAGNOSIS — Z90710 Acquired absence of both cervix and uterus: Secondary | ICD-10-CM

## 2015-12-09 DIAGNOSIS — R109 Unspecified abdominal pain: Secondary | ICD-10-CM | POA: Diagnosis present

## 2015-12-09 DIAGNOSIS — J9621 Acute and chronic respiratory failure with hypoxia: Secondary | ICD-10-CM | POA: Diagnosis not present

## 2015-12-09 DIAGNOSIS — Z9181 History of falling: Secondary | ICD-10-CM

## 2015-12-09 DIAGNOSIS — J810 Acute pulmonary edema: Secondary | ICD-10-CM | POA: Diagnosis not present

## 2015-12-09 DIAGNOSIS — Z885 Allergy status to narcotic agent status: Secondary | ICD-10-CM

## 2015-12-09 DIAGNOSIS — R9089 Other abnormal findings on diagnostic imaging of central nervous system: Secondary | ICD-10-CM

## 2015-12-09 DIAGNOSIS — M1990 Unspecified osteoarthritis, unspecified site: Secondary | ICD-10-CM | POA: Diagnosis present

## 2015-12-09 DIAGNOSIS — Z681 Body mass index (BMI) 19 or less, adult: Secondary | ICD-10-CM | POA: Diagnosis not present

## 2015-12-09 DIAGNOSIS — Z9049 Acquired absence of other specified parts of digestive tract: Secondary | ICD-10-CM

## 2015-12-09 DIAGNOSIS — Z9981 Dependence on supplemental oxygen: Secondary | ICD-10-CM

## 2015-12-09 DIAGNOSIS — Z825 Family history of asthma and other chronic lower respiratory diseases: Secondary | ICD-10-CM | POA: Diagnosis not present

## 2015-12-09 LAB — COMPREHENSIVE METABOLIC PANEL
ALBUMIN: 3.7 g/dL (ref 3.5–5.0)
ALK PHOS: 212 U/L — AB (ref 38–126)
ALT: 68 U/L — ABNORMAL HIGH (ref 14–54)
ANION GAP: 11 (ref 5–15)
AST: 37 U/L (ref 15–41)
BUN: 21 mg/dL — ABNORMAL HIGH (ref 6–20)
CALCIUM: 8.8 mg/dL — AB (ref 8.9–10.3)
CHLORIDE: 102 mmol/L (ref 101–111)
CO2: 25 mmol/L (ref 22–32)
Creatinine, Ser: 0.81 mg/dL (ref 0.44–1.00)
GFR calc non Af Amer: >60 mL/min (ref >60)
Glucose, Bld: 81 mg/dL (ref 65–99)
Potassium: 4.4 mmol/L (ref 3.5–5.1)
SODIUM: 138 mmol/L (ref 135–145)
Total Bilirubin: 0.9 mg/dL (ref 0.3–1.2)
Total Protein: 6.4 g/dL — ABNORMAL LOW (ref 6.5–8.1)

## 2015-12-09 LAB — CBC
HCT: 43.2 % (ref 36.0–46.0)
HCT: 45.1 % (ref 36.0–46.0)
HEMOGLOBIN: 14.6 g/dL (ref 12.0–15.0)
Hemoglobin: 14.3 g/dL (ref 12.0–15.0)
MCH: 29 pg (ref 26.0–34.0)
MCH: 28.7 pg (ref 26.0–34.0)
MCHC: 33.1 g/dL (ref 30.0–36.0)
MCHC: 32.4 g/dL (ref 30.0–36.0)
MCV: 87.6 fL (ref 78.0–100.0)
MCV: 88.8 fL (ref 78.0–100.0)
Platelets: 181 K/uL (ref 150–400)
Platelets: 162 10*3/uL (ref 150–400)
RBC: 4.93 MIL/uL (ref 3.87–5.11)
RBC: 5.08 MIL/uL (ref 3.87–5.11)
RDW: 16.9 % — ABNORMAL HIGH (ref 11.5–15.5)
RDW: 16.9 % — ABNORMAL HIGH (ref 11.5–15.5)
WBC: 9.5 K/uL (ref 4.0–10.5)
WBC: 9.3 10*3/uL (ref 4.0–10.5)

## 2015-12-09 LAB — BLOOD GAS, ARTERIAL
ACID-BASE DEFICIT: 3.1 mmol/L — AB (ref 0.0–2.0)
Bicarbonate: 19.2 mEq/L — ABNORMAL LOW (ref 20.0–24.0)
DRAWN BY: 308601
O2 CONTENT: 8 L/min
O2 SAT: 96 %
PCO2 ART: 27.8 mmHg — AB (ref 35.0–45.0)
Patient temperature: 37
TCO2: 16.8 mmol/L (ref 0–100)
pH, Arterial: 7.453 — ABNORMAL HIGH (ref 7.350–7.450)
pO2, Arterial: 87.2 mmHg (ref 80.0–100.0)

## 2015-12-09 LAB — URINALYSIS, ROUTINE W REFLEX MICROSCOPIC
Bilirubin Urine: NEGATIVE
Glucose, UA: NEGATIVE mg/dL
HGB URINE DIPSTICK: NEGATIVE
Ketones, ur: NEGATIVE mg/dL
Leukocytes, UA: NEGATIVE
Nitrite: NEGATIVE
Protein, ur: NEGATIVE mg/dL
SPECIFIC GRAVITY, URINE: 1.007 (ref 1.005–1.030)
pH: 6.5 (ref 5.0–8.0)

## 2015-12-09 LAB — BASIC METABOLIC PANEL
Anion gap: 10 (ref 5–15)
BUN: 21 mg/dL — AB (ref 6–20)
CHLORIDE: 105 mmol/L (ref 101–111)
CO2: 19 mmol/L — AB (ref 22–32)
CREATININE: 0.76 mg/dL (ref 0.44–1.00)
Calcium: 8.8 mg/dL — ABNORMAL LOW (ref 8.9–10.3)
GFR calc non Af Amer: >60 mL/min (ref >60)
Glucose, Bld: 103 mg/dL — ABNORMAL HIGH (ref 65–99)
POTASSIUM: 5.6 mmol/L — AB (ref 3.5–5.1)
Sodium: 134 mmol/L — ABNORMAL LOW (ref 135–145)

## 2015-12-09 LAB — BRAIN NATRIURETIC PEPTIDE: B NATRIURETIC PEPTIDE 5: 2171.8 pg/mL — AB (ref 0.0–100.0)

## 2015-12-09 LAB — I-STAT TROPONIN, ED: Troponin i, poc: 0.04 ng/mL (ref 0.00–0.08)

## 2015-12-09 LAB — AMMONIA: AMMONIA: 16 umol/L (ref 9–35)

## 2015-12-09 MED ORDER — FUROSEMIDE 10 MG/ML IJ SOLN
40.0000 mg | Freq: Once | INTRAMUSCULAR | Status: AC
  Administered 2015-12-09: 40 mg via INTRAVENOUS
  Filled 2015-12-09: qty 4

## 2015-12-09 MED ORDER — SULFAMETHOXAZOLE-TRIMETHOPRIM 800-160 MG PO TABS
1.0000 | ORAL_TABLET | ORAL | Status: DC
  Administered 2015-12-10: 1 via ORAL
  Filled 2015-12-09: qty 1

## 2015-12-09 MED ORDER — POTASSIUM CHLORIDE ER 10 MEQ PO TBCR: 10.0000 meq | EXTENDED_RELEASE_TABLET | Freq: Every day | ORAL | Status: DC

## 2015-12-09 MED ORDER — SENNOSIDES-DOCUSATE SODIUM 8.6-50 MG PO TABS
1.0000 | ORAL_TABLET | Freq: Two times a day (BID) | ORAL | Status: DC
  Administered 2015-12-09 – 2015-12-10 (×3): 1 via ORAL
  Filled 2015-12-09 (×3): qty 1

## 2015-12-09 MED ORDER — ALPRAZOLAM 0.25 MG PO TABS
0.2500 mg | ORAL_TABLET | Freq: Two times a day (BID) | ORAL | Status: DC | PRN
  Administered 2015-12-09: 0.25 mg via ORAL
  Filled 2015-12-09: qty 1

## 2015-12-09 MED ORDER — PREDNISONE 5 MG PO TABS
5.0000 mg | ORAL_TABLET | Freq: Every day | ORAL | Status: DC
  Administered 2015-12-09 – 2015-12-10 (×2): 5 mg via ORAL
  Filled 2015-12-09: qty 1

## 2015-12-09 MED ORDER — MYCOPHENOLATE MOFETIL 250 MG PO CAPS
1000.0000 mg | ORAL_CAPSULE | Freq: Two times a day (BID) | ORAL | Status: DC
  Administered 2015-12-09 – 2015-12-10 (×3): 1000 mg via ORAL
  Filled 2015-12-09 (×4): qty 4

## 2015-12-09 MED ORDER — FUROSEMIDE 10 MG/ML IJ SOLN: 20.0000 mg | Freq: Every day | INTRAMUSCULAR | Status: DC

## 2015-12-09 MED ORDER — TRAMADOL HCL 50 MG PO TABS: 50.0000 mg | ORAL_TABLET | Freq: Two times a day (BID) | ORAL | Status: DC | PRN

## 2015-12-09 MED ORDER — LEVOTHYROXINE SODIUM 50 MCG PO TABS
75.0000 ug | ORAL_TABLET | Freq: Every day | ORAL | Status: DC
  Administered 2015-12-09 – 2015-12-10 (×2): 75 ug via ORAL
  Filled 2015-12-09 (×2): qty 1

## 2015-12-09 MED ORDER — TRAZODONE HCL 50 MG PO TABS
50.0000 mg | ORAL_TABLET | Freq: Every day | ORAL | Status: DC
  Administered 2015-12-09: 50 mg via ORAL
  Filled 2015-12-09: qty 1

## 2015-12-09 MED ORDER — NALOXONE HCL 0.4 MG/ML IJ SOLN
0.4000 mg | Freq: Once | INTRAMUSCULAR | Status: AC
  Administered 2015-12-09: 0.4 mg via INTRAVENOUS
  Filled 2015-12-09: qty 1

## 2015-12-09 MED ORDER — LIDOCAINE 5 % EX PTCH
1.0000 | MEDICATED_PATCH | CUTANEOUS | Status: DC
Start: 2015-12-09 — End: 2015-12-10
  Filled 2015-12-09 (×2): qty 1

## 2015-12-09 MED ORDER — ENSURE ENLIVE PO LIQD
237.0000 mL | Freq: Two times a day (BID) | ORAL | Status: DC
  Administered 2015-12-10: 237 mL via ORAL

## 2015-12-09 MED ORDER — PREDNISONE 5 MG PO TABS
10.0000 mg | ORAL_TABLET | Freq: Every day | ORAL | Status: DC
  Administered 2015-12-09 – 2015-12-10 (×2): 10 mg via ORAL
  Filled 2015-12-09 (×2): qty 2

## 2015-12-09 MED ORDER — TADALAFIL (PAH) 20 MG PO TABS
20.0000 mg | ORAL_TABLET | Freq: Every day | ORAL | Status: DC
  Administered 2015-12-09 – 2015-12-10 (×2): 20 mg via ORAL

## 2015-12-09 NOTE — Progress Notes (Signed)
Initial Nutrition Assessment  DOCUMENTATION CODES:   Non-severe (moderate) malnutrition in context of acute illness/injury  INTERVENTION:  - RD will continue to monitor for needs  NUTRITION DIAGNOSIS:   Unintentional weight loss related to other (see comment) (SOB versus fluid weight loss) as evidenced by percent weight loss.  GOAL:   Patient will meet greater than or equal to 90% of their needs  MONITOR:   PO intake, Weight trends, Labs, I & O's  REASON FOR ASSESSMENT:   Malnutrition Screening Tool  ASSESSMENT:   70 y.o. female with history of pulmonary hypertension and interstitial lung disease, hypothyroidism and peripheral arterial disease who was brought to the ER after patient started complaining of right flank pain last midnight. In the ER patient was found to be confused and hypoxic. Patient is usually on 6 L of oxygen and had required nonrebreather. Chest x-ray was showing congestion. CT head shows nonspecific changes which may require MRI of the brain for further assessment. As per patient's daughter patient is under hospice and over the last 2 days was given pain medication for generalized body ache and had taken hydrocodone. In the ER after getting Narcan 0.4 mg IV 1 dose patient's mental status improved. Patient's oxygen requirement decreased after Lasix 40 mg IV was given and patient had 850 a.m. on urine output.   Pt seen for MST. BMI indicates normal weight. Pt states that for breakfast this AM she had peaches, a bagel, and an omelet and ate 100% of this meal. Pt denies chewing or swallowing issues this AM or PTA. She also denies abdominal pain or nausea with intakes this AM or PTA. Pt and family member, who is at bedside, report that SOB occurs from 2000-0000 most days and does not affect pt during the day. Pt denies SOB interfering with ability to consume PO. Pt and family member report pt has a very good appetite at baseline.   Per family member, pt's UBW is 110 lbs  and pt had been stable at this weight until December 2016. She has since lost a significant amount of weight. Of note, pt was on Lasix and another diuretic was added to this regimen PTA. Per chart review, pt has lost 11 lbs (10% body weight) in the past 2 months which is significant for time frame. Will continue to monitor weight trends. No muscle wasting noted, mild fat wasting to upper body noted.   Pt likely meeting needs. Will continue to monitor for additional needs. Medications reviewed. Labs reviewed; Na: 134 mmol/L, K: 5.6 mmol/L, BUN: 21 mg/dL, Ca: 8.8 mg/dL, LFTs elevated and trending up.   Diet Order:  Diet Heart Room service appropriate?: Yes; Fluid consistency:: Thin  Skin:  Reviewed, no issues  Last BM:  PTA  Height:   Ht Readings from Last 1 Encounters:  12/09/15 5' (1.524 m)    Weight:   Wt Readings from Last 1 Encounters:  12/09/15 99 lb 3.3 oz (45 kg)    Ideal Body Weight:  45.45 kg (kg)  BMI:  Body mass index is 19.38 kg/(m^2).  Estimated Nutritional Needs:   Kcal:  1130-1350 (25-30 kcal/kg)  Protein:  45-55 grams  Fluid:  >/= 1.5 L/day  EDUCATION NEEDS:   No education needs identified at this time     Jarome Matin, RD, LDN Inpatient Clinical Dietitian Pager # [phone] After hours/weekend pager # [phone]

## 2015-12-09 NOTE — H&P (Addendum)
[facility] History and Physical  [name] [name] [id number] DOB: 1946/05/20 DOA: 12/09/2015  Referring physician: Dr.Palombo. PCP: Tivis Ringer, MD  Specialists: [id number]. Pulmonologist.  Chief Complaint: Right flank pain and confusion.  HPI: [name] [name] is a 70 y.o. female with history of pulmonary hypertension and interstitial lung disease, hypothyroidism and peripheral arterial disease who was brought to the ER after patient started complaining of right flank pain last midnight. In the ER patient was found to be confused and hypoxic. Patient is usually on 6 L of oxygen and had required nonrebreather. Chest x-ray was showing congestion. CT head shows nonspecific changes which may require MRI of the brain for further assessment. As per patient's daughter patient is under hospice and over the last 2 days was given pain medication for generalized body ache and had taken hydrocodone. In the ER after getting Narcan 0.4 mg IV 1 dose patient's mental status improved. Patient's oxygen requirement decreased after Lasix 40 mg IV was given and patient had 850 a.m. on urine output. Patient will be admitted for further management. On exam patient does have lower extremity edema and right flank tenderness. As per patient daughter patient did have a fall 3 weeks ago. Denies any nausea vomiting or diarrhea. Denies any chest pain or productive cough.   Review of Systems: As presented in the history of presenting illness, rest negative.  Past Medical History  Diagnosis Date  . Peripheral arterial disease ([place])   . Hyperlipidemia   . Thyroid disease     Hypothyroidism  . Hypothyroidism   . Blood transfusion     2 /12 yrs ago  . Arthritis   . Arrhythmia     when hyperthyroid  . Dyspnea on exertion 01/22/2013  . GERD (gastroesophageal reflux disease)     otc  . Carotid artery occlusion    Past Surgical History  Procedure Laterality Date  . Pr vein bypass graft,aorto-fem-pop  03/02/09     Aortobifemoral BPG, Bilateral fem- popliteal BPG  . Hand surgery      cyst removed left hand  . Appendectomy      45 yrs ago  . Femoral-popliteal bypass graft  09/11/2011    Procedure: BYPASS GRAFT FEMORAL-POPLITEAL ARTERY;  Surgeon: Rosetta Posner, MD;  Location: [place];  Service: Vascular;  Laterality: Right;  Thrombectomy right femoral popliteal bypass with intraoperative arteriogram  . Embolectomy  09/12/2011    Procedure: EMBOLECTOMY;  Surgeon: Theotis Burrow, MD;  Location: [facility] OR;  Service: Vascular;  Laterality: Right;  Embolectomy Right Femoral -Popliteal Bypass Graft, Revision of Distal Anastomosis  . Femoral-popliteal bypass graft  09/11/2011    Procedure: BYPASS GRAFT FEMORAL-POPLITEAL ARTERY;  Surgeon: Rosetta Posner, MD;  Location: [place];  Service: Vascular;  Laterality: Right;  Thrombectomy right femoral popliteal bypass with intraoperative arteriogram  . Breast surgery      duct removed left breast  . Femoral-popliteal bypass graft  01/17/2012    Procedure: BYPASS GRAFT FEMORAL-POPLITEAL ARTERY;  Surgeon: Rosetta Posner, MD;  Location: [facility] OR;  Service: Vascular;  Laterality: Right;  right femoral to below knee popliteal bypass graft with vein  . Intraoperative arteriogram  01/17/2012    Procedure: INTRA OPERATIVE ARTERIOGRAM;  Surgeon: Rosetta Posner, MD;  Location: [facility] OR;  Service: Vascular;  Laterality: Right;  . Abdominal hysterectomy    . Femoral-popliteal bypass graft  06/17/2012    Procedure: BYPASS GRAFT FEMORAL-POPLITEAL ARTERY;  Surgeon: Rosetta Posner, MD;  Location: [place];  Service:  Vascular;  Laterality: Right;  revision  . Cardiovascular stress test  May 2014  . Pulmonary stress test  July 2014  . Video assisted thoracoscopy Left 08/11/2013    Procedure: VIDEO ASSISTED THORACOSCOPY;  Surgeon: Melrose Nakayama, MD;  Location: [place];  Service: Thoracic;  Laterality: Left;  LEFT  VATS, LUNG BX  . Lung biopsy  Oct. 2014  . Cataract extraction    . Eye surgery    . Abdominal  aortagram N/A 09/08/2011    Procedure: ABDOMINAL Maxcine Ham;  Surgeon: Elam Dutch, MD;  [facility] C[facility] LAB;  Service: Cardiovascular;  Laterality: N/A;  . Lower extremity angiogram N/A 09/08/2011    Procedure: LOWER EXTREMITY ANGIOGRAM;  Surgeon: Elam Dutch, MD;  Location: [facility] CATH LAB;  Service: Cardiovascular;  Laterality: N/A;  . Abdominal aortagram N/A 05/29/2012    Procedure: ABDOMINAL AORTAGRAM;  Surgeon: Rosetta Posner, MD;  Location: [facility] CATH LAB;  Service: Cardiovascular;  Laterality: N/A;   Social History:  reports that she quit smoking about 16 years ago. Her smoking use included Cigarettes. She has a 20 pack-year smoking history. She has never used smokeless tobacco. She reports that she drinks about 4.2 oz of alcohol per week. She reports that she does not use illicit drugs. Where does patient live home. Can patient participate in ADLs? Yes.  Allergies  Allergen Reactions  . Morphine And Related Nausea And Vomiting    Family History:  Family History  Problem Relation Age of Onset  . Anesthesia problems Neg Hx   . Hypotension Neg Hx   . Malignant hyperthermia Neg Hx   . Pseudochol deficiency Neg Hx   . COPD Mother   . Cancer Mother     BREAST  . Cancer Father     LUNG  . Cancer Brother     Lymphoma  . Heart attack Brother   . Lymphoma Brother   . Heart disease Brother     Heart disease before age 5  . Cancer Brother     colon   . Cancer - Colon Sister   . Cancer Sister     Colon      Prior to Admission medications   Medication Sig Start Date End Date Taking? Authorizing Provider  ALPRAZolam (XANAX) 0.25 MG tablet Take 0.25 mg by mouth 2 (two) times daily as needed for anxiety.  07/29/15  Yes Historical Provider, MD  furosemide (LASIX) 20 MG tablet TAKE 2 TABLETS IN THE MORNING AND 2 IN THE AFTERNOON. Patient taking differently: take 20mg  daily 11/22/15  Yes Juanito Doom, MD  HYDROcodone-acetaminophen (NORCO/VICODIN) 5-325 MG tablet Take 1 tablet by  mouth every 6 (six) hours as needed for moderate pain.   Yes Historical Provider, MD  ibuprofen (ADVIL,MOTRIN) 200 MG tablet Take 200 mg by mouth every 6 (six) hours as needed for moderate pain.   Yes Historical Provider, MD  levothyroxine (SYNTHROID, LEVOTHROID) 75 MCG tablet Take 75 mcg by mouth daily before breakfast.   Yes Historical Provider, MD  mycophenolate (CELLCEPT) 500 MG tablet Take 2 tablets (1,000 mg total) by mouth 2 (two) times daily. 10/29/15  Yes Juanito Doom, MD  potassium chloride (K-DUR) 10 MEQ tablet Take 1 tablet (10 mEq total) by mouth daily. 12/06/15  Yes Juanito Doom, MD  predniSONE (DELTASONE) 10 MG tablet TAKE AS DIRECTED 08/10/15  Yes Juanito Doom, MD  sulfamethoxazole-trimethoprim (BACTRIM DS,SEPTRA DS) 800-160 MG tablet Take 1 tablet by mouth 3 (three) times a week.  08/10/15  Yes Juanito Doom, MD  Tadalafil, PAH, (ADCIRCA) 20 MG TABS Take 1 tablet (20 mg total) by mouth daily. 11/24/15  Yes Juanito Doom, MD  traZODone (DESYREL) 50 MG tablet Take 1 tablet (50 mg total) by mouth at bedtime. DO NOT TAKE THE SAME DAY YOU TAKE AMBIEN 10/29/15  Yes Juanito Doom, MD  zolpidem (AMBIEN) 5 MG tablet Take 5 mg by mouth at bedtime as needed for sleep.   Yes Historical Provider, MD  Elastic Bandages & Supports (MEDICAL COMPRESSION THIGH HIGH) MISC 14mmhg -wear daily 10/29/15   Juanito Doom, MD  potassium chloride (KLOR-CON) 8 MEQ tablet Take 2 tablets (16 mEq total) by mouth 2 (two) times daily. Patient taking differently: Take 8 mEq by mouth daily.  12/01/15   Juanito Doom, MD    Physical Exam: Filed Vitals:   12/09/15 [id number] 12/09/15 0542 12/09/15 0629 12/09/15 0716  BP:  104/80 107/82 104/76  Pulse:  76 79 81  Temp: 97.7 F (36.5 C)     TempSrc: Rectal     Resp:  18 31 19   SpO2:  100% 100% 92%     General:  Moderately built and nourished.  Eyes: Anicteric no pallor.  ENT: No discharge from the ears eyes nose or mouth.  Neck: No  mass felt. JVD mildly elevated.  Cardiovascular: S1-S2 heard.  Respiratory: No rhonchi or crepitations.  Abdomen: Right flank tenderness.  Skin: No rash.  Musculoskeletal: Bilateral lower extremity edema.  Psychiatric: Appears normal.  Neurologic: Alert awake oriented to time place and person. Moves all extremities.  Labs on Admission:  Basic Metabolic Panel:  Recent Labs Lab 12/06/15 1230 12/09/15 0345  NA 132* 134*  K 5.0 5.6*  CL 97 105  CO2 24 19*  GLUCOSE 147* 103*  BUN 19 21*  CREATININE 0.72 0.76  CALCIUM 9.3 8.8*   Liver Function Tests:  Recent Labs Lab 12/06/15 1230  AST 44*  ALT 61*  ALKPHOS 204*  BILITOT 0.9  PROT 6.5  ALBUMIN 3.7   No results for input(s): LIPASE, AMYLASE in the last 168 hours.  Recent Labs Lab 12/09/15 0535  AMMONIA 16   CBC:  Recent Labs Lab 12/09/15 0345  WBC 9.5  HGB 14.3  HCT 43.2  MCV 87.6  PLT 181   Cardiac Enzymes: No results for input(s): CKTOTAL, CKMB, CKMBINDEX, TROPONINI in the last 168 hours.  BNP (last 3 results)  Recent Labs  12/09/15 0345  BNP 2171.8*    ProBNP (last 3 results) No results for input(s): PROBNP in the last 8760 hours.  CBG: No results for input(s): GLUCAP in the last 168 hours.  Radiological Exams on Admission: Dg Chest 2 View  12/09/2015  CLINICAL DATA:  Acute onset of shortness of breath. Initial encounter. EXAM: CHEST  2 VIEW COMPARISON:  Chest radiograph performed 10/22/2015 FINDINGS: The lungs are well-aerated. Mild vascular congestion is noted. Mild bibasilar opacities may reflect mild interstitial edema. Small bilateral pleural effusions are suspected. No pneumothorax is seen. The heart is mildly enlarged. No acute osseous abnormalities are seen. IMPRESSION: Mild vascular congestion and mild cardiomegaly. Mild bibasilar opacities may reflect mild interstitial edema. Suspect small bilateral pleural effusions. Electronically Signed   By: Garald Balding M.D.   On: 12/09/2015  04:13   Ct Head Wo Contrast  12/09/2015  CLINICAL DATA:  Initial evaluation for acute hypoxia. Altered mental status EXAM: CT HEAD WITHOUT CONTRAST TECHNIQUE: Contiguous axial images were obtained from the base of  the skull through the vertex without intravenous contrast. COMPARISON:  None. FINDINGS: Mild diffuse prominence of the CSF containing spaces consistent with generalized cerebral atrophy. Patchy and confluent hypodensity within the periventricular and deep white matter both cerebral hemispheres most consistent with chronic small vessel ischemic disease. Prominent vascular calcifications present within the carotid siphons. 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 There is somewhat more a age indeterminate hypodensity with loss of gray-white matter differentiation within the left parietal lobe (series 2, image 19). Otherwise, gray-white matter differentiation maintained without evidence for acute large vessel territory infarct. Deep gray nuclei preserved. No acute intracranial hemorrhage. No mass lesion, midline shift, or mass effect. No hydrocephalus. Basilar cisterns are patent. No extra-axial fluid collection. Scalp soft tissues demonstrate no acute abnormality. No acute abnormality about the globes and orbits. Sequela prior bilateral lens extraction noted. Paranasal sinuses are clear.  No mastoid effusion. Calvarium intact. IMPRESSION: 1. Age indeterminate hypodensity within the left parietal lobe as above. Further evaluation with MRI is suggested if there is clinical concern for possible acute/subacute ischemia. 2. 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 3. Age-related cerebral atrophy with chronic small vessel ischemic disease. Electronically Signed   By: Jeannine Boga M.D.   On: 12/09/2015 05:25    EKG: Independently reviewed. Normal sinus rhythm low  voltage.  Assessment/Plan Principal Problem:   Acute encephalopathy Active Problems:   Pulmonary hypertension (HCC)   Pulmonary edema   Acute on chronic respiratory failure with hypoxemia (HCC)   Right flank pain   1. Acute encephalopathy - suspect most likely secondary to patient's sedative medications including trazodone and Xanax and recently had pain medications. At this time holding off these medications and will get palliative team for further recommendations. Patient did improve after given Narcan. CT head was showing abnormal densities for which I did discuss with Dr. Janann Colonel on-call neurologist who advised to get MRI of the brain and if it shows troponins and further workup. 2. Acute on chronic respiratory failure secondary to pulmonary edema in a patient with known history of pulmonary hypertension and interstitial lung disease - patient was restarted on Lasix last week after patient was found to have improving sodium. Patient did receive Lasix 40 mg IV in the ER and I have placed patient on Lasix 20 mg IV daily. Closely follow metabolic panels and patient recently had severe hyponatremia. Patient will be continued on other medications for interstitial lung disease including Adcirca, CellCept and prednisone. Patient is on Bactrim 3 times a week. 3. Right flank pain - patient has significant tenderness on the right flank. CT abdomen and pelvis is pending. Rule out any hematoma. 4. Hypothyroidism - on Synthroid. 5. Mild hyperkalemia which I think will improve with Lasix. Closely follow metabolic panel. Will hold all potassium supplements for now.   DVT Prophylaxis SCDs until CT results are available.  Code Status: DO NOT RESUSCITATE. Patient confirmed this with me today along with patient's daughter.  Family Communication: Patient's daughter.  Disposition Plan: Admit to inpatient.    Kyasia Steuck N. [facility] Pager [phone].  If 7PM-7AM, please contact  night-coverage [email or web address] Password [email or web address] 12/09/2015, 7:32 AM

## 2015-12-09 NOTE — ED Notes (Signed)
Bed: [id number] Expected date:  Expected time:  Means of arrival:  Comments: 55F abd pain/bedridden

## 2015-12-09 NOTE — ED Notes (Signed)
Patient transported to CT 

## 2015-12-09 NOTE — Progress Notes (Signed)
Pre MD, patient does not need telemetry or continuous pulse ox ordered.

## 2015-12-09 NOTE — ED Notes (Signed)
Pt transported from home by EMS for c/o abd pain/rib pain/hypoxia. Pt c/o pain all over on exam by EMS. Initial 02 83% on 7L [place] at home. Up to 98% on NRB. Will open eyes to verbal stimuli.

## 2015-12-09 NOTE — ED Notes (Signed)
Pt can go to floor at 08:05

## 2015-12-09 NOTE — Progress Notes (Signed)
Inpatient [place] [id number] [facility] RN Visit Gip related admission to [facility] Dx of Interstitial Lung disease. Pt. Is a DNR code status. Patient seen in room sleeping with O on at 6 L [place]. Resps regular and unlabored. Patient admitted early this morning with c/o of right flank pain and shortness of breath. Pt's daughter is at her bedside. Daughter Wells Guiles reports pt. Has a good appetite and can get out of bed  to the BR with assist. She reports the last few evenings pt. Has been c/o pain " all over." [facility] will continue to follow daily. Please call [phone] with any questions.  Medication list and transfer summary placed on shadow chart.  [facility] Liaison [phone]

## 2015-12-09 NOTE — Progress Notes (Signed)
MRI notified this RN of patient's O2 Sats decreasing to 82% and patient requiring 8L/[place] to maintain O2 Sats at 89%. Stated patient was not c/o SOB and in no signs of distress. MD notified and stated to maintain O2 Sats between 88-90% is acceptable. MRI notified of MD's response.

## 2015-12-09 NOTE — Consult Note (Signed)
Consultation Note Date: 12/09/2015   Patient Name: [name] [name]  DOB: [birth year]  MRN: [id number]  Age / Sex: 70 y.o., female  PCP: Prince Solian, MD Referring Physician: Geradine Girt, DO  Reason for Consultation: Establishing goals of care    Clinical Assessment/Narrative: Ms Fehlman is a 70 yo lady with past medical history significant for pulmonary hypertension, interstitial lung disease, PAD. She follows with Dr. Lake Bells. She has recently been assigned Hospice services.   The patient lives by herself, but her step daughter Wells Guiles who is her HCPOA agent has moved in with her for the past 2 weeks. The patient has had ongoing decline- she has complained of pain all over, dyspnea off and on for the past 2 weeks. She has had Hospice nurses come out and evaluate her, she has received Xanax and PRN Norco. The patient complained of acute pain in R flank, was more confused than usual and was brought in to the ED.   The patient uses 7 L O2 at home during the day. The patient has been enrolled with [facility] since end of  December 2016. CT abdomen in the ED showed no hematoma.   Palliative care consulted for further input.   The patient is awake alert, resting in bed, she states her pain in R flank is not as sharp anymore. She denies dyspnea or nausea.   Call placed and discussed with Naida Sleight: she wishes for hospice care to continue after d/c. Discussed about decline trajectory pertaining to lung diseases. Discussed about adequate symptom management through hospice so as to avoid hospitalizations.   Continue to monitor use of Xanax and norco PRN. Resume [facility] services after d/c.   Contacts/Participants in Discussion: Primary Decision Maker:     Relationship to Patient  Naida Sleight step daughter HCPOA agent: [phone]  HCPOA: yes     SUMMARY OF RECOMMENDATIONS: DNR DNI Continue with hospice support Follow up  MRI brain Monitor PRN medication use: Xanax and Norco.  Goals are for palliative/ comfort based approach to care to continue.    Code Status/Advance Care Planning: DNR    Code Status Orders        Start     Ordered   12/09/15 0825  Do not attempt resuscitation (DNR)   Continuous    Question Answer Comment  In the event of cardiac or respiratory ARREST Do not call a "code blue"   In the event of cardiac or respiratory ARREST Do not perform Intubation, CPR, defibrillation or ACLS   In the event of cardiac or respiratory ARREST Use medication by any route, position, wound care, and other measures to relive pain and suffering. May use oxygen, suction and manual treatment of airway obstruction as needed for comfort.      12/09/15 0824    Code Status History    Date Active Date Inactive Code Status Order ID Comments User Context   10/19/2015  7:16 PM 10/23/2015  3:21 PM Full Code [id number]  Geradine Girt, DO Inpatient   08/11/2013  7:43 PM 08/14/2013  3:17 PM Full Code [id number]  Ellwood Handler, PA-C Inpatient   06/17/2012  3:58 PM 06/19/2012 12:15 PM Full Code [id number]  Karen Kays, RN Inpatient   01/17/2012  6:55 PM 01/19/2012  3:13 PM Full Code [id number]  Liana Gerold, RN Inpatient   09/11/2011  6:33 PM 09/14/2011  1:41 PM Full Code [id number]  [place], RN Inpatient    Advance Directive Documentation  Most Recent Value   Type of Advance Directive  Healthcare Power of Attorney, Living will   Pre-existing out of facility DNR order (yellow form or pink MOST form)     "MOST" Form in Place?        Other Directives:Other  Symptom Management:    as above   Palliative Prophylaxis:   Delirium Protocol  Additional Recommendations (Limitations, Scope, Preferences):  D/c back home with hospice   Psycho-social/Spiritual:  Support System: [place] Desire for further Chaplaincy support:no Additional Recommendations: Education on Hospice  Prognosis:  Weeks to  months  Discharge Planning: Home with Hospice   Chief Complaint/ Primary Diagnoses: Present on Admission:  . Acute encephalopathy . Acute on chronic respiratory failure with hypoxemia ([place]) . Right flank pain . Pulmonary hypertension ([place]) . Pulmonary edema  I have reviewed the medical record, interviewed the patient and family, and examined the patient. The following aspects are pertinent.  Past Medical History  Diagnosis Date  . Peripheral arterial disease ([place])   . Hyperlipidemia   . Thyroid disease     Hypothyroidism  . Hypothyroidism   . Blood transfusion     2 /12 yrs ago  . Arthritis   . Arrhythmia     when hyperthyroid  . Dyspnea on exertion 01/22/2013  . GERD (gastroesophageal reflux disease)     otc  . Carotid artery occlusion    Social History   Social History  . Marital Status: Widowed    Spouse Name: N/A  . Number of Children: 2  . Years of Education: N/A   Occupational History  . retired   . wokring part time-- [employer] History Main Topics  . Smoking status: Former Smoker -- 1.00 packs/day for 20 years    Types: Cigarettes    Quit date: 09/07/1999  . Smokeless tobacco: Never Used  . Alcohol Use: 4.2 oz/week    7 Glasses of wine per week     Comment: wine-- 5 glasses a week  . Drug Use: No  . Sexual Activity: Not Asked   Other Topics Concern  . None   Social History Narrative   Family History  Problem Relation Age of Onset  . Anesthesia problems Neg Hx   . Hypotension Neg Hx   . Malignant hyperthermia Neg Hx   . Pseudochol deficiency Neg Hx   . COPD Mother   . Cancer Mother     BREAST  . Cancer Father     LUNG  . Cancer Brother     Lymphoma  . Heart attack Brother   . Lymphoma Brother   . Heart disease Brother     Heart disease before age 54  . Cancer Brother     colon   . Cancer - Colon Sister   . Cancer Sister     Colon   Scheduled Meds: . feeding supplement (ENSURE ENLIVE)  237 mL Oral BID BM  .  furosemide  20 mg Intravenous Daily  . levothyroxine  75 mcg Oral QAC breakfast  . lidocaine  1 patch Transdermal Q24H  . mycophenolate  1,000 mg Oral BID  . predniSONE  10 mg Oral Q breakfast  . predniSONE  5 mg Oral QPC lunch  . senna-docusate  1 tablet Oral BID  . [START ON 12/10/2015] sulfamethoxazole-trimethoprim  1 tablet Oral Once per day on Mon Wed Fri  . Tadalafil (PAH)  20 mg Oral Daily  . traZODone  50 mg Oral QHS  Continuous Infusions:  PRN Meds:.traMADol Medications Prior to Admission:  Prior to Admission medications   Medication Sig Start Date End Date Taking? Authorizing Provider  ALPRAZolam (XANAX) 0.25 MG tablet Take 0.25 mg by mouth 2 (two) times daily as needed for anxiety.  07/29/15  Yes Historical Provider, MD  furosemide (LASIX) 20 MG tablet TAKE 2 TABLETS IN THE MORNING AND 2 IN THE AFTERNOON. Patient taking differently: take 20mg  daily 11/22/15  Yes Juanito Doom, MD  HYDROcodone-acetaminophen (NORCO/VICODIN) 5-325 MG tablet Take 1 tablet by mouth every 6 (six) hours as needed for moderate pain.   Yes Historical Provider, MD  ibuprofen (ADVIL,MOTRIN) 200 MG tablet Take 200 mg by mouth every 6 (six) hours as needed for moderate pain.   Yes Historical Provider, MD  levothyroxine (SYNTHROID, LEVOTHROID) 75 MCG tablet Take 75 mcg by mouth daily before breakfast.   Yes Historical Provider, MD  mycophenolate (CELLCEPT) 500 MG tablet Take 2 tablets (1,000 mg total) by mouth 2 (two) times daily. 10/29/15  Yes Juanito Doom, MD  potassium chloride (K-DUR) 10 MEQ tablet Take 1 tablet (10 mEq total) by mouth daily. 12/06/15  Yes Juanito Doom, MD  predniSONE (DELTASONE) 10 MG tablet TAKE AS DIRECTED 08/10/15  Yes Juanito Doom, MD  sulfamethoxazole-trimethoprim (BACTRIM DS,SEPTRA DS) 800-160 MG tablet Take 1 tablet by mouth 3 (three) times a week. 08/10/15  Yes Juanito Doom, MD  Tadalafil, PAH, (ADCIRCA) 20 MG TABS Take 1 tablet (20 mg total) by mouth daily.  11/24/15  Yes Juanito Doom, MD  traZODone (DESYREL) 50 MG tablet Take 1 tablet (50 mg total) by mouth at bedtime. DO NOT TAKE THE SAME DAY YOU TAKE AMBIEN 10/29/15  Yes Juanito Doom, MD  zolpidem (AMBIEN) 5 MG tablet Take 5 mg by mouth at bedtime as needed for sleep.   Yes Historical Provider, MD  Elastic Bandages & Supports (MEDICAL COMPRESSION THIGH HIGH) MISC 73mmhg -wear daily 10/29/15   Juanito Doom, MD  potassium chloride (KLOR-CON) 8 MEQ tablet Take 2 tablets (16 mEq total) by mouth 2 (two) times daily. Patient taking differently: Take 8 mEq by mouth daily.  12/01/15   Juanito Doom, MD   Allergies  Allergen Reactions  . Morphine And Related Nausea And Vomiting    Review of Systems Pain in R flank, episodic dyspnea.   Physical Exam Elderly appearing lady, in no distress currently S1 S2 Diminished Abdomen soft, mild tenderness R quadrant No edema Non focal  Vital Signs: BP 106/55 mmHg  Pulse 86  Temp(Src) 97.6 F (36.4 C) (Oral)  Resp 18  Ht 5' (1.524 m)  Wt 45 kg (99 lb 3.3 oz)  BMI 19.38 kg/m2  SpO2 92%  SpO2: SpO2: 92 % O2 Device:SpO2: 92 % O2 Flow Rate: .O2 Flow Rate (L/min): 6 L/min  IO: Intake/output summary:  Intake/Output Summary (Last 24 hours) at 12/09/15 1436 Last data filed at 12/09/15 1212  Gross per 24 hour  Intake      0 ml  Output   1550 ml  Net  -1550 ml    LBM:   Baseline Weight: Weight: 45 kg (99 lb 3.3 oz) Most recent weight: Weight: 45 kg (99 lb 3.3 oz)      Palliative Assessment/Data:  Flowsheet Rows        Most Recent Value   Intake Tab    Referral Department  Hospitalist   Unit at Time of Referral  Med/Surg Unit   Palliative Care Primary  Diagnosis  Pulmonary   Date Notified  12/09/15   Palliative Care Type  New Palliative care   Reason for referral  Pain, Non-pain Symptom   Date of Admission  12/09/15   # of days IP prior to Palliative referral  0   Clinical Assessment    Palliative Performance Scale Score   40%   Pain Max last 24 hours  7   Pain Min Last 24 hours  6   Dyspnea Max Last 24 Hours  7   Dyspnea Min Last 24 hours  6   Psychosocial & Spiritual Assessment    Palliative Care Outcomes    Patient/Family meeting held?  Yes   Who was at the meeting?  patient, daughter over the phone   Palliative Care Outcomes  Clarified goals of care, Counseled regarding hospice   Palliative Care follow-up planned  Yes, Facility      Additional Data Reviewed:  CBC:    Component Value Date/Time   WBC 9.3 12/09/2015 0927   WBC 13.6* 11/18/2014 1027   HGB 14.6 12/09/2015 0927   HCT 45.1 12/09/2015 0927   PLT 162 12/09/2015 0927   MCV 88.8 12/09/2015 0927   NEUTROABS 12.2* 10/19/2015 1614   NEUTROABS 10.8* 11/18/2014 1027   LYMPHSABS 0.4* 10/19/2015 1614   LYMPHSABS 1.6 11/18/2014 1027   MONOABS 0.7 10/19/2015 1614   EOSABS 0.0 10/19/2015 1614   EOSABS 0.2 11/18/2014 1027   BASOSABS 0.0 10/19/2015 1614   BASOSABS 0.1 11/18/2014 1027   Comprehensive Metabolic Panel:    Component Value Date/Time   NA 138 12/09/2015 0927   K 4.4 12/09/2015 0927   CL 102 12/09/2015 0927   CO2 25 12/09/2015 0927   BUN 21* 12/09/2015 0927   CREATININE 0.81 12/09/2015 0927   GLUCOSE 81 12/09/2015 0927   CALCIUM 8.8* 12/09/2015 0927   AST 37 12/09/2015 0927   ALT 68* 12/09/2015 0927   ALKPHOS 212* 12/09/2015 0927   BILITOT 0.9 12/09/2015 0927   PROT 6.4* 12/09/2015 0927   ALBUMIN 3.7 12/09/2015 0927     Time In: 10 Time Out: 11 Time Total: 60 min  Greater than 50%  of this time was spent counseling and coordinating care related to the above assessment and plan.  Signed by: Loistine Chance, MD [place], MD  12/09/2015, 2:36 PM  Please contact Palliative Medicine Team phone at [phone] for questions and concerns.

## 2015-12-09 NOTE — ED Notes (Signed)
Pt to radiology via stretcher.  

## 2015-12-09 NOTE — Care Management Note (Signed)
Case Management Note  Patient Details  Name: [name] [name] MRN: [id number] Date of Birth: 1946-09-19  Subjective/Objective:        70 yo admitted with Acute Encephalopathy            Action/Plan: From home with Stepdaughter and [facility]. Plan to DC home with [facility].  Expected Discharge Date:                  Expected Discharge Plan:  Home w Hospice Care  In-House Referral:     Discharge planning Services  CM Consult  Post Acute Care Choice:    Choice offered to:     DME Arranged:    DME Agency:     HH Arranged:    [place] Agency:  [facility]  Status of Service:  In process, will continue to follow  Medicare Important Message Given:    Date Medicare IM Given:    Medicare IM give by:    Date Additional Medicare IM Given:    Additional Medicare Important Message give by:     If discussed at [place] of Stay Meetings, dates discussed:    Additional CommentsLynnell Catalan, RN 12/09/2015, 3:03 PM [phone]

## 2015-12-09 NOTE — Telephone Encounter (Signed)
Will forward to Dr. Lake Bells as an Juluis Rainier

## 2015-12-09 NOTE — Progress Notes (Signed)
RN spoke with the charge nurse in ED and she verified with the RN  that the patient did pass the  RN stroke swallow screen this am.

## 2015-12-09 NOTE — ED Provider Notes (Signed)
CSN: [id number]     Arrival date & time 12/09/15  [id number] History   By signing my name below, I, [name] [name], attest that this documentation has been prepared under the direction and in the presence of [name] Russaw, MD.  Electronically Signed: Forrestine [name], ED Scribe. 12/09/2015. 4:08 AM.   Chief Complaint  Patient presents with  . Hypoxia    The history is provided by the EMS personnel. The history is limited by the condition of the patient.    LEVEL 5 CAVEAT DUE TO CONDITION   HPI Comments: [name] [name] brought in by EMS is a 70 y.o. female with a PMHx of hyperlipidemia, thyroid disease, and GERD who presents to the Emergency Department her for hypoxia this evening. Per triage note, pt initially c/o constant, ongoing abdominal pain and rib pain. Initially oxygen saturation was noted at 83% on 7 liters [place]. No interventions given en route to department. However, pt was placed on NRB with increase in Oxygen to 98%.  PCP: Tivis Ringer, MD    Past Medical History  Diagnosis Date  . Peripheral arterial disease ([place])   . Hyperlipidemia   . Thyroid disease     Hypothyroidism  . Hypothyroidism   . Blood transfusion     2 /12 yrs ago  . Arthritis   . Arrhythmia     when hyperthyroid  . Dyspnea on exertion 01/22/2013  . GERD (gastroesophageal reflux disease)     otc  . Carotid artery occlusion    Past Surgical History  Procedure Laterality Date  . Pr vein bypass graft,aorto-fem-pop  03/02/09    Aortobifemoral BPG, Bilateral fem- popliteal BPG  . Hand surgery      cyst removed left hand  . Appendectomy      45 yrs ago  . Femoral-popliteal bypass graft  09/11/2011    Procedure: BYPASS GRAFT FEMORAL-POPLITEAL ARTERY;  Surgeon: Rosetta Posner, MD;  Location: [facility];  Service: Vascular;  Laterality: Right;  Thrombectomy right femoral popliteal bypass with intraoperative arteriogram  . Embolectomy  09/12/2011    Procedure: EMBOLECTOMY;  Surgeon: Theotis Burrow, MD;  Location: [facility] OR;   Service: Vascular;  Laterality: Right;  Embolectomy Right Femoral -Popliteal Bypass Graft, Revision of Distal Anastomosis  . Femoral-popliteal bypass graft  09/11/2011    Procedure: BYPASS GRAFT FEMORAL-POPLITEAL ARTERY;  Surgeon: Rosetta Posner, MD;  Location: [place];  Service: Vascular;  Laterality: Right;  Thrombectomy right femoral popliteal bypass with intraoperative arteriogram  . Breast surgery      duct removed left breast  . Femoral-popliteal bypass graft  01/17/2012    Procedure: BYPASS GRAFT FEMORAL-POPLITEAL ARTERY;  Surgeon: Rosetta Posner, MD;  Location: [facility] OR;  Service: Vascular;  Laterality: Right;  right femoral to below knee popliteal bypass graft with vein  . Intraoperative arteriogram  01/17/2012    Procedure: INTRA OPERATIVE ARTERIOGRAM;  Surgeon: Rosetta Posner, MD;  Location: [facility] OR;  Service: Vascular;  Laterality: Right;  . Abdominal hysterectomy    . Femoral-popliteal bypass graft  06/17/2012    Procedure: BYPASS GRAFT FEMORAL-POPLITEAL ARTERY;  Surgeon: Rosetta Posner, MD;  Location: [facility] OR;  Service: Vascular;  Laterality: Right;  revision  . Cardiovascular stress test  May 2014  . Pulmonary stress test  July 2014  . Video assisted thoracoscopy Left 08/11/2013    Procedure: VIDEO ASSISTED THORACOSCOPY;  Surgeon: Melrose Nakayama, MD;  Location: [place];  Service: Thoracic;  Laterality: Left;  LEFT  VATS, LUNG  BX  . Lung biopsy  Oct. 2014  . Cataract extraction    . Eye surgery    . Abdominal aortagram N/A 09/08/2011    Procedure: ABDOMINAL Maxcine Ham;  Surgeon: Elam Dutch, MD;  Location: [facility] CATH LAB;  Service: Cardiovascular;  Laterality: N/A;  . Lower extremity angiogram N/A 09/08/2011    Procedure: LOWER EXTREMITY ANGIOGRAM;  Surgeon: Elam Dutch, MD;  Location: [facility] CATH LAB;  Service: Cardiovascular;  Laterality: N/A;  . Abdominal aortagram N/A 05/29/2012    Procedure: ABDOMINAL AORTAGRAM;  Surgeon: Rosetta Posner, MD;  Location: [facility] CATH LAB;  Service: Cardiovascular;   Laterality: N/A;   Family History  Problem Relation Age of Onset  . Anesthesia problems Neg Hx   . Hypotension Neg Hx   . Malignant hyperthermia Neg Hx   . Pseudochol deficiency Neg Hx   . COPD Mother   . Cancer Mother     BREAST  . Cancer Father     LUNG  . Cancer Brother     Lymphoma  . Heart attack Brother   . Lymphoma Brother   . Heart disease Brother     Heart disease before age 10  . Cancer Brother     colon   . Cancer - Colon Sister   . Cancer Sister     Colon   Social History  Substance Use Topics  . Smoking status: Former Smoker -- 1.00 packs/day for 20 years    Types: Cigarettes    Quit date: 09/07/1999  . Smokeless tobacco: Never Used  . Alcohol Use: 4.2 oz/week    7 Glasses of wine per week     Comment: wine-- 5 glasses a week   OB History    No data available     Review of Systems  Unable to perform ROS: Other      Allergies  Morphine and related  Home Medications   Prior to Admission medications   Medication Sig Start Date End Date Taking? Authorizing Provider  ALPRAZolam (XANAX) 0.25 MG tablet Take 0.25 mg by mouth 2 (two) times daily as needed. for anxiety prn only take twice a month 07/29/15   Historical Provider, MD  aspirin EC 81 MG tablet Take 81 mg by mouth at bedtime.    Historical Provider, MD  Elastic Bandages & Supports (MEDICAL COMPRESSION THIGH HIGH) MISC 41mmhg -wear daily 10/29/15   Juanito Doom, MD  furosemide (LASIX) 20 MG tablet TAKE 2 TABLETS IN THE MORNING AND 2 IN THE AFTERNOON. Patient taking differently: take 20mg  daily 11/22/15   Juanito Doom, MD  levothyroxine (SYNTHROID, LEVOTHROID) 75 MCG tablet Take 75 mcg by mouth daily before breakfast.    Historical Provider, MD  mycophenolate (CELLCEPT) 500 MG tablet Take 2 tablets (1,000 mg total) by mouth 2 (two) times daily. 10/29/15   Juanito Doom, MD  potassium chloride (K-DUR) 10 MEQ tablet Take 1 tablet (10 mEq total) by mouth daily. 12/06/15   Juanito Doom, MD  potassium chloride (KLOR-CON) 8 MEQ tablet Take 2 tablets (16 mEq total) by mouth 2 (two) times daily. Patient taking differently: Take 8 mEq by mouth daily.  12/01/15   Juanito Doom, MD  predniSONE (DELTASONE) 10 MG tablet TAKE AS DIRECTED 08/10/15   Juanito Doom, MD  sulfamethoxazole-trimethoprim (BACTRIM DS,SEPTRA DS) 800-160 MG tablet Take 1 tablet by mouth 3 (three) times a week. 08/10/15   Juanito Doom, MD  Tadalafil, PAH, (ADCIRCA) 20 MG TABS Take  1 tablet (20 mg total) by mouth daily. Patient not taking: Reported on 11/29/2015 11/24/15   Juanito Doom, MD  traZODone (DESYREL) 50 MG tablet Take 1 tablet (50 mg total) by mouth at bedtime. DO NOT TAKE THE SAME DAY YOU TAKE AMBIEN 10/29/15   Juanito Doom, MD  zolpidem (AMBIEN) 5 MG tablet Take 5 mg by mouth at bedtime as needed for sleep.    Historical Provider, MD   Triage Vitals: BP 162/99 mmHg  Pulse 94  Temp(Src) 97.9 F (36.6 C) (Oral)  Resp 18  SpO2 98%   Physical Exam  Constitutional: She is oriented to person, place, and time. She appears well-developed and well-nourished. No distress.  HENT:  Head: Normocephalic and atraumatic.  Mouth/Throat: Oropharynx is clear and moist. No oropharyngeal exudate.  Eyes: EOM are normal. Pupils are equal, round, and reactive to light.  Neck: Normal range of motion.  No carotid bruits  Trachea midline  Cardiovascular: Normal rate, regular rhythm, normal heart sounds and intact distal pulses.   Pulses:      Dorsalis pedis pulses are 2+ on the right side, and 2+ on the left side.  Pulmonary/Chest: Effort normal and breath sounds normal. No stridor. She has no wheezes. She has no rales.  Abdominal: Soft. She exhibits no distension and no mass. There is no tenderness. There is no rebound and no guarding.  Gassy   Musculoskeletal: Normal range of motion.  Lymphadenopathy:    She has no cervical adenopathy.  Neurological: She is alert and oriented to person,  place, and time.  Skin: Skin is warm and dry.  Psychiatric: She has a normal mood and affect. Judgment normal.  Nursing note and vitals reviewed.   ED Course  Procedures (including critical care time)  DIAGNOSTIC STUDIES: Oxygen Saturation is 98% on RA, adequate by my interpretation.    COORDINATION OF CARE: 3:53 AM- Will order blood work. Discussed treatment plan with pt at bedside and pt agreed to plan.     Labs Review Labs Reviewed  BASIC METABOLIC PANEL  CBC  BRAIN NATRIURETIC PEPTIDE  I-STAT [place], ED    Imaging Review No results found. I have personally reviewed and evaluated these images and lab results as part of my medical decision-making.   EKG Interpretation None      MDM   Final diagnoses:  None     EKG Interpretation  Date/Time:  Thursday December 09 2015 03:51:21 EST Ventricular Rate:  92 PR Interval:  169 QRS Duration: 86 QT Interval:  338 QTC Calculation: 418 R Axis:   127 Text Interpretation:  Sinus rhythm Left atrial enlargement Low voltage, precordial leads Borderline repolarization abnormality Confirmed by [facility]  MD, Yenty Bloch ([id number]) on 12/09/2015 4:09:25 AM      Results for orders placed or performed during the hospital encounter of [id number]  Basic metabolic panel  Result Value Ref Range   Sodium 134 (L) 135 - 145 mmol/L   Potassium 5.6 (H) 3.5 - 5.1 mmol/L   Chloride 105 101 - 111 mmol/L   CO2 19 (L) 22 - 32 mmol/L   Glucose, Bld 103 (H) 65 - 99 mg/dL   BUN 21 (H) 6 - 20 mg/dL   Creatinine, Ser 0.76 0.44 - 1.00 mg/dL   Calcium 8.8 (L) 8.9 - 10.3 mg/dL   GFR calc non Af Amer >60 >60 mL/min   GFR calc Af Amer >60 >60 mL/min   Anion gap 10 5 - 15  CBC  Result Value Ref  Range   WBC 9.5 4.0 - 10.5 K/uL   RBC 4.93 3.87 - 5.11 MIL/uL   Hemoglobin 14.3 12.0 - 15.0 g/dL   HCT 43.2 36.0 - 46.0 %   MCV 87.6 78.0 - 100.0 fL   MCH 29.0 26.0 - 34.0 pg   MCHC 33.1 30.0 - 36.0 g/dL   RDW 16.9 (H) 11.5 - 15.5 %   Platelets 181 150  - 400 K/uL  Brain natriuretic peptide  Result Value Ref Range   B Natriuretic Peptide 2171.8 (H) 0.0 - 100.0 pg/mL  Blood gas, arterial ([facility] & [facility] ONLY)  Result Value Ref Range   O2 Content 8.0 L/min   Delivery systems NASAL CANNULA    pH, Arterial 7.453 (H) 7.350 - 7.450   pCO2 arterial 27.8 (L) 35.0 - 45.0 mmHg   pO2, Arterial 87.2 80.0 - 100.0 mmHg   Bicarbonate 19.2 (L) 20.0 - 24.0 mEq/L   TCO2 16.8 0 - 100 mmol/L   Acid-base deficit 3.1 (H) 0.0 - 2.0 mmol/L   O2 Saturation 96.0 %   Patient temperature 37.0    Collection site RIGHT BRACHIAL    Drawn by [id number]    Sample type ARTERIAL DRAW   I-stat troponin, ED (not at [facility], [facility])  Result Value Ref Range   Troponin i, poc 0.04 0.00 - 0.08 ng/mL   Comment 3           Dg Chest 2 View  12/09/2015  CLINICAL DATA:  Acute onset of shortness of breath. Initial encounter. EXAM: CHEST  2 VIEW COMPARISON:  Chest radiograph performed 10/22/2015 FINDINGS: The lungs are well-aerated. Mild vascular congestion is noted. Mild bibasilar opacities may reflect mild interstitial edema. Small bilateral pleural effusions are suspected. No pneumothorax is seen. The heart is mildly enlarged. No acute osseous abnormalities are seen. IMPRESSION: Mild vascular congestion and mild cardiomegaly. Mild bibasilar opacities may reflect mild interstitial edema. Suspect small bilateral pleural effusions. Electronically Signed   By: Garald Balding M.D.   On: 12/09/2015 04:13    Medications  furosemide (LASIX) injection 40 mg (not administered)  naloxone (NARCAN) injection 0.4 mg (not administered)   Admit to step down per Dr. Hal Hope   I personally performed the services described in this documentation, which was scribed in my presence. The recorded information has been reviewed and is accurate.     Veatrice Kells, MD 12/09/15 [phone]

## 2015-12-09 NOTE — ED Notes (Signed)
Pt more awake and talkative at this time, family also states pt is more alert. VSS. Rates pain "steady"

## 2015-12-09 NOTE — Progress Notes (Signed)
PROGRESS NOTE  [name] [name] [id number] DOB: [birth year] DOA: 12/09/2015 PCP: Tivis Ringer, MD  Assessment/Plan: Acute encephalopathy - suspect most likely secondary to patient's sedative medications including trazodone and Xanax and recently had pain medications.  Patient did improve after given Narcan.  Hospice at home CT head was showing abnormal densities for which Dr. Raliegh Ip discussed with Dr. Janann Colonel on-call neurologist who advised to get MRI of the brain  Acute on chronic respiratory failure secondary to pulmonary edema in a patient with known history of pulmonary hypertension and interstitial lung disease -   Lasix 20 mg IV daily.  -Closely follow metabolic panels and patient recently had severe hyponatremia. -continue on other medications for interstitial lung disease including Adcirca, CellCept and prednisone. Patient is on Bactrim 3 times a week.  RUQ pain - patient has significant tenderness on the right flank. -not over ribs -add lidocaine   Hypothyroidism - on Synthroid.  Code Status: DNR Family Communication: patient/step daughter at bedside Disposition Plan: on home hospice   Consultants:  Hospice/pallaitive care  Procedures:      HPI/Subjective: Pain in RUQ  Objective: Filed Vitals:   12/09/15 0817 12/09/15 1300  BP: 124/65 106/55  Pulse:  90  Temp: 97.6 F (36.4 C) 98 F (36.7 C)  Resp: 20 18    Intake/Output Summary (Last 24 hours) at 12/09/15 1353 Last data filed at 12/09/15 1212  Gross per 24 hour  Intake      0 ml  Output   1550 ml  Net  -1550 ml   Filed Weights   12/09/15 0817  Weight: 45 kg (99 lb 3.3 oz)    Exam:   General:  awake  Cardiovascular: rrr  Respiratory: clear  Abdomen: +BS, soft, tender on right upper quadrant   Data Reviewed: Basic Metabolic Panel:  Recent Labs Lab 12/06/15 1230 12/09/15 0345 12/09/15 0927  NA 132* 134* 138  K 5.0 5.6* 4.4  CL 97 105 102  CO2 24 19* 25  GLUCOSE 147* 103* 81    BUN 19 21* 21*  CREATININE 0.72 0.76 0.81  CALCIUM 9.3 8.8* 8.8*   Liver Function Tests:  Recent Labs Lab 12/06/15 1230 12/09/15 0927  AST 44* 37  ALT 61* 68*  ALKPHOS 204* 212*  BILITOT 0.9 0.9  PROT 6.5 6.4*  ALBUMIN 3.7 3.7   No results for input(s): LIPASE, AMYLASE in the last 168 hours.  Recent Labs Lab 12/09/15 0535  AMMONIA 16   CBC:  Recent Labs Lab 12/09/15 0345 12/09/15 0927  WBC 9.5 9.3  HGB 14.3 14.6  HCT 43.2 45.1  MCV 87.6 88.8  PLT 181 162   Cardiac Enzymes: No results for input(s): CKTOTAL, CKMB, CKMBINDEX, TROPONINI in the last 168 hours. BNP (last 3 results)  Recent Labs  12/09/15 0345  BNP 2171.8*    ProBNP (last 3 results) No results for input(s): PROBNP in the last 8760 hours.  CBG: No results for input(s): GLUCAP in the last 168 hours.  No results found for this or any previous visit (from the past 240 hour(s)).   Studies: Ct Abdomen Pelvis Wo Contrast  12/09/2015  CLINICAL DATA:  Right flank pain for 1 week. No nausea or vomiting. No diarrhea. EXAM: CT ABDOMEN AND PELVIS WITHOUT CONTRAST TECHNIQUE: Multidetector CT imaging of the abdomen and pelvis was performed following the standard protocol without IV contrast. COMPARISON:  None. FINDINGS: Lower chest: Bilateral lower lung chronic interstitial lung disease with interstitial fibrosis. Trace pericardial effusion. Cardiomegaly. Coronary artery  atherosclerosis in the circumflex. Hepatobiliary: Normal liver.  Normal gallbladder. Pancreas: Normal. Spleen: Normal. Adrenals/Urinary Tract: Normal adrenal glands. Normal kidneys. No urolithiasis or obstructive uropathy. Normal bladder. Stomach/Bowel: No bowel wall thickening or dilatation. No pneumatosis, pneumoperitoneum or portal venous gas. Moderate amount of stool throughout the colon. Prior appendectomy. No abdominal or pelvic free fluid. Vascular/Lymphatic: Normal caliber abdominal aorta. Aorto-bifemoral bypass graft. Reproductive: Prior  hysterectomy. Other: No fluid collection or hematoma. Musculoskeletal: No acute osseous abnormality. No lytic or sclerotic osseous lesion. Bilateral facet arthropathy at L4-5 and L5-S1. IMPRESSION: 1. No acute abdominal or pelvic pathology. 2. Bilateral lower lobe chronic interstitial lung disease with interstitial fibrosis. 3. Trace pericardial effusion. Electronically Signed   By: Kathreen Devoid   On: 12/09/2015 07:56   Dg Chest 2 View  12/09/2015  CLINICAL DATA:  Acute onset of shortness of breath. Initial encounter. EXAM: CHEST  2 VIEW COMPARISON:  Chest radiograph performed 10/22/2015 FINDINGS: The lungs are well-aerated. Mild vascular congestion is noted. Mild bibasilar opacities may reflect mild interstitial edema. Small bilateral pleural effusions are suspected. No pneumothorax is seen. The heart is mildly enlarged. No acute osseous abnormalities are seen. IMPRESSION: Mild vascular congestion and mild cardiomegaly. Mild bibasilar opacities may reflect mild interstitial edema. Suspect small bilateral pleural effusions. Electronically Signed   By: Garald Balding M.D.   On: 12/09/2015 04:13   Ct Head Wo Contrast  12/09/2015  CLINICAL DATA:  Initial evaluation for acute hypoxia. Altered mental status EXAM: CT HEAD WITHOUT CONTRAST TECHNIQUE: Contiguous axial images were obtained from the base of the skull through the vertex without intravenous contrast. COMPARISON:  None. FINDINGS: Mild diffuse prominence of the CSF containing spaces consistent with generalized cerebral atrophy. Patchy and confluent hypodensity within the periventricular and deep white matter both cerebral hemispheres most consistent with chronic small vessel ischemic disease. Prominent vascular calcifications present within the carotid siphons. 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 There is somewhat more a age indeterminate hypodensity with loss of gray-white matter differentiation within  the left parietal lobe (series 2, image 19). Otherwise, gray-white matter differentiation maintained without evidence for acute large vessel territory infarct. Deep gray nuclei preserved. No acute intracranial hemorrhage. No mass lesion, midline shift, or mass effect. No hydrocephalus. Basilar cisterns are patent. No extra-axial fluid collection. Scalp soft tissues demonstrate no acute abnormality. No acute abnormality about the globes and orbits. Sequela prior bilateral lens extraction noted. Paranasal sinuses are clear.  No mastoid effusion. Calvarium intact. IMPRESSION: 1. Age indeterminate hypodensity within the left parietal lobe as above. Further evaluation with MRI is suggested if there is clinical concern for possible acute/subacute ischemia. 2. 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 3. Age-related cerebral atrophy with chronic small vessel ischemic disease. Electronically Signed   By: Jeannine Boga M.D.   On: 12/09/2015 05:25    Scheduled Meds: . feeding supplement (ENSURE ENLIVE)  237 mL Oral BID BM  . furosemide  20 mg Intravenous Daily  . levothyroxine  75 mcg Oral QAC breakfast  . lidocaine  1 patch Transdermal Q24H  . mycophenolate  1,000 mg Oral BID  . predniSONE  10 mg Oral Q breakfast  . predniSONE  5 mg Oral QPC lunch  . senna-docusate  1 tablet Oral BID  . [START ON 12/10/2015] sulfamethoxazole-trimethoprim  1 tablet Oral Once per day on Mon Wed Fri  . Tadalafil (PAH)  20 mg Oral Daily  . traZODone  50 mg Oral QHS   Continuous Infusions:  Antibiotics Given (last 72 hours)    None      Principal Problem:   Acute encephalopathy Active Problems:   Pulmonary hypertension (HCC)   Pulmonary edema   Acute on chronic respiratory failure with hypoxemia (HCC)   Right flank pain   Malnutrition of moderate degree    Time spent: 25 min    [place] [facility] Pager [phone]. If 7PM-7AM,  please contact night-coverage at [email or web address], password [facility] 12/09/2015, 1:53 PM  LOS: 0 days

## 2015-12-10 DIAGNOSIS — Z515 Encounter for palliative care: Secondary | ICD-10-CM

## 2015-12-10 DIAGNOSIS — E440 Moderate protein-calorie malnutrition: Secondary | ICD-10-CM

## 2015-12-10 MED ORDER — FUROSEMIDE 20 MG PO TABS: 20.0000 mg | ORAL_TABLET | Freq: Every day | ORAL | Status: AC

## 2015-12-10 MED ORDER — FUROSEMIDE 20 MG PO TABS
20.0000 mg | ORAL_TABLET | Freq: Every day | ORAL | Status: DC
  Administered 2015-12-10: 20 mg via ORAL
  Filled 2015-12-10: qty 1

## 2015-12-10 MED ORDER — TRAMADOL HCL 50 MG PO TABS: 50.0000 mg | ORAL_TABLET | Freq: Two times a day (BID) | ORAL | Status: AC | PRN

## 2015-12-10 MED ORDER — ALPRAZOLAM 0.25 MG PO TABS: 0.2500 mg | ORAL_TABLET | Freq: Two times a day (BID) | ORAL | Status: AC | PRN

## 2015-12-10 NOTE — Progress Notes (Signed)
Daily Progress Note   Patient Name: [name] [name]       Date: 12/10/2015 DOB: May 06, 1946  Age: 70 y.o. MRN#: [id number] Attending Physician: Geradine Girt, DO Primary Care Physician: Tivis Ringer, MD Admit Date: 12/09/2015  Reason for Consultation/Follow-up: Non pain symptom management  Subjective:  Resting in bed no acute complaints quite awake alert Interval Events:  Discussed with Dr. Lucianne Lei, patient's stepdaughter present at the bedside  Patient has hospice and palliative care center of [facility]:  3 with Dr. Jeanette Caprice impression that patient likely having delirium/sundowning at home. Hence, discussed and agreed with recommendations for surgical: Roxanol at home-see below Patient okay for discharge back to home with hospice with palliative standpoint  Length of Stay: 1 day  Current Medications: Scheduled Meds:  . feeding supplement (ENSURE ENLIVE)  237 mL Oral BID BM  . furosemide  20 mg Oral Daily  . levothyroxine  75 mcg Oral QAC breakfast  . lidocaine  1 patch Transdermal Q24H  . mycophenolate  1,000 mg Oral BID  . predniSONE  10 mg Oral Q breakfast  . predniSONE  5 mg Oral QPC lunch  . senna-docusate  1 tablet Oral BID  . sulfamethoxazole-trimethoprim  1 tablet Oral Once per day on Mon Wed Fri  . Tadalafil (PAH)  20 mg Oral Daily  . traZODone  50 mg Oral QHS    Continuous Infusions:    PRN Meds: ALPRAZolam, traMADol  Physical Exam: Physical Exam             Awake alert in much better spirits sitting up eating breakfast no acute distress S1-S2 Diminished breath sounds Abdomen soft No edema Nonfocal  Vital Signs: BP 118/60 mmHg  Pulse 90  Temp(Src) 97.5 F (36.4 C) (Oral)  Resp 18  Ht 5' (1.524 m)  Wt 45 kg (99 lb 3.3 oz)  BMI 19.38 kg/m2  SpO2  92% SpO2: SpO2: 92 % O2 Device: O2 Device: Nasal Cannula O2 Flow Rate: O2 Flow Rate (L/min): 6 L/min  Intake/output summary:  Intake/Output Summary (Last 24 hours) at 12/10/15 1019 Last data filed at 12/10/15 0900  Gross per 24 hour  Intake    720 ml  Output   1350 ml  Net   -630 ml   LBM: Last BM Date: 12/08/15 Baseline Weight: Weight: 45 kg (  99 lb 3.3 oz) Most recent weight: Weight: 45 kg (99 lb 3.3 oz)       Palliative Assessment/Data: Flowsheet Rows        Most Recent Value   Intake Tab    Referral Department  Hospitalist   Unit at Time of Referral  Med/Surg Unit   Palliative Care Primary Diagnosis  Pulmonary   Date Notified  12/09/15   Palliative Care Type  New Palliative care   Reason for referral  Pain, Non-pain Symptom   Date of Admission  12/09/15   # of days IP prior to Palliative referral  0   Clinical Assessment    Palliative Performance Scale Score  40%   Pain Max last 24 hours  7   Pain Min Last 24 hours  6   Dyspnea Max Last 24 Hours  7   Dyspnea Min Last 24 hours  6   Psychosocial & Spiritual Assessment    Palliative Care Outcomes    Patient/Family meeting held?  Yes   Who was at the meeting?  patient, daughter over the phone   Palliative Care Outcomes  Clarified goals of care, Counseled regarding hospice   Palliative Care follow-up planned  Yes, Facility      Additional Data Reviewed: CBC    Component Value Date/Time   WBC 9.3 12/09/2015 0927   WBC 13.6* 11/18/2014 1027   RBC 5.08 12/09/2015 0927   RBC 5.62* 11/18/2014 1027   HGB 14.6 12/09/2015 0927   HCT 45.1 12/09/2015 0927   PLT 162 12/09/2015 0927   MCV 88.8 12/09/2015 0927   MCH 28.7 12/09/2015 0927   MCH 28.5 11/18/2014 1027   MCHC 32.4 12/09/2015 0927   MCHC 33.0 11/18/2014 1027   RDW 16.9* 12/09/2015 0927   RDW 14.9 11/18/2014 1027   LYMPHSABS 0.4* 10/19/2015 1614   LYMPHSABS 1.6 11/18/2014 1027   MONOABS 0.7 10/19/2015 1614   EOSABS 0.0 10/19/2015 1614   EOSABS 0.2  11/18/2014 1027   BASOSABS 0.0 10/19/2015 1614   BASOSABS 0.1 11/18/2014 1027    CMP     Component Value Date/Time   NA 138 12/09/2015 0927   K 4.4 12/09/2015 0927   CL 102 12/09/2015 0927   CO2 25 12/09/2015 0927   GLUCOSE 81 12/09/2015 0927   BUN 21* 12/09/2015 0927   CREATININE 0.81 12/09/2015 0927   CALCIUM 8.8* 12/09/2015 0927   PROT 6.4* 12/09/2015 0927   ALBUMIN 3.7 12/09/2015 0927   AST 37 12/09/2015 0927   ALT 68* 12/09/2015 0927   ALKPHOS 212* 12/09/2015 0927   BILITOT 0.9 12/09/2015 0927   GFRNONAA >60 12/09/2015 0927   GFRAA >60 12/09/2015 0927       Problem List:  Patient Active Problem List   Diagnosis Date Noted  . Pulmonary edema 12/09/2015  . Acute encephalopathy 12/09/2015  . Acute on chronic respiratory failure with hypoxemia ([place]) 12/09/2015  . Right flank pain 12/09/2015  . Malnutrition of moderate degree 12/09/2015  . Acute pulmonary edema (HCC)   . Encounter for palliative care   . Goals of care, counseling/discussion   . Hyponatremia 10/19/2015  . Hypokalemia 10/19/2015  . Chronic hypoxemic respiratory failure ([place]) 08/10/2015  . CAD in native artery 06/24/2015  . Pulmonary hypertension ([place]) 05/26/2015  . PVD (peripheral vascular disease) ([place]) 08/13/2014  . Occlusion and stenosis of carotid artery without mention of cerebral infarction 01/20/2014  . Interstitial lung disease ([place]) 07/04/2013  . Hyperlipidemia   . Peripheral vascular disease, unspecified (  [place]) 07/09/2012  . Atherosclerosis of native arteries of extremity with intermittent claudication ([place]) 05/14/2012  . PAD (peripheral artery disease) ([place]) 09/07/2011     Palliative Care Assessment & Plan    1.Code Status:  DNR    Code Status Orders        Start     Ordered   12/09/15 0825  Do not attempt resuscitation (DNR)   Continuous    Question Answer Comment  In the event of cardiac or respiratory ARREST Do not call a "code blue"   In the event of cardiac or  respiratory ARREST Do not perform Intubation, CPR, defibrillation or ACLS   In the event of cardiac or respiratory ARREST Use medication by any route, position, wound care, and other measures to relive pain and suffering. May use oxygen, suction and manual treatment of airway obstruction as needed for comfort.      12/09/15 0824    Code Status History    Date Active Date Inactive Code Status Order ID Comments User Context   10/19/2015  7:16 PM 10/23/2015  3:21 PM Full Code [id number]  Geradine Girt, DO Inpatient   08/11/2013  7:43 PM 08/14/2013  3:17 PM Full Code [id number]  Ellwood Handler, PA-C Inpatient   06/17/2012  3:58 PM 06/19/2012 12:15 PM Full Code [id number]  Karen Kays, RN Inpatient   01/17/2012  6:55 PM 01/19/2012  3:13 PM Full Code [id number]  Liana Gerold, RN Inpatient   09/11/2011  6:33 PM 09/14/2011  1:41 PM Full Code [id number]  Kerman Passey Ward, RN Inpatient    Advance Directive Documentation        Most Recent Value   Type of Advance Directive  Healthcare Power of Attorney, Living will   Pre-existing out of facility DNR order (yellow form or pink MOST form)     "MOST" Form in Place?         2. Goals of Care/Additional Recommendations:     Limitations on Scope of Treatment:   Desire for further Chaplaincy support:no  Psycho-social Needs: Caregiving  Support/Resources  3. Symptom Management:      1. Discuss with Dr. Lucianne Lei: Recommend for hospice nurses to initiate low-dose Seroquel 12.5 mg every at bedtime if ongoing sundowning/delirium occurs at home. Recommend discontinuing Norco and starting Roxanol 5 mg sublingual every 2-3 hours on an as-needed basis for pain/shortness of breath in the outpatient setting. Patient is to be discharged home with hospice and has [facility].  4. Palliative Prophylaxis:   Delirium Protocol  5. Prognosis: < 3 months  6. Discharge Planning:  Home with Hospice   Care plan was discussed with  Patient daughter Dr Eliseo Squires  Thank you for  allowing the Palliative Medicine Team to assist in the care of this patient.   Time In:  9 Time Out: 925 Total Time 25 Prolonged Time Billed  no        [phone]  Loistine Chance, MD  12/10/2015, 10:19 AM  Please contact Palliative Medicine Team phone at [phone] for questions and concerns.

## 2015-12-10 NOTE — Progress Notes (Signed)
Inpatient [place] [id number] [facility] RN Visit Gip related admission to [facility] Dx of Interstitial Lung disease. Pt. Is a DNR code status. Patient sitting up in bed smiling when liaison entered room. She reports feeling better. Her stepdaughter has gone home to get pt's clothes and will bring an oxygen tank for transport. Patient is on O2 at [facility]. Resps regular and unlabored. [facility] will f/u with patient at home tomorrow after discharge today. Please call with any questions.  [facility] Liaison [phone]

## 2015-12-10 NOTE — Discharge Summary (Signed)
Physician Discharge Summary  [name] [name] [id number] DOB: Dec 20, 1945 DOA: 12/09/2015  PCP: Tivis Ringer, MD  Admit date: 12/09/2015 Discharge date: 12/10/2015  Time spent: 35 minutes  Recommendations for Outpatient Follow-up:  1. Resume home hospice-- suspect patient has some sundowning as daughter states she only c/o pain at night after dinner--- will use xanax for now but amy need transition over to low dose seroquel (12.5mg  QHS), also would use roxinol for pain if ultram not effective: Roxanol 5 mg sublingual every 2-3 hours on an as-needed basis for pain/shortness of breath in the outpatient setting 2. Resume home O2   Discharge Diagnoses:  Principal Problem:   Acute encephalopathy Active Problems:   Pulmonary hypertension (HCC)   Pulmonary edema   Acute on chronic respiratory failure with hypoxemia (HCC)   Right flank pain   Malnutrition of moderate degree   Encounter for palliative care   Goals of care, counseling/discussion   Discharge Condition: improved  Diet recommendation: as tolerated- low salt if able  [facility] Weights   12/09/15 0817  Weight: 45 kg (99 lb 3.3 oz)    History of present illness:  [name] [name] is a 70 y.o. female with history of pulmonary hypertension and interstitial lung disease, hypothyroidism and peripheral arterial disease who was brought to the ER after patient started complaining of right flank pain last midnight. In the ER patient was found to be confused and hypoxic. Patient is usually on 6 L of oxygen and had required nonrebreather. Chest x-ray was showing congestion. CT head shows nonspecific changes which may require MRI of the brain for further assessment. As per patient's daughter patient is under hospice and over the last 2 days was given pain medication for generalized body ache and had taken hydrocodone. In the ER after getting Narcan 0.4 mg IV 1 dose patient's mental status improved. Patient's oxygen requirement decreased after Lasix  40 mg IV was given and patient had 850 a.m. on urine output. Patient will be admitted for further management. On exam patient does have lower extremity edema and right flank tenderness. As per patient daughter patient did have a fall 3 weeks ago. Denies any nausea vomiting or diarrhea. Denies any chest pain or productive cough.   Hospital Course:  Acute encephalopathy - suspect most likely secondary to patient's sedative medications including trazodone and Xanax and recently had pain medications.  Patient did improve after given Narcan.  Hospice at home MRI without acute stroke  Acute on chronic respiratory failure secondary to pulmonary edema in a patient with known history of pulmonary hypertension and interstitial lung disease -  Lasix back to 20 mg daily-- adjust based on weight and symptoms and labs -continue on other medications for interstitial lung disease including Adcirca, CellCept and prednisone. Patient is on Bactrim 3 times a week.  RUQ pain - patient has significant tenderness on the right flank. -not over ribs -comes and goes-- not sign of zoster rash-- family counseled to continue to monitor  Hypothyroidism - on Synthroid.  Procedures:    Consultations:  Palliative care  Discharge Exam: Filed Vitals:   12/10/15 0310 12/10/15 0550  BP: 113/61 118/60  Pulse: 91 90  Temp:    Resp: 16 18    General: much improved today   Discharge Instructions   Discharge Instructions    Diet - low sodium heart healthy    Complete by:  As directed      Discharge instructions    Complete by:  As directed  Resume home hospice Weigh daily Would try xanax at dinner -hospice may consider low dose seroqeul for "sundowning"     Increase activity slowly    Complete by:  As directed           Current Discharge Medication List    START taking these medications   Details  traMADol (ULTRAM) 50 MG tablet Take 1 tablet (50 mg total) by mouth every 12 (twelve) hours as  needed for moderate pain. Qty: 30 tablet, Refills: 0      CONTINUE these medications which have CHANGED   Details  ALPRAZolam (XANAX) 0.25 MG tablet Take 1 tablet (0.25 mg total) by mouth 2 (two) times daily as needed for anxiety. Qty: 30 tablet, Refills: 0    furosemide (LASIX) 20 MG tablet Take 1 tablet (20 mg total) by mouth daily. Qty: 30 tablet, Refills: 0      CONTINUE these medications which have NOT CHANGED   Details  ibuprofen (ADVIL,MOTRIN) 200 MG tablet Take 200 mg by mouth every 6 (six) hours as needed for moderate pain.    levothyroxine (SYNTHROID, LEVOTHROID) 75 MCG tablet Take 75 mcg by mouth daily before breakfast.    mycophenolate (CELLCEPT) 500 MG tablet Take 2 tablets (1,000 mg total) by mouth 2 (two) times daily. Qty: 120 tablet, Refills: 5    potassium chloride (K-DUR) 10 MEQ tablet Take 1 tablet (10 mEq total) by mouth daily. Qty: 30 tablet, Refills: 5    predniSONE (DELTASONE) 10 MG tablet TAKE AS DIRECTED Qty: 45 tablet, Refills: 5    sulfamethoxazole-trimethoprim (BACTRIM DS,SEPTRA DS) 800-160 MG tablet Take 1 tablet by mouth 3 (three) times a week. Qty: 12 tablet, Refills: 5    Tadalafil, PAH, (ADCIRCA) 20 MG TABS Take 1 tablet (20 mg total) by mouth daily. Qty: 30 tablet, Refills: 6    traZODone (DESYREL) 50 MG tablet Take 1 tablet (50 mg total) by mouth at bedtime. DO NOT TAKE THE SAME DAY YOU TAKE AMBIEN Qty: 50 tablet, Refills: 1    Elastic Bandages & Supports (MEDICAL COMPRESSION THIGH HIGH) MISC 36mmhg -wear daily Qty: 2 each, Refills: 0      STOP taking these medications     HYDROcodone-acetaminophen (NORCO/VICODIN) 5-325 MG tablet      zolpidem (AMBIEN) 5 MG tablet        Allergies  Allergen Reactions  . Morphine And Related Nausea And Vomiting   Follow-up Information    Follow up with Tivis Ringer, MD.   Specialty:  Internal Medicine   Why:  As needed   Contact information:   [address] [place]  [address] [phone]        The results of significant diagnostics from this hospitalization (including imaging, microbiology, ancillary and laboratory) are listed below for reference.    Significant Diagnostic Studies: Ct Abdomen Pelvis Wo Contrast  12/09/2015  CLINICAL DATA:  Right flank pain for 1 week. No nausea or vomiting. No diarrhea. EXAM: CT ABDOMEN AND PELVIS WITHOUT CONTRAST TECHNIQUE: Multidetector CT imaging of the abdomen and pelvis was performed following the standard protocol without IV contrast. COMPARISON:  None. FINDINGS: Lower chest: Bilateral lower lung chronic interstitial lung disease with interstitial fibrosis. Trace pericardial effusion. Cardiomegaly. Coronary artery atherosclerosis in the circumflex. Hepatobiliary: Normal liver.  Normal gallbladder. Pancreas: Normal. Spleen: Normal. Adrenals/Urinary Tract: Normal adrenal glands. Normal kidneys. No urolithiasis or obstructive uropathy. Normal bladder. Stomach/Bowel: No bowel wall thickening or dilatation. No pneumatosis, pneumoperitoneum or portal venous gas. Moderate amount of stool throughout  the colon. Prior appendectomy. No abdominal or pelvic free fluid. Vascular/Lymphatic: Normal caliber abdominal aorta. Aorto-bifemoral bypass graft. Reproductive: Prior hysterectomy. Other: No fluid collection or hematoma. Musculoskeletal: No acute osseous abnormality. No lytic or sclerotic osseous lesion. Bilateral facet arthropathy at L4-5 and L5-S1. IMPRESSION: 1. No acute abdominal or pelvic pathology. 2. Bilateral lower lobe chronic interstitial lung disease with interstitial fibrosis. 3. Trace pericardial effusion. Electronically Signed   By: Kathreen Devoid   On: 12/09/2015 07:56   Dg Chest 2 View  12/09/2015  CLINICAL DATA:  Acute onset of shortness of breath. Initial encounter. EXAM: CHEST  2 VIEW COMPARISON:  Chest radiograph performed 10/22/2015 FINDINGS: The lungs are well-aerated. Mild vascular congestion is noted. Mild bibasilar  opacities may reflect mild interstitial edema. Small bilateral pleural effusions are suspected. No pneumothorax is seen. The heart is mildly enlarged. No acute osseous abnormalities are seen. IMPRESSION: Mild vascular congestion and mild cardiomegaly. Mild bibasilar opacities may reflect mild interstitial edema. Suspect small bilateral pleural effusions. Electronically Signed   By: Garald Balding M.D.   On: 12/09/2015 04:13   Ct Head Wo Contrast  12/09/2015  CLINICAL DATA:  Initial evaluation for acute hypoxia. Altered mental status EXAM: CT HEAD WITHOUT CONTRAST TECHNIQUE: Contiguous axial images were obtained from the base of the skull through the vertex without intravenous contrast. COMPARISON:  None. FINDINGS: Mild diffuse prominence of the CSF containing spaces consistent with generalized cerebral atrophy. Patchy and confluent hypodensity within the periventricular and deep white matter both cerebral hemispheres most consistent with chronic small vessel ischemic disease. Prominent vascular calcifications present within the carotid siphons. 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 There is somewhat more a age indeterminate hypodensity with loss of gray-white matter differentiation within the left parietal lobe (series 2, image 19). Otherwise, gray-white matter differentiation maintained without evidence for acute large vessel territory infarct. Deep gray nuclei preserved. No acute intracranial hemorrhage. No mass lesion, midline shift, or mass effect. No hydrocephalus. Basilar cisterns are patent. No extra-axial fluid collection. Scalp soft tissues demonstrate no acute abnormality. No acute abnormality about the globes and orbits. Sequela prior bilateral lens extraction noted. Paranasal sinuses are clear.  No mastoid effusion. Calvarium intact. IMPRESSION: 1. Age indeterminate hypodensity within the left parietal lobe as above. Further evaluation with MRI is  suggested if there is clinical concern for possible acute/subacute ischemia. 2. 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 3. Age-related cerebral atrophy with chronic small vessel ischemic disease. Electronically Signed   By: Jeannine Boga M.D.   On: 12/09/2015 05:25   Mr Brain Wo Contrast  12/09/2015  CLINICAL DATA:  Abnormal head CT with possible left parietal stroke. Confusion and hypoxia. EXAM: MRI HEAD WITHOUT CONTRAST TECHNIQUE: Multiplanar, multiecho pulse sequences of the brain and surrounding structures were obtained without intravenous contrast. COMPARISON:  Head CT 12/09/2015 FINDINGS: There is no evidence of acute infarct, intracranial hemorrhage, mass, midline shift, or extra-axial fluid collection. There are chronic, small infarcts in the left occipital lobe and left cerebellum. Small foci of T2 hyperintensity scattered throughout the cerebral white matter elsewhere are nonspecific but compatible with mild chronic small vessel ischemic disease. There is mild generalized cerebral atrophy. Prior bilateral cataract extraction is noted. Paranasal sinuses and mastoid air cells are clear. Major intracranial vascular flow voids are preserved. IMPRESSION: 1. No acute intracranial abnormality. 2. 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 3. Mild chronic small vessel ischemic disease in  the cerebral white matter. Electronically Signed   By: Logan Bores M.D.   On: 12/09/2015 14:20    Microbiology: No results found for this or any previous visit (from the past 240 hour(s)).   Labs: Basic Metabolic Panel:  Recent Labs Lab 12/06/15 1230 12/09/15 0345 12/09/15 0927  NA 132* 134* 138  K 5.0 5.6* 4.4  CL 97 105 102  CO2 24 19* 25  GLUCOSE 147* 103* 81  BUN 19 21* 21*  CREATININE 0.72 0.76 0.81  CALCIUM 9.3 8.8* 8.8*   Liver Function Tests:  Recent Labs Lab 12/06/15 1230 12/09/15 0927  AST 44* 37  ALT 61*  68*  ALKPHOS 204* 212*  BILITOT 0.9 0.9  PROT 6.5 6.4*  ALBUMIN 3.7 3.7   No results for input(s): LIPASE, AMYLASE in the last 168 hours.  Recent Labs Lab 12/09/15 0535  AMMONIA 16   CBC:  Recent Labs Lab 12/09/15 0345 12/09/15 0927  WBC 9.5 9.3  HGB 14.3 14.6  HCT 43.2 45.1  MCV 87.6 88.8  PLT 181 162   Cardiac Enzymes: No results for input(s): CKTOTAL, CKMB, CKMBINDEX, TROPONINI in the last 168 hours. BNP: BNP (last 3 results)  Recent Labs  12/09/15 0345  BNP 2171.8*    ProBNP (last 3 results) No results for input(s): PROBNP in the last 8760 hours.  CBG: No results for input(s): GLUCAP in the last 168 hours.     Signed:  Geradine Girt DO.  [facility] 12/10/2015, 11:33 AM

## 2015-12-13 ENCOUNTER — Telehealth: Payer: Self-pay | Admitting: Pulmonary Disease

## 2015-12-13 NOTE — Telephone Encounter (Signed)
Spoke with Cristie Hem at [facility] verbal to substitute.  Nothing further was needed.

## 2015-12-13 NOTE — Telephone Encounter (Signed)
Attempted to contact [facility]. No answer, no option to leave a message. Will try back.

## 2015-12-14 NOTE — Telephone Encounter (Signed)
Spoke with [employer]. She will make sure pt has a BMET on Monday.

## 2015-12-14 NOTE — Telephone Encounter (Signed)
Spoke with Varney Biles states that the patient is wanting to know if any labs are needed in addition to the ones done in the hospital. Please advise thanks.

## 2015-12-14 NOTE — Telephone Encounter (Signed)
BMET monday

## 2015-12-22 ENCOUNTER — Telehealth: Payer: Self-pay | Admitting: Pulmonary Disease

## 2015-12-22 NOTE — Telephone Encounter (Signed)
Labs are not in BQ's look-ats or up front in folder. Called Keisha with Hospice and she is re-faxing labs to front fax.   Will forward to [place] to look out for labs.

## 2015-12-23 NOTE — Telephone Encounter (Signed)
Labs looked good Continue current regimen

## 2015-12-23 NOTE — Telephone Encounter (Signed)
Lab results were received. They are in BQ's look at. Will route to BQ to make aware.

## 2015-12-23 NOTE — Telephone Encounter (Signed)
Spoke with [facility] with Hospice. She is aware of BQ's recommendation. Nothing further was needed.

## 2015-12-31 ENCOUNTER — Telehealth: Payer: Self-pay | Admitting: Pulmonary Disease

## 2015-12-31 DIAGNOSIS — J849 Interstitial pulmonary disease, unspecified: Secondary | ICD-10-CM

## 2015-12-31 MED ORDER — METOLAZONE 5 MG PO TABS: ORAL_TABLET | ORAL | Status: DC

## 2015-12-31 NOTE — Telephone Encounter (Signed)
Metolazone 5mg  in the morning before am lasix  Only take one tomorrow and Sunday then [facility] Monday

## 2015-12-31 NOTE — Telephone Encounter (Signed)
Patient's daughter states that: Patient is already taking 40mg  twice daily. (Patient is taking 20mg  tablets, 2 tablets in the morning and 2 in the afternoon.) Patient has taken 40mg  this morning at 9am and another 40mg  at 2pm and patient still has not urinated all day.

## 2015-12-31 NOTE — Telephone Encounter (Signed)
Patient's daughter notified of Dr. Anastasia Pall recommendations. Rx sent to pharmacy. Order entered for BMET. Nothing further needed.

## 2015-12-31 NOTE — Telephone Encounter (Signed)
Spoke with Wells Guiles, pt's hospice nurse. States that pt is has bilateral leg edema. Swelling is spreading up to the pt's thighs. Has been giving pt 2 tabs of lasix in the AM and in the PM. Pt weighed 107lbs this AM. Wells Guiles would like BQ's recommendation.  BQ - please advise. Thanks.

## 2015-12-31 NOTE — Telephone Encounter (Signed)
She is tough to manage with this Have her increase lasix to 40mg  bid and have her get a BMET on Monday We have to be really careful with her electrolytes

## 2016-01-07 ENCOUNTER — Encounter: Payer: Self-pay | Admitting: Pulmonary Disease

## 2016-01-07 ENCOUNTER — Ambulatory Visit (INDEPENDENT_AMBULATORY_CARE_PROVIDER_SITE_OTHER): Admitting: Pulmonary Disease

## 2016-01-07 VITALS — BP 134/66 | HR 83 | Ht 62.0 in | Wt 111.8 lb

## 2016-01-07 DIAGNOSIS — I272 Other secondary pulmonary hypertension: Secondary | ICD-10-CM

## 2016-01-07 DIAGNOSIS — J849 Interstitial pulmonary disease, unspecified: Secondary | ICD-10-CM | POA: Diagnosis not present

## 2016-01-07 NOTE — Assessment & Plan Note (Signed)
Nearing the end I'm afraid, she knows this as well.  Continue hospice management.

## 2016-01-07 NOTE — Assessment & Plan Note (Signed)
She continues to struggle with swelling. Unfortunately, we have been limited with diuretics because they have led to hyponatremia and altered mental status. Recently, it sounds as if she has become tolerant to Lasix. She and her family have also struggled with the effect of diuretics as she is very weak and just going to the bathroom is tiresome.  Plan: Continue Adcirca Try 80 mg Lasix daily, not twice a day, effects and effective than try 120 mg daily. If those doses of Lasix are ineffective, then I recommend that she take metolazone 30 minutes prior to administration of Lasix at a 40 mg twice a day dose Her daughters, call us next week to report back on this She may need to have a basic metabolic panel one she starts actually diuresing, we will gauge the timing on that her effect of the drugs Follow-up 4-6 weeks or sooner if needed

## 2016-01-07 NOTE — Patient Instructions (Signed)
I would recommend that you titrate your Lasix to effect, specifically tried taking 80 mg daily to see if this helps to urinate more. If that does not been try taking 120 mg daily. If that dose is ineffective then let me know. I do not recommend that she take either of these doses twice a day, just once a day. We will see you back in 4-6 weeks or sooner if needed, overbook okay

## 2016-01-07 NOTE — Progress Notes (Signed)
Subjective:    Patient ID: [name] [name], female    DOB: 02-Jun-1946, 70 y.o.   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  CXR 11/2012: mild cm, no acute process.  Muscle pressures 04/2013 normal HRCT 04/2013:  IS/GG changes with nonspecific pattern  Autoimmune panel 06/2013: normal VATS bx 07/2013:  Not c/w UIP, most c/w fibrosing variant of NSIP.  Has scattered multinucleated giant cells. Pt denies any exposure hx, unusual pets, unusual hobbies. HP panel 08/2013:  Not overly impressive.  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  Pt has done an environmental survey at home.  02/2014:  New complaint of joint swelling and pain >> refer to rheum Amil Amen).  Cellcept started 09/15/14 She had a lung transplant evaluation at [facility] in 2016 but failed transplant evaluation due to peripheral vascular disease.   HPI Chief Complaint  Patient presents with  . Follow-up    pt has good and bad days- c/o sob with minimal exertion, nonprod cough.    [name] [name] was in the hospital about a month ago but now she is back out.   She has been having trouble with her swelling again.  She self titrated up to 40mg  lasix twice a day again. We recommended zaroxlyn but they were hesisitant to do this because it takes so much out of her to get up and move around. They say that the lasix doesn't help her urinate much.    Past Medical History  Diagnosis Date  . Peripheral arterial disease ([place])   . Hyperlipidemia   . Thyroid disease     Hypothyroidism  . Hypothyroidism   . Blood transfusion     2 /12 yrs ago  . Arthritis   . Arrhythmia     when hyperthyroid  . Dyspnea on exertion 01/22/2013  . GERD (gastroesophageal reflux disease)     otc  . Carotid artery occlusion       Review of Systems  Constitutional: Negative for fever, chills and fatigue.  HENT: Negative for nosebleeds,  postnasal drip and rhinorrhea.   Respiratory: Positive for cough and shortness of breath. Negative for wheezing.   Cardiovascular: Negative for chest pain, palpitations and leg swelling.       Objective:   Physical Exam Filed Vitals:   01/07/16 1405  BP: 134/66  Pulse: 83  Height: 5\' 2"  (1.575 m)  Weight: 111 lb 12.8 oz (50.712 kg)  O2 sat 90% 8 L [place]  Gen: chronically ill appearing HENT: OP clear, TM's clear, neck supple PULM: Crackles bilaterally, normal percussion CV: RRR, no mgr, significant leg edema GI: BS+, soft, nontender Derm: no cyanosis or rash Psyche: normal mood and affect  2016 [facility]: RA: 12 RV: 68 12 14  PA: 68 26 41 PCW: 22 23 22  Oximetry Site Hgb (g/dL) O2 sat (%)  FA: 14.1 97.3  PA: 14.3 70.3  PA: 14.3 72.1  Calculations O2 consumption: 164.1 ML O2/min (assumed fick) EPBF (Qep): 3.3 L/min  Mean Hgb: 14.2 g/dl Shunt ratio: 1.0 (Qp/Qs)  AV O2: 5.0 Vol% L to R shunt: 0.0 L/min (Qp-Qep)  PBF (Qp): 3.3 L/min R to L shunt: 0.0 L/min (Qs-Qep)  Cardiac output (Qs): 3.3 L/min  Cardiac Index: 2.2 L/min/m2  PVR: 5.8 wood units  SVR: 29.5 wood units   Her hospital records from last week were reviewed where she was admitted for  hyponatremia. She was treated with saline initially and then eventually put back on diuretics by hospital discharge.  BMET    Component Value Date/Time   NA 138 12/09/2015 0927   K 4.4 12/09/2015 0927   CL 102 12/09/2015 0927   CO2 25 12/09/2015 0927   GLUCOSE 81 12/09/2015 0927   BUN 21* 12/09/2015 0927   CREATININE 0.81 12/09/2015 0927   CALCIUM 8.8* 12/09/2015 0927   GFRNONAA >60 12/09/2015 0927   GFRAA >60 12/09/2015 0927        Assessment & Plan:  Pulmonary hypertension ([place]) She continues to struggle with swelling. Unfortunately, we have been limited with diuretics because they have led to hyponatremia and altered mental status. Recently, it sounds as if she has become tolerant to Lasix. She and her family have  also struggled with the effect of diuretics as she is very weak and just going to the bathroom is tiresome.  Plan: Continue Adcirca Try 80 mg Lasix daily, not twice a day, effects and effective than try 120 mg daily. If those doses of Lasix are ineffective, then I recommend that she take metolazone 30 minutes prior to administration of Lasix at a 40 mg twice a day dose Her daughters, call us next week to report back on this She may need to have a basic metabolic panel one she starts actually diuresing, we will gauge the timing on that her effect of the drugs Follow-up 4-6 weeks or sooner if needed  Interstitial lung disease ([place]) Nearing the end I'm afraid, she knows this as well.  Continue hospice management.  > 25 minutes spent in direct consultation as part of a 41 minute visit   Current outpatient prescriptions:  .  ALPRAZolam (XANAX) 0.25 MG tablet, Take 1 tablet (0.25 mg total) by mouth 2 (two) times daily as needed for anxiety., Disp: 30 tablet, Rfl: 0 .  Elastic Bandages & Supports (MEDICAL COMPRESSION THIGH HIGH) MISC, 16mmhg -wear daily, Disp: 2 each, Rfl: 0 .  furosemide (LASIX) 20 MG tablet, Take 1 tablet (20 mg total) by mouth daily., Disp: 30 tablet, Rfl: 0 .  ibuprofen (ADVIL,MOTRIN) 200 MG tablet, Take 200 mg by mouth every 6 (six) hours as needed for moderate pain., Disp: , Rfl:  .  levothyroxine (SYNTHROID, LEVOTHROID) 75 MCG tablet, Take 75 mcg by mouth daily before breakfast., Disp: , Rfl:  .  metolazone (ZAROXOLYN) 5 MG tablet, Take 1 tablet Sat morning before AM Lasix, then 1 tab Sun AM before AM Lasix, then LABS on Mon, Disp: 2 tablet, Rfl: 0 .  morphine (ROXANOL) 20 MG/ML concentrated solution, Take 5 mg by mouth every 2 (two) hours as needed for severe pain., Disp: , Rfl:  .  mycophenolate (CELLCEPT) 500 MG tablet, Take 2 tablets (1,000 mg total) by mouth 2 (two) times daily., Disp: 120 tablet, Rfl: 5 .  potassium chloride (K-DUR) 10 MEQ tablet, Take 1 tablet (10 mEq  total) by mouth daily., Disp: 30 tablet, Rfl: 5 .  predniSONE (DELTASONE) 10 MG tablet, TAKE AS DIRECTED, Disp: 45 tablet, Rfl: 5 .  QUEtiapine (SEROQUEL) 25 MG tablet, Take 25 mg by mouth 2 (two) times daily., Disp: , Rfl:  .  sulfamethoxazole-trimethoprim (BACTRIM DS,SEPTRA DS) 800-160 MG tablet, Take 1 tablet by mouth 3 (three) times a week., Disp: 12 tablet, Rfl: 5 .  Tadalafil, PAH, (ADCIRCA) 20 MG TABS, Take 1 tablet (20 mg total) by mouth daily., Disp: 30 tablet, Rfl: 6 .  traMADol (ULTRAM) 50 MG tablet, Take  1 tablet (50 mg total) by mouth every 12 (twelve) hours as needed for moderate pain., Disp: 30 tablet, Rfl: 0 .  traZODone (DESYREL) 50 MG tablet, Take 1 tablet (50 mg total) by mouth at bedtime. DO NOT TAKE THE SAME DAY YOU TAKE AMBIEN, Disp: 50 tablet, Rfl: 1

## 2016-01-12 ENCOUNTER — Telehealth: Payer: Self-pay | Admitting: Pulmonary Disease

## 2016-01-12 MED ORDER — METOLAZONE 5 MG PO TABS: ORAL_TABLET | ORAL | Status: AC

## 2016-01-12 NOTE — Telephone Encounter (Signed)
Home health nurse is aware of BQ's recommendations.

## 2016-01-12 NOTE — Telephone Encounter (Signed)
Last ov on 01/07/16 with BQ  Patient Instructions     I would recommend that you titrate your Lasix to effect, specifically tried taking 80 mg daily to see if this helps to urinate more. If that does not been try taking 120 mg daily. If that dose is ineffective then let me know. I do not recommend that she take either of these doses twice a day, just once a day. We will see you back in 4-6 weeks or sooner if needed   Called and spoke with pt's daughter. She states that the pt is currently up to 120mg  of lasix and that BQ instructed her to call him if this did not help. She states that he pt has gained 5lbs since seeing BQ on 01/07/16. She is requesting a message be sent ot BQ for further recs. I explained to her that I would send a message and return her call with his recs. She voiced understanding and had no further questions.   BQ please advise

## 2016-01-12 NOTE — Telephone Encounter (Signed)
Take metolazone 5 mg daily for the next 3 days, take Lasix 40 mg twice a day. Metolazone should be taken 30 minutes prior to the first dose of Lasix.

## 2016-01-12 NOTE — Telephone Encounter (Signed)
lmtcb

## 2016-01-20 ENCOUNTER — Telehealth: Payer: Self-pay | Admitting: Pulmonary Disease

## 2016-01-20 NOTE — Telephone Encounter (Signed)
Order has been received, but BQ has not been in office to sign order.  Order is sitting in BQ's look-at folder to be signed when he returns to office on Monday.

## 2016-01-20 NOTE — Telephone Encounter (Signed)
Spoke with [place] with [facility] order for treatment dated 12/27/15 - faxed several times to our office.  "Skin tear" and dressing instructions. Needing this back ASAP  Please advise if you have seen an order regarding wound care for this patient?  Form being refaxed to our office, marked Urgent ATTN: Caryl Pina. Will send to [place] to keep an eye out.

## 2016-01-24 NOTE — Telephone Encounter (Signed)
Forms signed and faxed back to hospice-confirmation fax received.  Nothing further needed.

## 2016-02-15 ENCOUNTER — Telehealth: Payer: Self-pay | Admitting: Pulmonary Disease

## 2016-02-15 NOTE — Telephone Encounter (Signed)
Per BQ- is pt wants to be seen or they feel like she needs to be seen we can still see her, but this is not necessary  I have spoke with her daughter and notified her of this  She states she and the pt prefer to cancel this appt  She is okay at home with Hospice checking on her and feels appt would be difficult to make since her breathing is progressively worse with exertion She is comfortable at rest  She will call at needed  Best wishes given to pt  Will forward to BQ to make him aware

## 2016-02-16 ENCOUNTER — Ambulatory Visit: Admitting: Pulmonary Disease

## 2016-02-18 ENCOUNTER — Ambulatory Visit: Admitting: Emergency Medicine

## 2016-02-18 ENCOUNTER — Ambulatory Visit: Admitting: Pulmonary Disease

## 2016-02-24 ENCOUNTER — Telehealth: Payer: Self-pay | Admitting: Pulmonary Disease

## 2016-02-24 ENCOUNTER — Other Ambulatory Visit: Payer: Self-pay | Admitting: Pulmonary Disease

## 2016-02-24 NOTE — Telephone Encounter (Signed)
Spoke with Varney Biles, states that the patient is having 2+ edema of bilateral exetremities and abdominal distention.  She wanted to know can she give the patient Metolazone for the next 3 days - currently on the med list to use PRN in addition to Lasix if needed.  Varney Biles wanted to check with BQ to see if he wanted her to take just one day or if she could take the Metolazone for the next three days.  Notes some increased SOB also.  Please advise Dr [place] Bells. Thanks.

## 2016-02-24 NOTE — Telephone Encounter (Signed)
Would just do two days of metolazone 30 min prior to lasix

## 2016-02-24 NOTE — Telephone Encounter (Signed)
Spoke with [facility] @ Hospice and gave BQ's recommendations. She voiced understanding. Nothing further needed.

## 2016-02-25 IMAGING — CT CT ABD-PELV W/O CM
2 of 3 series · 15 of 28 positions shown, 17 images · non-contrast
Comparison: None.

CLINICAL DATA: Right flank pain for 1 week. No nausea or vomiting.
No diarrhea.

EXAM:
CT ABDOMEN AND PELVIS WITHOUT CONTRAST
TECHNIQUE: Multidetector CT imaging of the abdomen and pelvis was performed
following the standard protocol without IV contrast.

[Series 4: coronal · coronal · 0.68mm/px · 3 of 73 slices shown]
[im 25/73  soft-tissue]
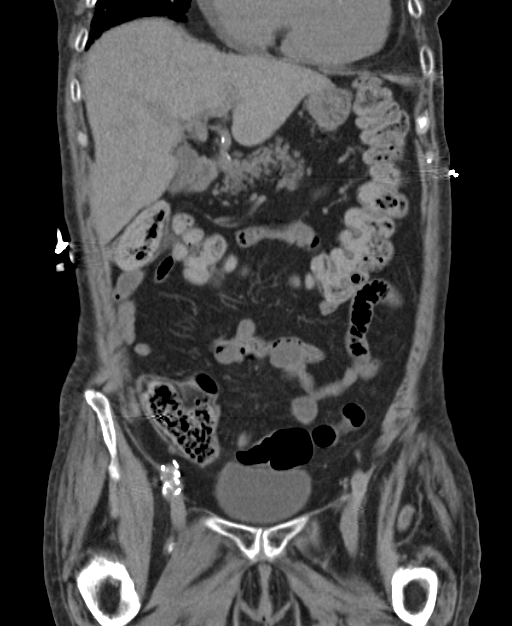
[im 33/73  soft-tissue]
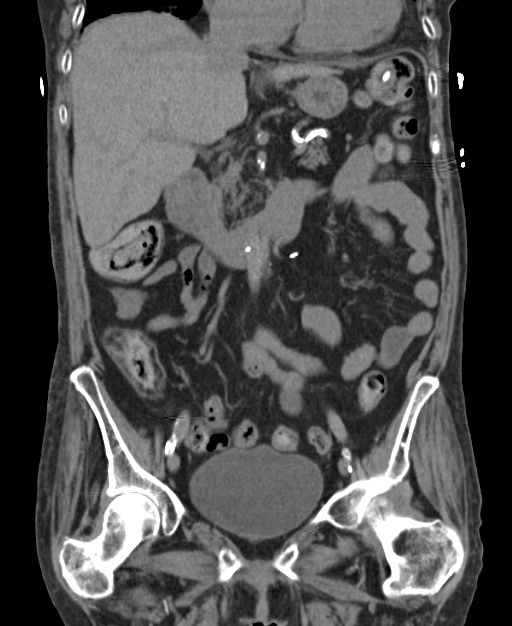
[im 41/73  soft-tissue]
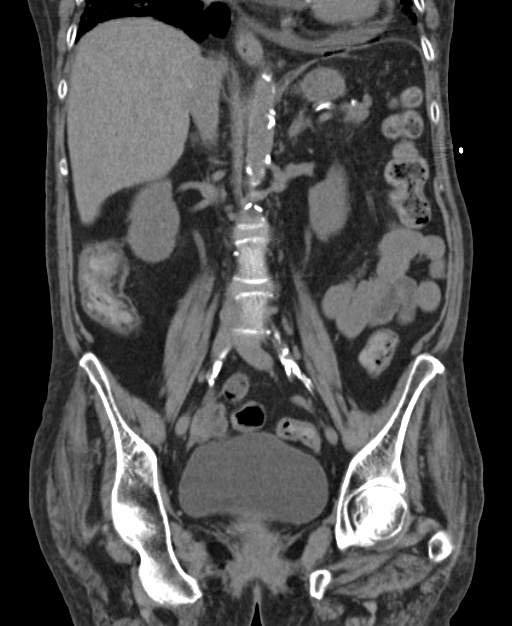

[Series 6: lung · axial · 0.74mm/px · z∈[-49,+6]mm · 12 of 14 slices shown, 14 images]
[im 2/14  soft-tissue]
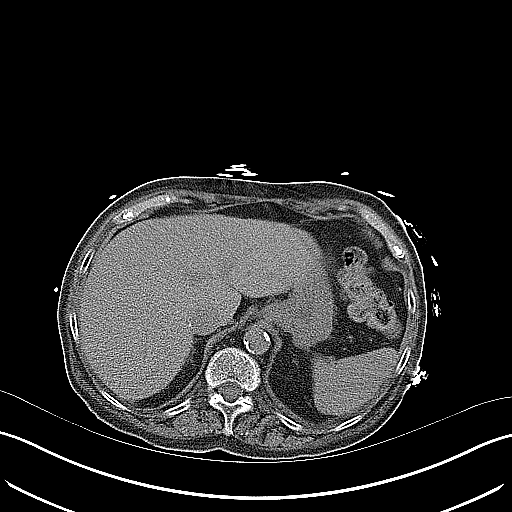
[im 2/14  bone]
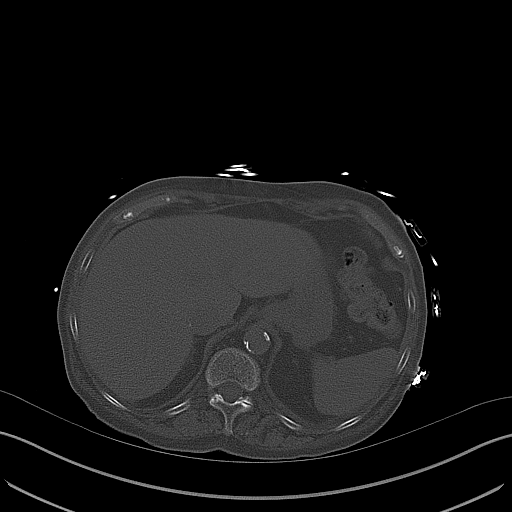
[im 3/14  soft-tissue]
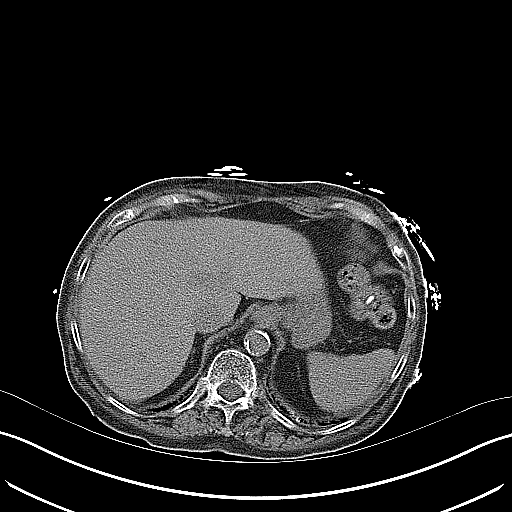
[im 4/14  soft-tissue]
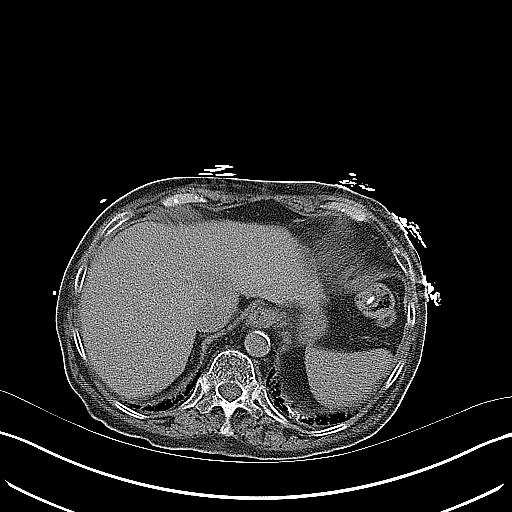
[im 5/14  soft-tissue]
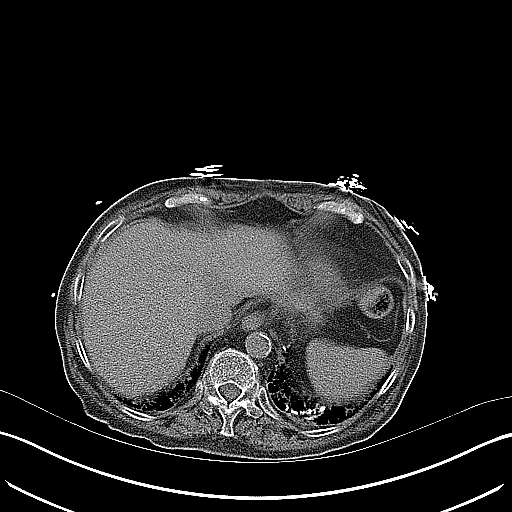
[im 6/14  soft-tissue]
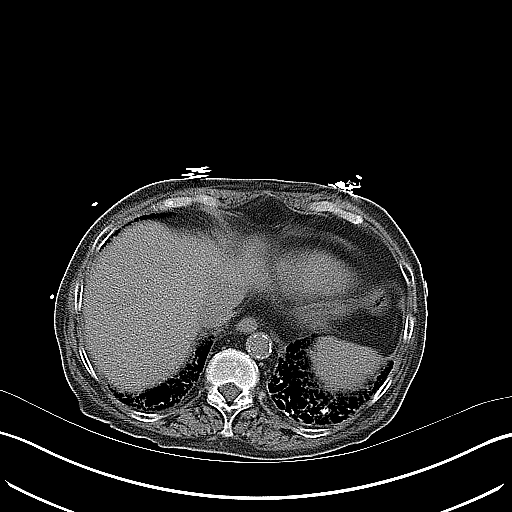
[im 7/14  soft-tissue]
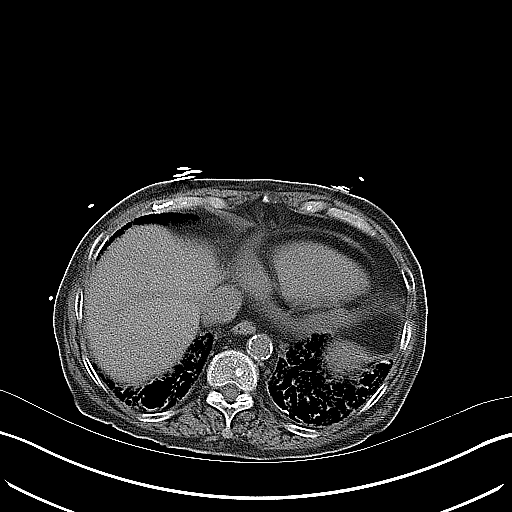
[im 8/14  soft-tissue]
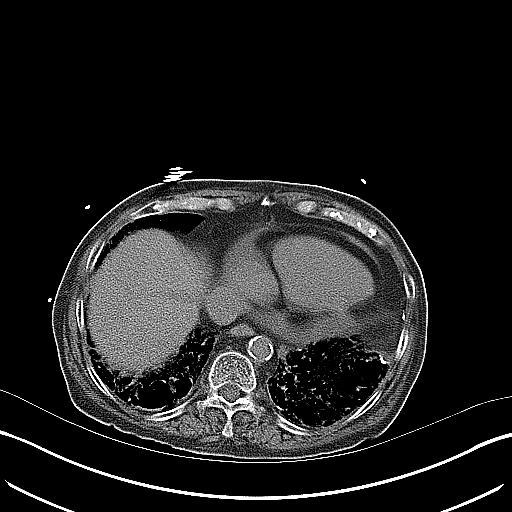
[im 9/14  soft-tissue]
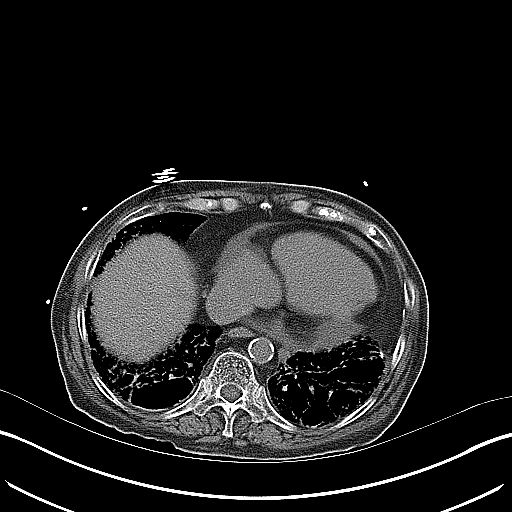
[im 10/14  soft-tissue]
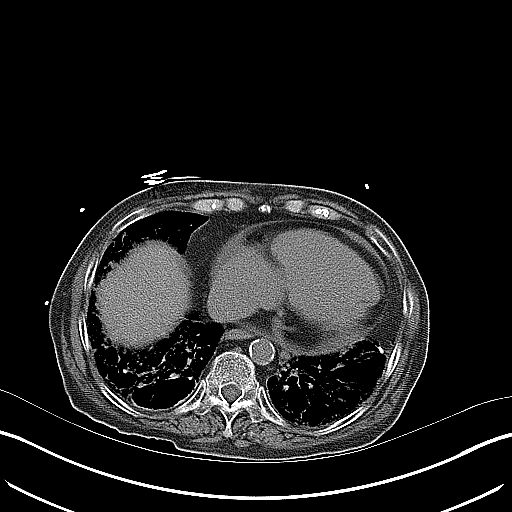
[im 10/14  bone]
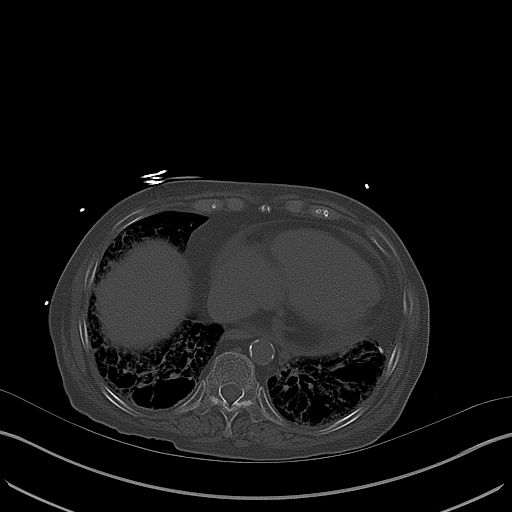
[im 11/14  soft-tissue]
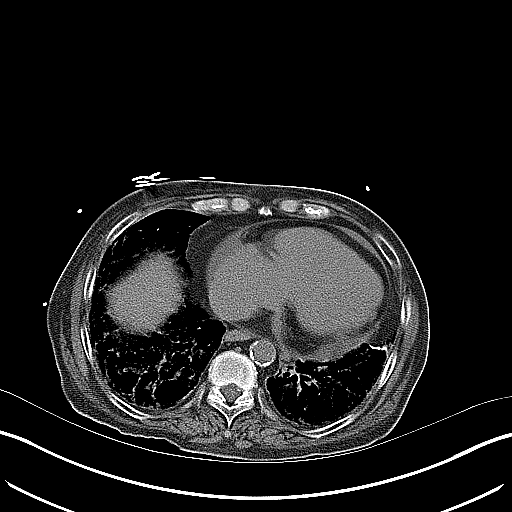
[im 12/14  soft-tissue]
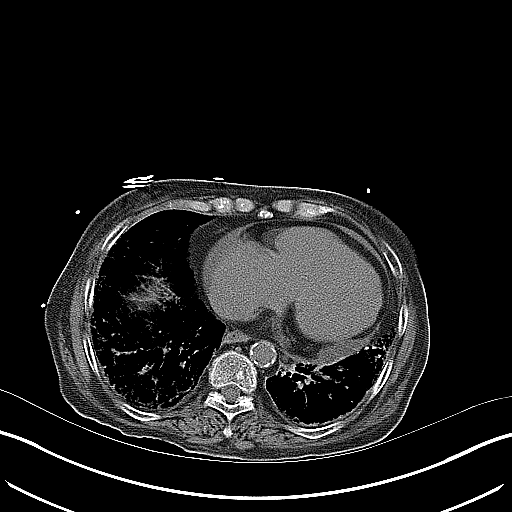
[im 13/14  soft-tissue]
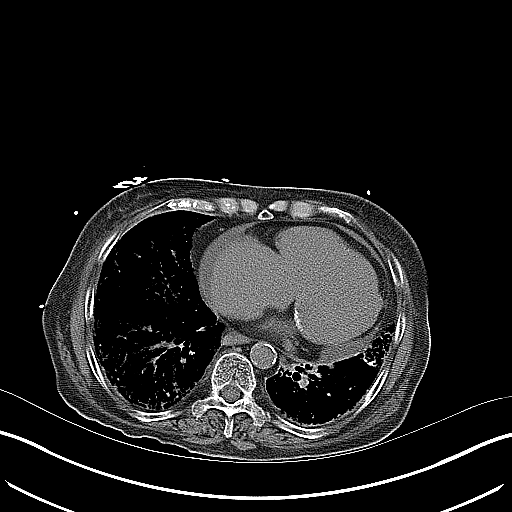

[15 of 28 positions shown; findings below may reference images not displayed]

FINDINGS: Lower chest: Bilateral lower lung chronic interstitial lung disease
with interstitial fibrosis. Trace pericardial effusion.
Cardiomegaly. Coronary artery atherosclerosis in the circumflex.

Hepatobiliary: Normal liver.  Normal gallbladder.

Pancreas: Normal.

Spleen: Normal.

Adrenals/Urinary Tract: Normal adrenal glands. Normal kidneys. No
urolithiasis or obstructive uropathy. Normal bladder.

Stomach/Bowel: No bowel wall thickening or dilatation. No
pneumatosis, pneumoperitoneum or portal venous gas. Moderate amount
of stool throughout the colon. Prior appendectomy. No abdominal or
pelvic free fluid.

Vascular/Lymphatic: Normal caliber abdominal aorta. Aorto-bifemoral
bypass graft.

Reproductive: Prior hysterectomy.

Other: No fluid collection or hematoma.

Musculoskeletal: No acute osseous abnormality. No lytic or sclerotic
osseous lesion. Bilateral facet arthropathy at L4-5 and L5-S1.
IMPRESSION: 1. No acute abdominal or pelvic pathology.
2. Bilateral lower lobe chronic interstitial lung disease with
interstitial fibrosis.
3. Trace pericardial effusion.

## 2016-02-25 IMAGING — CR DG CHEST 2V
2 series · 2 of 2 positions shown · non-contrast
Comparison: Chest radiograph performed 10/22/2015

CLINICAL DATA: Acute onset of shortness of breath. Initial
encounter.

EXAM:
CHEST  2 VIEW

[w chest lat]
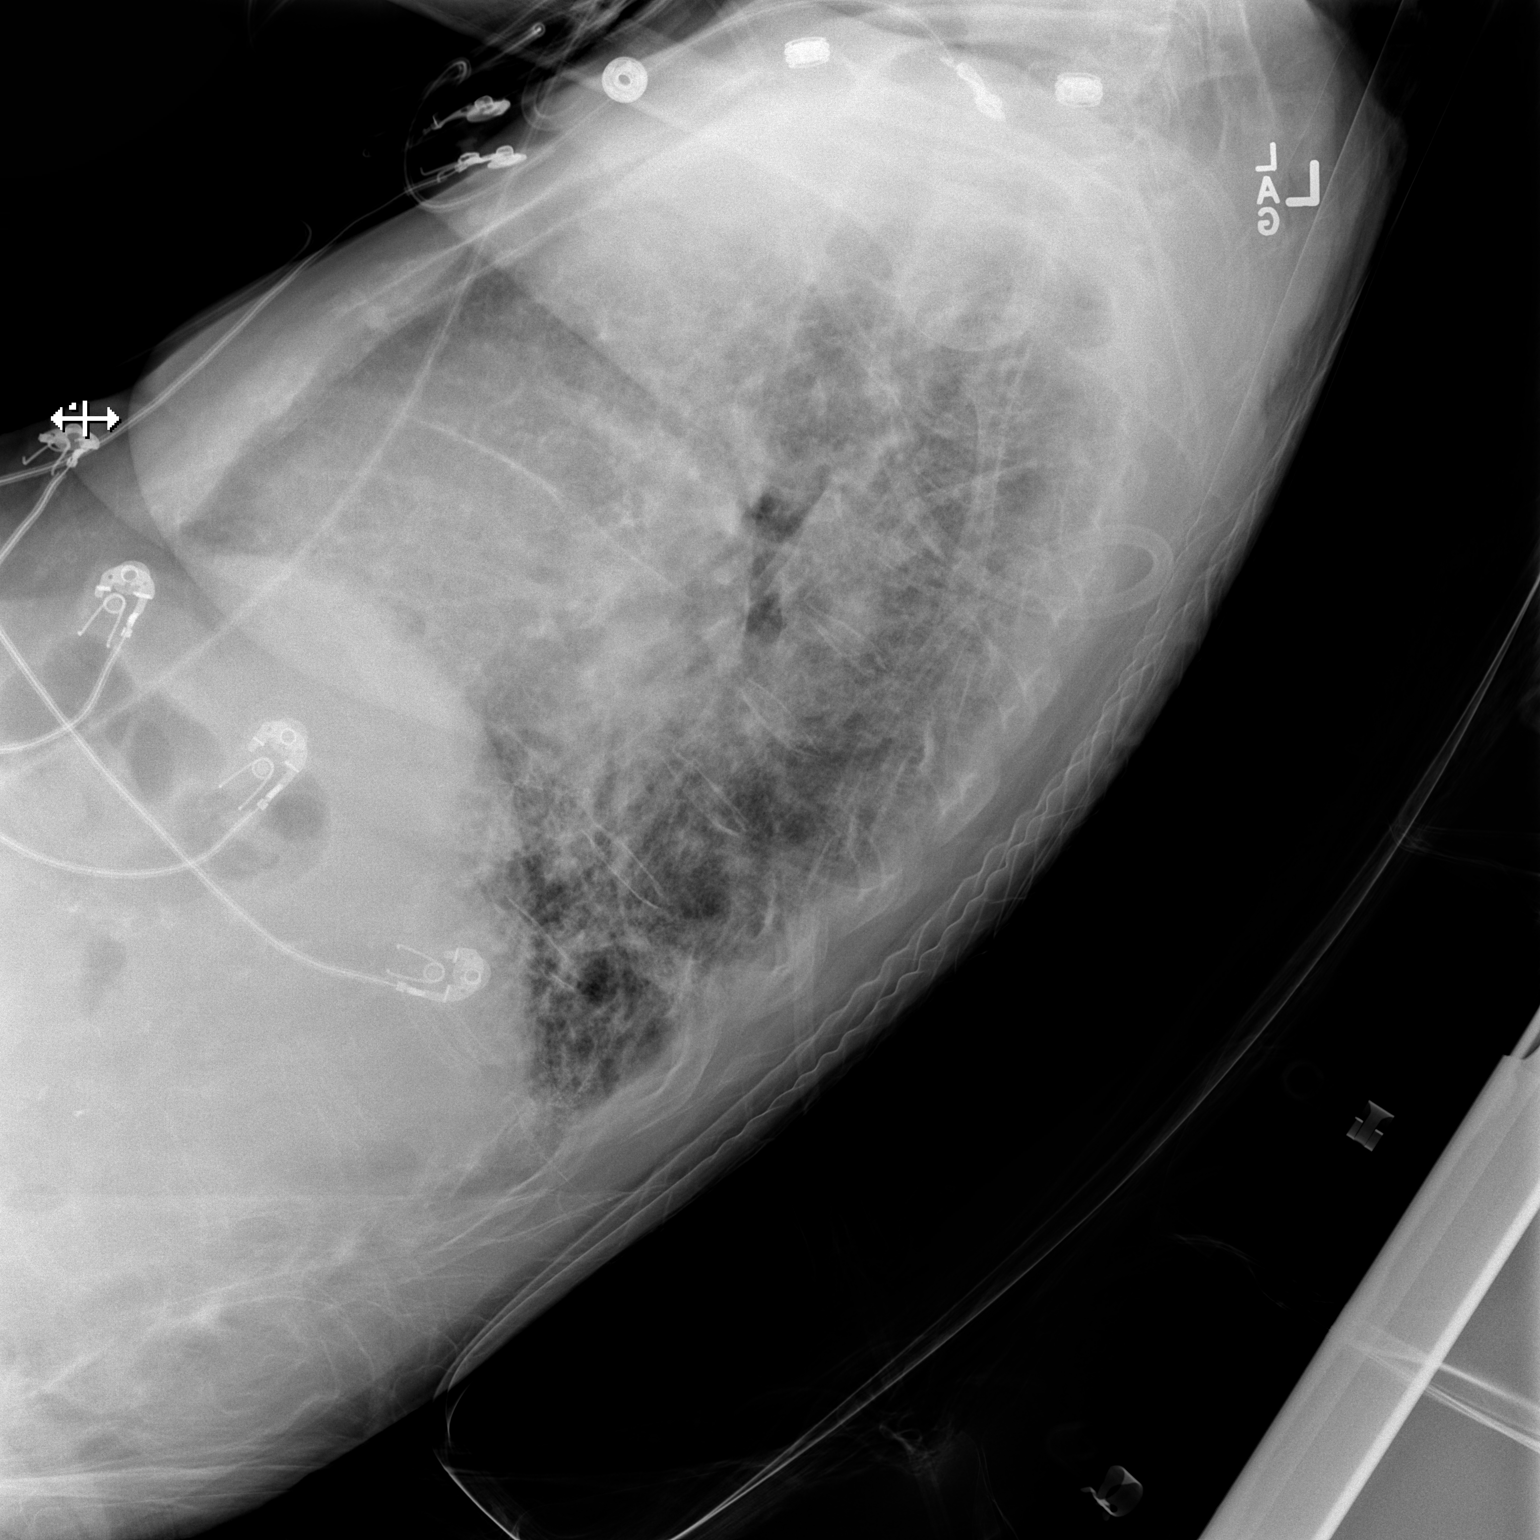

[x chest ap]
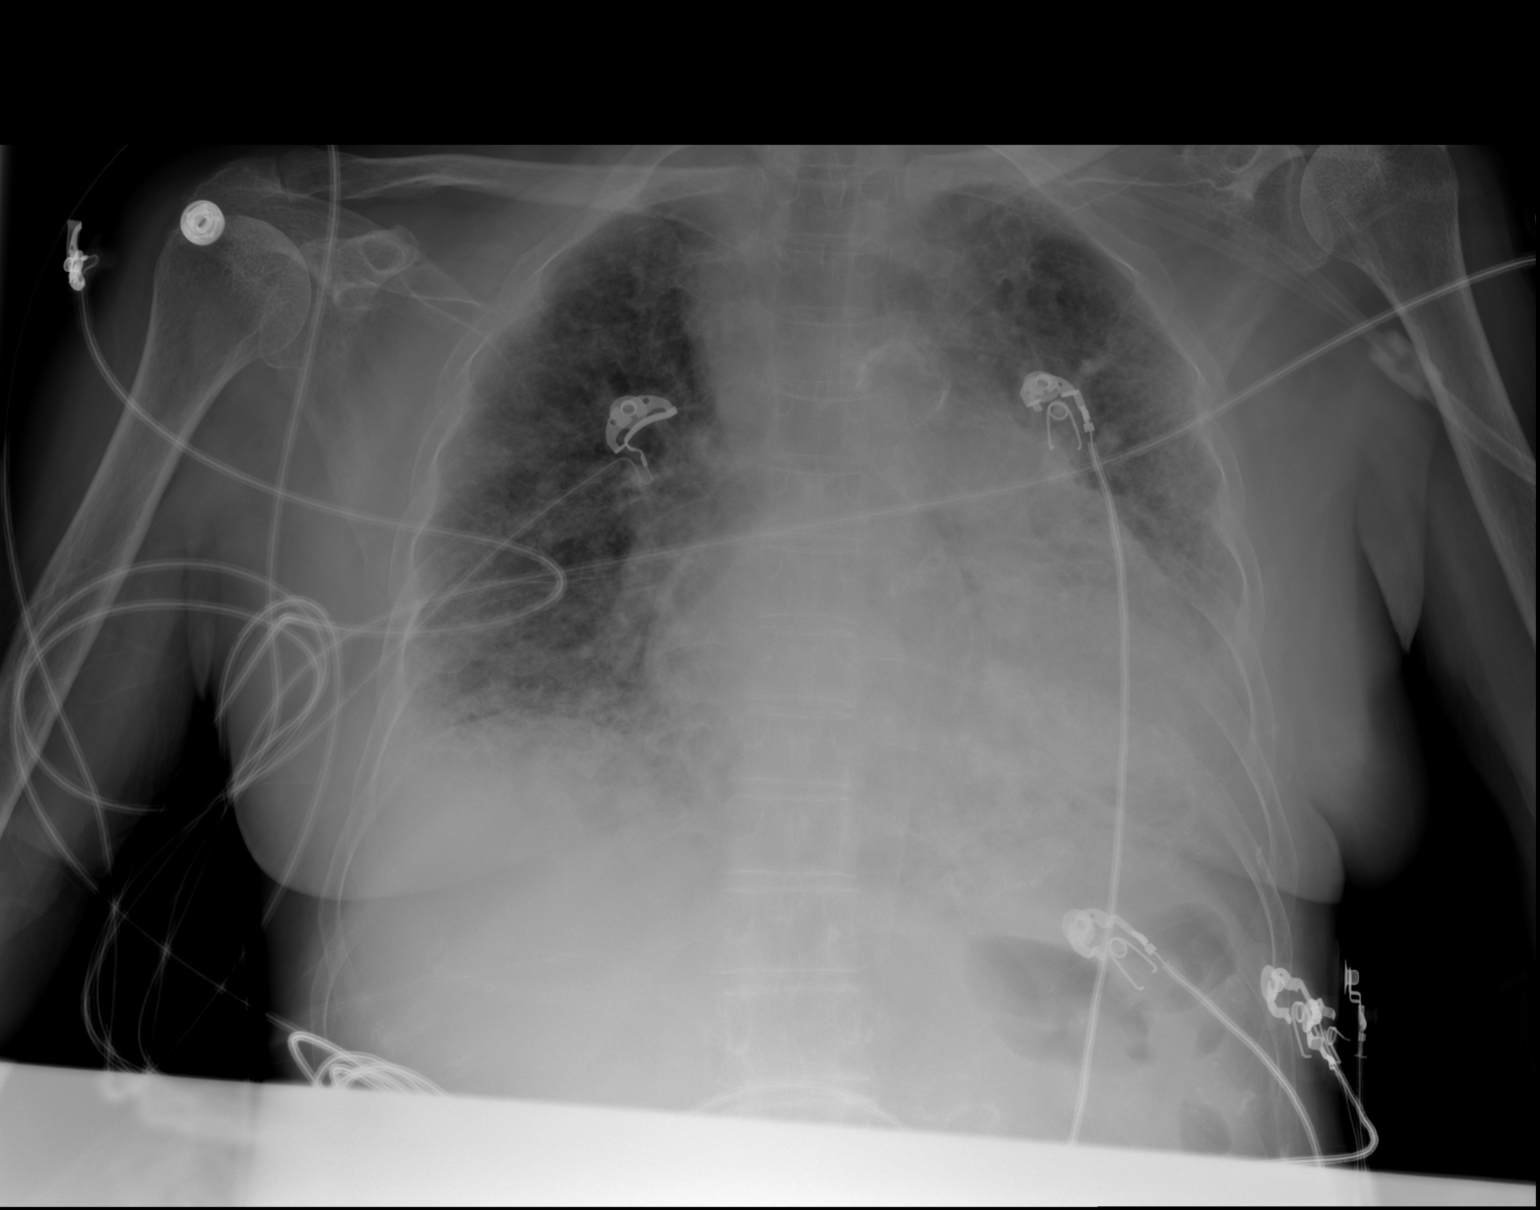

[2 of 2 positions shown; findings below may reference images not displayed]

FINDINGS: The lungs are well-aerated. Mild vascular congestion is noted. Mild
bibasilar opacities may reflect mild interstitial edema. Small
bilateral pleural effusions are suspected. No pneumothorax is seen.

The heart is mildly enlarged. No acute osseous abnormalities are
seen.
IMPRESSION: Mild vascular congestion and mild cardiomegaly. Mild bibasilar
opacities may reflect mild interstitial edema. Suspect small
bilateral pleural effusions.

## 2016-02-28 ENCOUNTER — Telehealth: Payer: Self-pay | Admitting: Pulmonary Disease

## 2016-02-28 MED ORDER — SULFAMETHOXAZOLE-TRIMETHOPRIM 800-160 MG PO TABS: ORAL_TABLET | ORAL | Status: AC

## 2016-02-28 NOTE — Telephone Encounter (Signed)
Bactrim refilled  I have LM for [name] [name], Hospice nurse to be made aware  Nothing further needed

## 2016-03-01 ENCOUNTER — Telehealth: Payer: Self-pay | Admitting: Pulmonary Disease

## 2016-03-03 ENCOUNTER — Telehealth: Payer: Self-pay

## 2016-03-03 NOTE — Telephone Encounter (Signed)
OK 

## 2016-03-03 NOTE — Telephone Encounter (Signed)
On 03/03/2016 I received a death certificate from [place] (orginal). The death certificate is for burial. The patient is a patient of Doctor McQuaid. The death certificate will be taken to [facility] ([id number]) this am for signature.  On 04-04-16 I received the death certificate back from Doctor McQuaid. I got the death certificate ready and called the funeral home to let them know the death certificate is ready for pickup.

## 2016-03-21 ENCOUNTER — Encounter (HOSPITAL_COMMUNITY): Payer: Medicare Other

## 2016-03-21 ENCOUNTER — Ambulatory Visit (HOSPITAL_COMMUNITY): Payer: Medicare Other

## 2016-03-21 ENCOUNTER — Ambulatory Visit: Payer: Medicare Other | Admitting: Family

## 2016-03-21 ENCOUNTER — Other Ambulatory Visit (HOSPITAL_COMMUNITY): Payer: Medicare Other

## 2016-03-30 NOTE — Telephone Encounter (Signed)
Spoke with Varney Biles, hospice nurse- calling to let us know that pt passed away this afternoon.   Forwarding to BQ as FYI.

## 2016-03-30 DEATH — deceased
